# Patient Record
Sex: Female | Born: 1995 | Race: Black or African American | Hispanic: No | Marital: Single | Smoking: Never smoker
Health system: Southern US, Community
[De-identification: ages and names within clinical notes are randomized; demographics above are authoritative.]

## PROBLEM LIST (undated history)

## (undated) DIAGNOSIS — J45909 Unspecified asthma, uncomplicated: Secondary | ICD-10-CM

## (undated) DIAGNOSIS — K219 Gastro-esophageal reflux disease without esophagitis: Secondary | ICD-10-CM

## (undated) DIAGNOSIS — J302 Other seasonal allergic rhinitis: Secondary | ICD-10-CM

## (undated) DIAGNOSIS — H539 Unspecified visual disturbance: Secondary | ICD-10-CM

## (undated) DIAGNOSIS — F410 Panic disorder [episodic paroxysmal anxiety] without agoraphobia: Secondary | ICD-10-CM

## (undated) DIAGNOSIS — E669 Obesity, unspecified: Secondary | ICD-10-CM

## (undated) DIAGNOSIS — F329 Major depressive disorder, single episode, unspecified: Secondary | ICD-10-CM

## (undated) DIAGNOSIS — F32A Depression, unspecified: Secondary | ICD-10-CM

## (undated) HISTORY — PX: TONSILLECTOMY: SUR1361

## (undated) HISTORY — DX: Gastro-esophageal reflux disease without esophagitis: K21.9

## (undated) HISTORY — PX: ADENOIDECTOMY: SUR15

---
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
## 2016-02-03 ENCOUNTER — Other Ambulatory Visit: Payer: Self-pay

## 2016-02-03 ENCOUNTER — Emergency Department (HOSPITAL_BASED_OUTPATIENT_CLINIC_OR_DEPARTMENT_OTHER)
Admission: EM | Admit: 2016-02-03 | Discharge: 2016-02-04 | Disposition: A | Discharge status: Home / Self Care | Payer: Medicaid Other | Attending: Emergency Medicine | Admitting: Emergency Medicine

## 2016-02-03 ENCOUNTER — Emergency Department (HOSPITAL_BASED_OUTPATIENT_CLINIC_OR_DEPARTMENT_OTHER): Payer: Medicaid Other

## 2016-02-03 ENCOUNTER — Encounter (HOSPITAL_BASED_OUTPATIENT_CLINIC_OR_DEPARTMENT_OTHER): Payer: Self-pay | Admitting: *Deleted

## 2016-02-03 DIAGNOSIS — F329 Major depressive disorder, single episode, unspecified: Secondary | ICD-10-CM | POA: Insufficient documentation

## 2016-02-03 DIAGNOSIS — R110 Nausea: Secondary | ICD-10-CM | POA: Diagnosis not present

## 2016-02-03 DIAGNOSIS — Z8669 Personal history of other diseases of the nervous system and sense organs: Secondary | ICD-10-CM | POA: Diagnosis not present

## 2016-02-03 DIAGNOSIS — Z79899 Other long term (current) drug therapy: Secondary | ICD-10-CM | POA: Diagnosis not present

## 2016-02-03 DIAGNOSIS — R079 Chest pain, unspecified: Secondary | ICD-10-CM | POA: Diagnosis present

## 2016-02-03 DIAGNOSIS — Z7951 Long term (current) use of inhaled steroids: Secondary | ICD-10-CM | POA: Diagnosis not present

## 2016-02-03 DIAGNOSIS — Z973 Presence of spectacles and contact lenses: Secondary | ICD-10-CM | POA: Insufficient documentation

## 2016-02-03 DIAGNOSIS — F410 Panic disorder [episodic paroxysmal anxiety] without agoraphobia: Secondary | ICD-10-CM | POA: Diagnosis not present

## 2016-02-03 DIAGNOSIS — J45901 Unspecified asthma with (acute) exacerbation: Secondary | ICD-10-CM | POA: Insufficient documentation

## 2016-02-03 DIAGNOSIS — E669 Obesity, unspecified: Secondary | ICD-10-CM | POA: Insufficient documentation

## 2016-02-03 MED ORDER — IPRATROPIUM-ALBUTEROL 0.5-2.5 (3) MG/3ML IN SOLN
3.0000 mL | RESPIRATORY_TRACT | Status: DC
  Administered 2016-02-04: 3 mL via RESPIRATORY_TRACT
  Filled 2016-02-03: qty 3

## 2016-02-03 NOTE — ED Notes (Signed)
Chest pain in the center of her chest, sob, nausea. [place] she got here. She is ambulatory with no difficulty.

## 2016-02-03 NOTE — ED Provider Notes (Signed)
CSN: [id number]     Arrival date & time 02/03/16  2141 History  By signing my name below, I, Jamie Stevens, attest that this documentation has been prepared under the direction and in the presence of Paula Libra, MD. Electronically Signed: Doreatha Stevens, ED Scribe. 02/03/2016. 11:50 PM.    Chief Complaint  Patient presents with  . Chest Pain   The history is provided by the patient. No language interpreter was used.   HPI Comments: Jamie Stevens is a 20 y.o. female with h/o asthma who presents to the Emergency Department complaining of moderate, intermittent substernal chest pain onset at 10AM. Associated symptoms include nausea, generalized weakness and exertional SOB. Pt describes her pain as dull and 6/10 in severity. Pt states her pain is unaffected by breathing, but is mildly worsened with movement. Pt reports she does not currently feel short of breath or nauseated and is currently pain-free. She reports h/o similar symptoms with asthma, but not as severe. She states that she did not try to use her home inhaler with her current symptoms. She denies chills, vomiting, diarrhea, leg pain or swelling.   Past Medical History  Diagnosis Date  . Asthma   . Seasonal allergies   . Panic anxiety syndrome   . Depression   . Obesity   . Vision abnormalities     Pt wears glasses   Past Surgical History  Procedure Laterality Date  . Tonsillectomy    . Adenoidectomy     No family history on file. Social History  Substance Use Topics  . Smoking status: Passive Smoke Exposure - Never Smoker  . Smokeless tobacco: None  . Alcohol Use: No   OB History    No data available     Review of Systems A complete 10 system review of systems was obtained and all systems are negative except as noted in the HPI and PMH.    Allergies  Review of patient's allergies indicates no known allergies.  Home Medications   Prior to Admission medications   Medication Sig Start Date End Date Taking? Authorizing  Provider  albuterol (PROVENTIL HFA;VENTOLIN HFA) 108 (90 BASE) MCG/ACT inhaler Inhale 1-2 puffs into the lungs every 4 (four) hours as needed for wheezing or shortness of breath. 09/03/14   Gerhard Munch, MD  budesonide-formoterol [facility]) 160-4.5 MCG/ACT inhaler Inhale 2 puffs into the lungs 2 (two) times daily.    Historical Provider, MD  buPROPion (WELLBUTRIN XL) 300 MG 24 hr tablet Take 1 tablet (300 mg total) by mouth daily. 08/27/15   Thedora Hinders, MD  cetirizine (ZYRTEC) 10 MG tablet Take 10 mg by mouth daily.    Historical Provider, MD   BP 139/98 mmHg  Pulse 104  Temp(Src) 99 F (37.2 C) (Oral)  Resp 18  Ht  (1.702 m)  Wt 273 lb 7 oz (124.03 kg)  BMI 42.82 kg/m2  SpO2 100%  LMP 02/02/2016   Physical Exam General: Well-developed, well-nourished female in no acute distress; appearance consistent with age of record; obese  HENT: normocephalic; atraumatic Eyes: pupils equal, round and reactive to light; extraocular muscles intact Neck: supple Heart: regular rate and rhythm; no murmurs, rubs or gallops Lungs: diminished breath sounds bilaterally without wheezes Abdomen: soft; nondistended; nontender; no masses or hepatosplenomegaly; bowel sounds present Extremities: No deformity; full range of motion; pulses normal Neurologic: Awake, alert and oriented; motor function intact in all extremities and symmetric; no facial droop Skin: Warm and dry Psychiatric: Normal mood and affect  ED Course  Procedures (including critical care time) DIAGNOSTIC STUDIES: Oxygen Saturation is 99% on RA, normal by my interpretation.    COORDINATION OF CARE: 11:44 PM Discussed treatment plan with pt at bedside which includes EKG, breathing tx and pt agreed to plan.    EKG Interpretation   Date/Time:  Friday February 03 2016 21:57:42 EDT Ventricular Rate:  108 PR Interval:  142 QRS Duration: 96 QT Interval:  330 QTC Calculation: 442 R Axis:   24 Text Interpretation:   Sinus tachycardia Nonspecific T wave abnormality  Abnormal ECG No previous ECGs available Confirmed by Read DriversMOLPUS  MD, Jonny RuizJOHN  [phone]([id number]) on 02/03/2016 11:41:40 PM      MDM   Nursing notes and vitals signs, including pulse oximetry, reviewed.  Summary of this visit's results, reviewed by myself:  Labs:  Results for orders placed or performed during the hospital encounter of 02/03/16 (from the past 24 hour(s))  CBC with Differential/Platelet     Status: Abnormal   Collection Time: 02/04/16 12:29 AM  Result Value Ref Range   WBC 6.8 4.0 - 10.5 K/uL   RBC 4.02 3.87 - 5.11 MIL/uL   Hemoglobin 11.2 (L) 12.0 - 15.0 g/dL   HCT 60.434.0 (L) 54.036.0 - 98.146.0 %   MCV 84.6 78.0 - 100.0 fL   MCH 27.9 26.0 - 34.0 pg   MCHC 32.9 30.0 - 36.0 g/dL   RDW 19.113.1 47.811.5 - 29.515.5 %   Platelets 209 150 - 400 K/uL   Neutrophils Relative % 42 %   Neutro Abs 2.9 1.7 - 7.7 K/uL   Lymphocytes Relative 47 %   Lymphs Abs 3.2 0.7 - 4.0 K/uL   Monocytes Relative 10 %   Monocytes Absolute 0.7 0.1 - 1.0 K/uL   Eosinophils Relative 1 %   Eosinophils Absolute 0.1 0.0 - 0.7 K/uL   Basophils Relative 0 %   Basophils Absolute 0.0 0.0 - 0.1 K/uL  Basic metabolic panel     Status: Abnormal   Collection Time: 02/04/16 12:29 AM  Result Value Ref Range   Sodium 141 135 - 145 mmol/L   Potassium 3.6 3.5 - 5.1 mmol/L   Chloride 108 101 - 111 mmol/L   CO2 25 22 - 32 mmol/L   Glucose, Bld 102 (H) 65 - 99 mg/dL   BUN 10 6 - 20 mg/dL   Creatinine, Ser 6.210.92 0.44 - 1.00 mg/dL   Calcium 9.3 8.9 - 30.810.3 mg/dL   GFR calc non Af Amer >60 >60 mL/min   GFR calc Af Amer >60 >60 mL/min   Anion gap 8 5 - 15  Troponin I     Status: None   Collection Time: 02/04/16 12:29 AM  Result Value Ref Range   Troponin I <0.03 <0.031 ng/mL    Imaging Studies: Dg Chest 2 View  02/04/2016  CLINICAL DATA:  Substernal chest pain beginning at 10 a.m. EXAM: CHEST  2 VIEW COMPARISON:  Chest radiograph September 03, 2014 FINDINGS: Cardiomediastinal silhouette is  normal. The lungs are clear without pleural effusions or focal consolidations. Trachea projects midline and there is no pneumothorax. Soft tissue planes and included osseous structures are non-suspicious. Large body habitus. IMPRESSION: Normal chest. Electronically Signed   By: Awilda Metroourtnay  Bloomer M.D.   On: 02/04/2016 01:06   1:11 AM Air movement improved after DuoNeb treatment. Patient has remained pain-free in the ED.  I personally performed the services described in this documentation, which was scribed in my presence. The recorded information has been reviewed  and is accurate.    Paula Libra, MD 02/04/16 [phone]

## 2016-02-04 LAB — CBC WITH DIFFERENTIAL/PLATELET
Basophils Absolute: 0 10*3/uL (ref 0.0–0.1)
Basophils Relative: 0 %
Eosinophils Absolute: 0.1 10*3/uL (ref 0.0–0.7)
Eosinophils Relative: 1 %
HCT: 34 % — ABNORMAL LOW (ref 36.0–46.0)
Hemoglobin: 11.2 g/dL — ABNORMAL LOW (ref 12.0–15.0)
Lymphocytes Relative: 47 %
Lymphs Abs: 3.2 10*3/uL (ref 0.7–4.0)
MCH: 27.9 pg (ref 26.0–34.0)
MCHC: 32.9 g/dL (ref 30.0–36.0)
MCV: 84.6 fL (ref 78.0–100.0)
Monocytes Absolute: 0.7 10*3/uL (ref 0.1–1.0)
Monocytes Relative: 10 %
Neutro Abs: 2.9 10*3/uL (ref 1.7–7.7)
Neutrophils Relative %: 42 %
Platelets: 209 10*3/uL (ref 150–400)
RBC: 4.02 MIL/uL (ref 3.87–5.11)
RDW: 13.1 % (ref 11.5–15.5)
WBC: 6.8 10*3/uL (ref 4.0–10.5)

## 2016-02-04 LAB — BASIC METABOLIC PANEL
Anion gap: 8 (ref 5–15)
BUN: 10 mg/dL (ref 6–20)
CO2: 25 mmol/L (ref 22–32)
Calcium: 9.3 mg/dL (ref 8.9–10.3)
Chloride: 108 mmol/L (ref 101–111)
Creatinine, Ser: 0.92 mg/dL (ref 0.44–1.00)
GFR calc Af Amer: >60 mL/min (ref >60)
GFR calc non Af Amer: >60 mL/min (ref >60)
Glucose, Bld: 102 mg/dL — ABNORMAL HIGH (ref 65–99)
Potassium: 3.6 mmol/L (ref 3.5–5.1)
Sodium: 141 mmol/L (ref 135–145)

## 2016-02-04 LAB — TROPONIN I: Troponin I: <0.03 ng/mL (ref <0.031)

## 2016-02-04 MED ORDER — ALBUTEROL SULFATE HFA 108 (90 BASE) MCG/ACT IN AERS
2.0000 | INHALATION_SPRAY | RESPIRATORY_TRACT | Status: DC | PRN
  Administered 2016-02-04: 2 via RESPIRATORY_TRACT
  Filled 2016-02-04: qty 6.7

## 2016-02-04 NOTE — Discharge Instructions (Signed)
Asthma, Adult Asthma is a recurring condition in which the airways tighten and narrow. Asthma can make it difficult to breathe. It can cause coughing, wheezing, and shortness of breath. Asthma episodes, also called asthma attacks, range from minor to life-threatening. Asthma cannot be cured, but medicines and lifestyle changes can help control it. CAUSES Asthma is believed to be caused by inherited (genetic) and environmental factors, but its exact cause is unknown. Asthma may be triggered by allergens, lung infections, or irritants in the air. Asthma triggers are different for each person. Common triggers include:   Animal dander.  Dust mites.  Cockroaches.  Pollen from trees or grass.  Mold.  Smoke.  Air pollutants such as dust, household cleaners, hair sprays, aerosol sprays, paint fumes, strong chemicals, or strong odors.  Cold air, weather changes, and winds (which increase molds and pollens in the air).  Strong emotional expressions such as crying or laughing hard.  Stress.  Certain medicines (such as aspirin) or types of drugs (such as beta-blockers).  Sulfites in foods and drinks. Foods and drinks that may contain sulfites include dried fruit, potato chips, and sparkling grape juice.  Infections or inflammatory conditions such as the flu, a cold, or an inflammation of the nasal membranes (rhinitis).  Gastroesophageal reflux disease (GERD).  Exercise or strenuous activity. SYMPTOMS Symptoms may occur immediately after asthma is triggered or many hours later. Symptoms include:  Wheezing.  Excessive nighttime or early morning coughing.  Frequent or severe coughing with a common cold.  Chest tightness.  Shortness of breath. DIAGNOSIS  The diagnosis of asthma is made by a review of your medical history and a physical exam. Tests may also be performed. These may include:  Lung function studies. These tests show how much air you breathe in and out.  Allergy  tests.  Imaging tests such as X-rays. TREATMENT  Asthma cannot be cured, but it can usually be controlled. Treatment involves identifying and avoiding your asthma triggers. It also involves medicines. There are 2 classes of medicine used for asthma treatment:   Controller medicines. These prevent asthma symptoms from occurring. They are usually taken every day.  Reliever or rescue medicines. These quickly relieve asthma symptoms. They are used as needed and provide short-term relief. Your health care provider will help you create an asthma action plan. An asthma action plan is a written plan for managing and treating your asthma attacks. It includes a list of your asthma triggers and how they may be avoided. It also includes information on when medicines should be taken and when their dosage should be changed. An action plan may also involve the use of a device called a peak flow meter. A peak flow meter measures how well the lungs are working. It helps you monitor your condition. HOME CARE INSTRUCTIONS   Take medicines only as directed by your health care provider. Speak with your health care provider if you have questions about how or when to take the medicines.  Use a peak flow meter as directed by your health care provider. Record and keep track of readings.  Understand and use the action plan to help minimize or stop an asthma attack without needing to seek medical care.  Control your home environment in the following ways to help prevent asthma attacks:  Do not smoke. Avoid being exposed to secondhand smoke.  Change your heating and air conditioning filter regularly.  Limit your use of fireplaces and wood stoves.  Get rid of pests (such as roaches   and mice) and their droppings.  Throw away plants if you see mold on them.  Clean your floors and dust regularly. Use unscented cleaning products.  Try to have someone else vacuum for you regularly. Stay out of rooms while they are  being vacuumed and for a short while afterward. If you vacuum, use a dust mask from a hardware store, a double-layered or microfilter vacuum cleaner bag, or a vacuum cleaner with a HEPA filter.  Replace carpet with wood, tile, or vinyl flooring. Carpet can trap dander and dust.  Use allergy-proof pillows, mattress covers, and box spring covers.  Wash bed sheets and blankets every week in hot water and dry them in a dryer.  Use blankets that are made of polyester or cotton.  Clean bathrooms and kitchens with bleach. If possible, have someone repaint the walls in these rooms with mold-resistant paint. Keep out of the rooms that are being cleaned and painted.  Wash hands frequently. SEEK MEDICAL CARE IF:   You have wheezing, shortness of breath, or a cough even if taking medicine to prevent attacks.  The colored mucus you cough up (sputum) is thicker than usual.  Your sputum changes from clear or white to yellow, green, gray, or bloody.  You have any problems that may be related to the medicines you are taking (such as a rash, itching, swelling, or trouble breathing).  You are using a reliever medicine more than 2-3 times per week.  Your peak flow is still at 50-79% of your personal best after following your action plan for 1 hour.  You have a fever. SEEK IMMEDIATE MEDICAL CARE IF:   You seem to be getting worse and are unresponsive to treatment during an asthma attack.  You are short of breath even at rest.  You get short of breath when doing very little physical activity.  You have difficulty eating, drinking, or talking due to asthma symptoms.  You develop chest pain.  You develop a fast heartbeat.  You have a bluish color to your lips or fingernails.  You are light-headed, dizzy, or faint.  Your peak flow is less than 50% of your personal best.   This information is not intended to replace advice given to you by your health care provider. Make sure you discuss any  questions you have with your health care provider.   Document Released: 10/15/2005 Document Revised: 07/06/2015 Document Reviewed: 05/14/2013 Elsevier Interactive Patient Education 2016 [employer]  

## 2018-01-28 ENCOUNTER — Emergency Department (HOSPITAL_BASED_OUTPATIENT_CLINIC_OR_DEPARTMENT_OTHER)
Admission: EM | Admit: 2018-01-28 | Discharge: 2018-01-28 | Disposition: A | Discharge status: Home / Self Care | Payer: Self-pay | Attending: Emergency Medicine | Admitting: Emergency Medicine

## 2018-01-28 ENCOUNTER — Encounter (HOSPITAL_BASED_OUTPATIENT_CLINIC_OR_DEPARTMENT_OTHER): Payer: Self-pay | Admitting: Emergency Medicine

## 2018-01-28 ENCOUNTER — Other Ambulatory Visit: Payer: Self-pay

## 2018-01-28 ENCOUNTER — Emergency Department (HOSPITAL_BASED_OUTPATIENT_CLINIC_OR_DEPARTMENT_OTHER): Payer: Self-pay

## 2018-01-28 DIAGNOSIS — R059 Cough, unspecified: Secondary | ICD-10-CM

## 2018-01-28 DIAGNOSIS — R05 Cough: Secondary | ICD-10-CM | POA: Insufficient documentation

## 2018-01-28 DIAGNOSIS — Z79899 Other long term (current) drug therapy: Secondary | ICD-10-CM | POA: Insufficient documentation

## 2018-01-28 DIAGNOSIS — J069 Acute upper respiratory infection, unspecified: Secondary | ICD-10-CM

## 2018-01-28 DIAGNOSIS — R0981 Nasal congestion: Secondary | ICD-10-CM | POA: Insufficient documentation

## 2018-01-28 DIAGNOSIS — J45909 Unspecified asthma, uncomplicated: Secondary | ICD-10-CM | POA: Insufficient documentation

## 2018-01-28 DIAGNOSIS — Z7722 Contact with and (suspected) exposure to environmental tobacco smoke (acute) (chronic): Secondary | ICD-10-CM | POA: Insufficient documentation

## 2018-01-28 DIAGNOSIS — J3489 Other specified disorders of nose and nasal sinuses: Secondary | ICD-10-CM | POA: Insufficient documentation

## 2018-01-28 NOTE — ED Triage Notes (Signed)
Pt c/o body aches and facial pressure since yesterday; sts just doesn't feel good.

## 2018-01-28 NOTE — Discharge Instructions (Signed)
Continue to stay well-hydrated. Gargle warm salt water and spit it out and use chloraseptic spray as needed for sore throat. Continue to alternate between Tylenol and Ibuprofen for pain or fever. Use Mucinex for cough suppression/expectoration of mucus. Use netipot and flonase to help with nasal congestion. May consider over-the-counter Benadryl or other antihistamine to decrease secretions and for help with your symptoms. Follow up with your primary care doctor in 5-7 days for recheck of ongoing symptoms. Return to emergency department for emergent changing or worsening of symptoms. °  °

## 2018-01-28 NOTE — ED Provider Notes (Signed)
[facility] EMERGENCY DEPARTMENT Provider Note   CSN: [id number] Arrival date & time: 01/28/18  1102     History   Chief Complaint Chief Complaint  Patient presents with  . Generalized Body Aches    HPI Jamie Stevens is a 22 y.o. female with a PMHx of asthma and seasonal allergies, who presents to the ED with complaints of URI symptoms x 1 day.  Symptoms include body aches, facial pressure/congestion, rhinorrhea, cough with yellow sputum production, and sore throat.  She has tried Benadryl and DayQuil with no relief of her symptoms, and being cold seems to aggravate her symptoms.  No known sick contacts, she does not think that she received the flu shot this year either.  She is a non-smoker.  She denies any drooling, trismus, ear pain or drainage, fevers, chills, CP, SOB, abd pain, N/V/D/C, hematuria, dysuria, arthralgias, numbness, tingling, focal weakness, rashes, or any other complaints at this time.   The history is provided by the patient and medical records. No language interpreter was used.  URI   This is a new problem. The current episode started yesterday. The problem has not changed since onset.There has been no fever. Associated symptoms include congestion, rhinorrhea, sore throat and cough. Pertinent negatives include no chest pain, no abdominal pain, no diarrhea, no nausea, no vomiting, no dysuria, no ear pain and no rash. She has tried other medications for the symptoms. The treatment provided no relief.    Past Medical History:  Diagnosis Date  . Asthma   . Depression   . Obesity   . Panic anxiety syndrome   . Seasonal allergies   . Vision abnormalities    Pt wears glasses    Patient Active Problem List   Diagnosis Date Noted  . 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 MDD (major depressive disorder) 08/22/2015    Past Surgical History:  Procedure Laterality Date  . ADENOIDECTOMY    . TONSILLECTOMY       OB History   None       Home Medications    Prior to Admission medications   Medication Sig Start Date End Date Taking? Authorizing Provider  albuterol (PROVENTIL HFA;VENTOLIN HFA) 108 (90 BASE) MCG/ACT inhaler Inhale 1-2 puffs into the lungs every 4 (four) hours as needed for wheezing or shortness of breath. 09/03/14   Gerhard MunchLockwood, Robert, MD  budesonide-formoterol [facility](SYMBICORT) 160-4.5 MCG/ACT inhaler Inhale 2 puffs into the lungs 2 (two) times daily.    [provider]  buPROPion (WELLBUTRIN XL) 300 MG 24 hr tablet Take 1 tablet (300 mg total) by mouth daily. 08/27/15   Thedora HindersSevilla Saez-Benito, Miriam, MD  cetirizine (ZYRTEC) 10 MG tablet Take 10 mg by mouth daily.    [provider]    Family History No family history on file.  Social History Social History   Tobacco Use  . Smoking status: Passive Smoke Exposure - Never Smoker  . Smokeless tobacco: Never Used  Substance Use Topics  . Alcohol use: No  . Drug use: No     Allergies   Patient has no known allergies.   Review of Systems Review of Systems  Constitutional: Negative for chills and fever.  HENT: Positive for congestion, rhinorrhea, sinus pressure and sore throat. Negative for drooling, ear discharge, ear pain and trouble swallowing.   Respiratory: Positive for cough. Negative for shortness of breath.   Cardiovascular: Negative for chest pain.  Gastrointestinal: Negative for abdominal pain, constipation, diarrhea, nausea and vomiting.  Genitourinary:  Negative for dysuria and hematuria.  Musculoskeletal: Positive for myalgias. Negative for arthralgias.  Skin: Negative for rash.  Allergic/Immunologic: Negative for immunocompromised state.  Neurological: Negative for weakness and numbness.  Psychiatric/Behavioral: Negative for confusion.   All other systems reviewed and are negative for acute change except as noted in the HPI.    Physical Exam Updated Vital Signs BP 132/88 (BP Location: Right Arm)   Pulse 97   Temp  98.9 F (37.2 C) (Oral)   Resp 20   Ht 5\' 6"  (1.676 m)   Wt 122.5 kg (270 lb)   LMP 01/16/2018   SpO2 96%   BMI 43.58 kg/m   Physical Exam  Constitutional: She is oriented to person, place, and time. Vital signs are normal. She appears well-developed and well-nourished.  Non-toxic appearance. No distress.  Afebrile, nontoxic, NAD  HENT:  Head: Normocephalic and atraumatic.  Nose: Mucosal edema and rhinorrhea present.  Mouth/Throat: Uvula is midline, oropharynx is clear and moist and mucous membranes are normal. No trismus in the jaw. No uvula swelling. Tonsils are 0 on the right. Tonsils are 0 on the left. No tonsillar exudate.  Nose congested. Oropharynx clear and moist, without uvular swelling or deviation, no trismus or drooling, tonsils surgically absent, no exudates.    Eyes: Conjunctivae and EOM are normal. Right eye exhibits no discharge. Left eye exhibits no discharge.  Neck: Normal range of motion. Neck supple.  Cardiovascular: Normal rate, regular rhythm, normal heart sounds and intact distal pulses. Exam reveals no gallop and no friction rub.  No murmur heard. Pulmonary/Chest: Effort normal and breath sounds normal. No respiratory distress. She has no decreased breath sounds. She has no wheezes. She has no rhonchi. She has no rales.  CTAB in all lung fields, no w/r/r, no hypoxia or increased WOB, speaking in full sentences, SpO2 96% on RA   Abdominal: Soft. Normal appearance and bowel sounds are normal. She exhibits no distension. There is no tenderness. There is no rigidity, no rebound, no guarding, no CVA tenderness, no tenderness at McBurney's point and negative Murphy's sign.  Musculoskeletal: Normal range of motion.  Neurological: She is alert and oriented to person, place, and time. She has normal strength. No sensory deficit.  Skin: Skin is warm, dry and intact. No rash noted.  Psychiatric: She has a normal mood and affect.  Nursing note and vitals reviewed.    ED  Treatments / Results  Labs (all labs ordered are listed, but only abnormal results are displayed) Labs Reviewed - No data to display  EKG None  Radiology Dg Chest 2 View  Result Date: 01/28/2018 CLINICAL DATA:  Body aches beginning yesterday. EXAM: CHEST - 2 VIEW COMPARISON:  02/04/2016 FINDINGS: Heart size is normal. Mediastinal shadows are normal. The lungs are clear. No bronchial thickening. No infiltrate, mass, effusion or collapse. Pulmonary vascularity is normal. No bony abnormality. IMPRESSION: Normal chest Electronically Signed   By: Paulina Fusi M.D.   On: 01/28/2018 12:52    Procedures Procedures (including critical care time)  Medications Ordered in ED Medications - No data to display   Initial Impression / Assessment and Plan / ED Course  I have reviewed the triage vital signs and the nursing notes.  Pertinent labs & imaging results that were available during my care of the patient were reviewed by me and considered in my medical decision making (see chart for details).     22 y.o. female here with URI symptoms x1 day, on exam pt afebrile  and nontoxic, clear lung exam, mild nasal congestion and rhinorrhea, throat clear. Could be flu, although no fever; will get CXR to eval for PNA/bronchitis, and reassess shortly.   1:26 PM CXR negative for PNA/bronchitis. Likely viral URI, could be flu although no fevers present so less likely, doubt tamiflu would be of benefit in this situation; pt agreeable with this plan. Advised OTC remedies for symptomatic relief, and f/up with PCP in 1wk for recheck. I explained the diagnosis and have given explicit precautions to return to the ER including for any other new or worsening symptoms. The patient understands and accepts the medical plan as it's been dictated and I have answered their questions. Discharge instructions concerning home care and prescriptions have been given. The patient is STABLE and is discharged to home in good condition.      Final Clinical Impressions(s) / ED Diagnoses   Final diagnoses:  Upper respiratory tract infection, unspecified type  Cough    ED Discharge Orders    [address] [place], [place] 01/28/18 1326    Loren Racer, MD 01/29/18 0730

## 2018-06-01 IMAGING — DX DG CHEST 2V
2 series · 2 of 2 positions shown · non-contrast
Comparison: 02/04/2016

CLINICAL DATA: Body aches beginning yesterday.

EXAM:
CHEST - 2 VIEW

[chest pa]
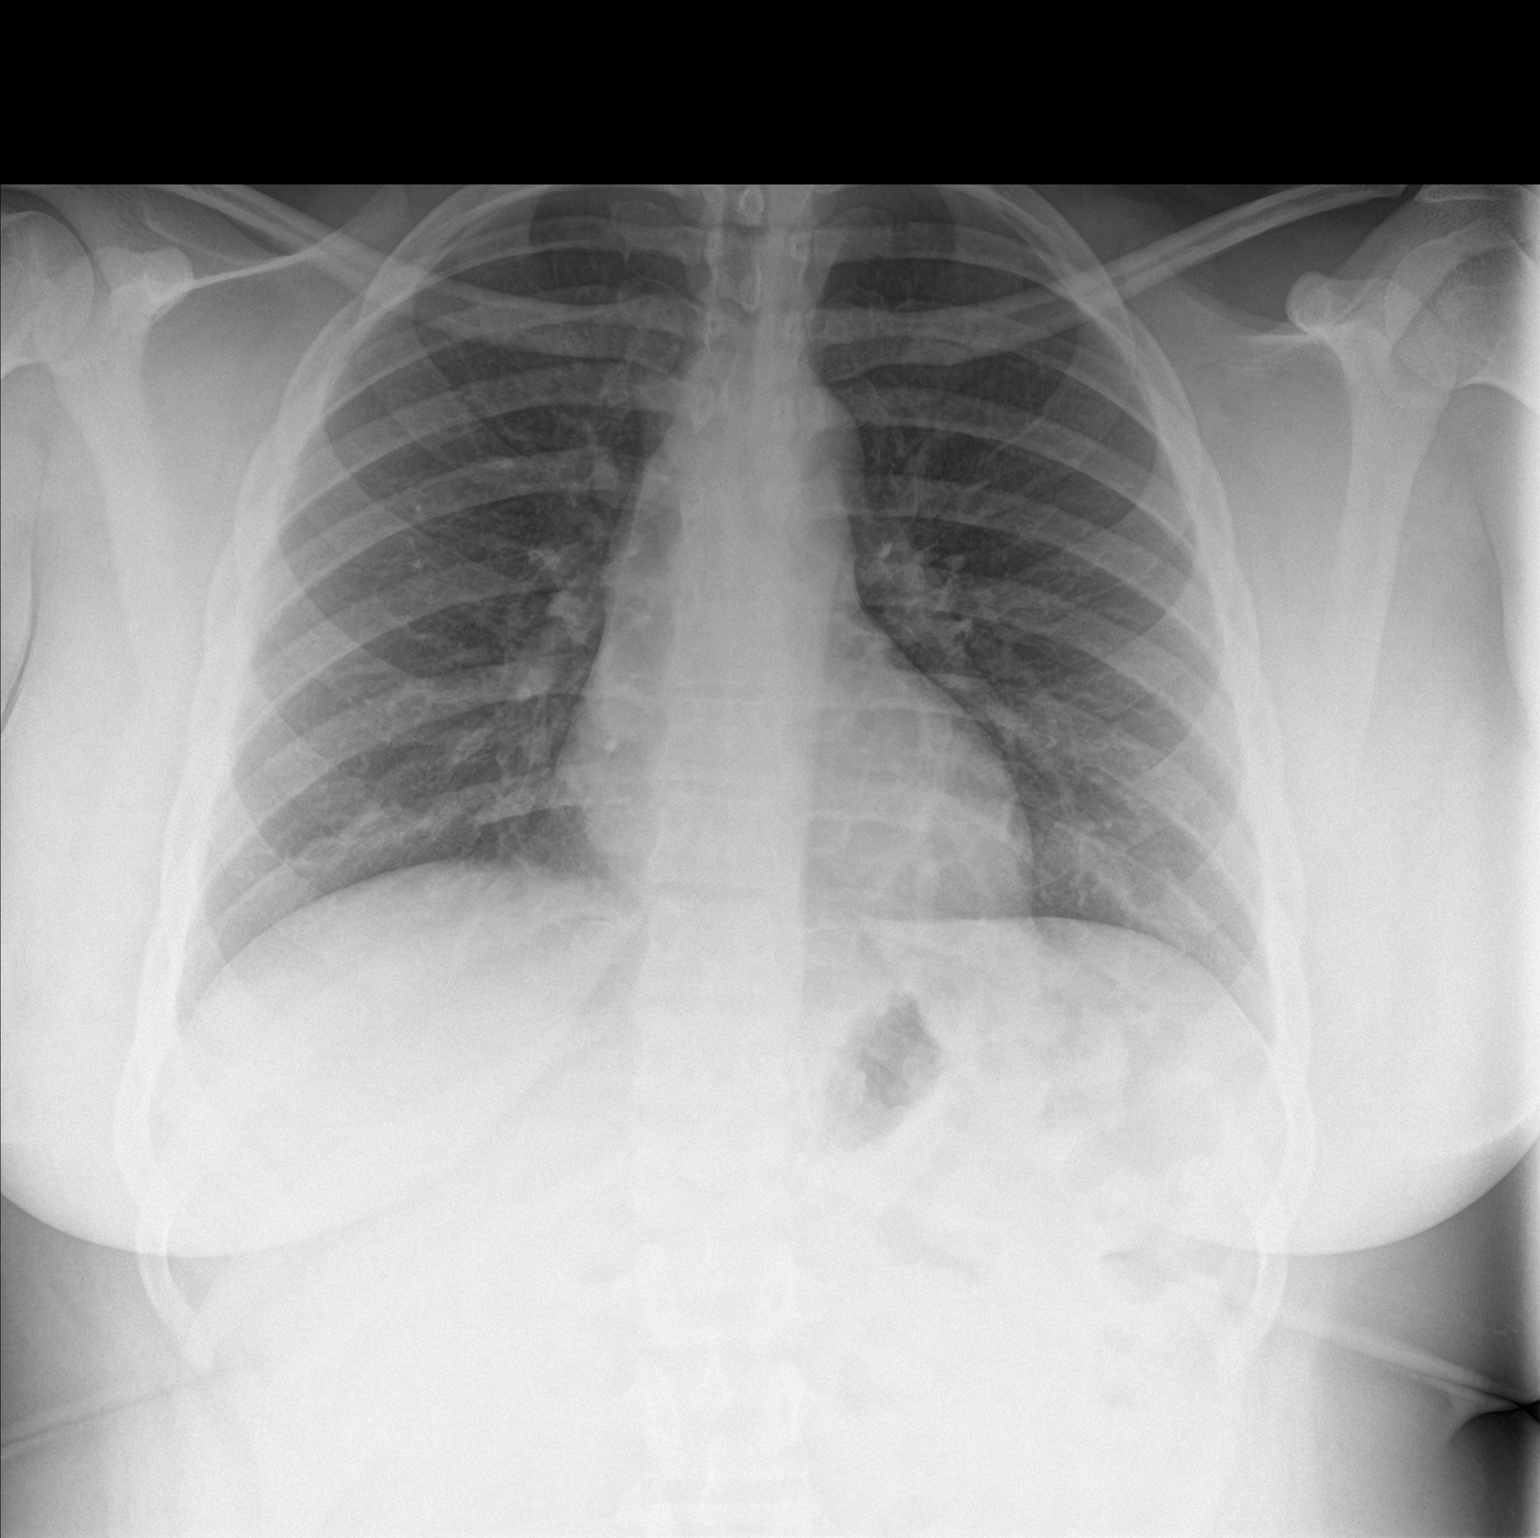

[chest lat]
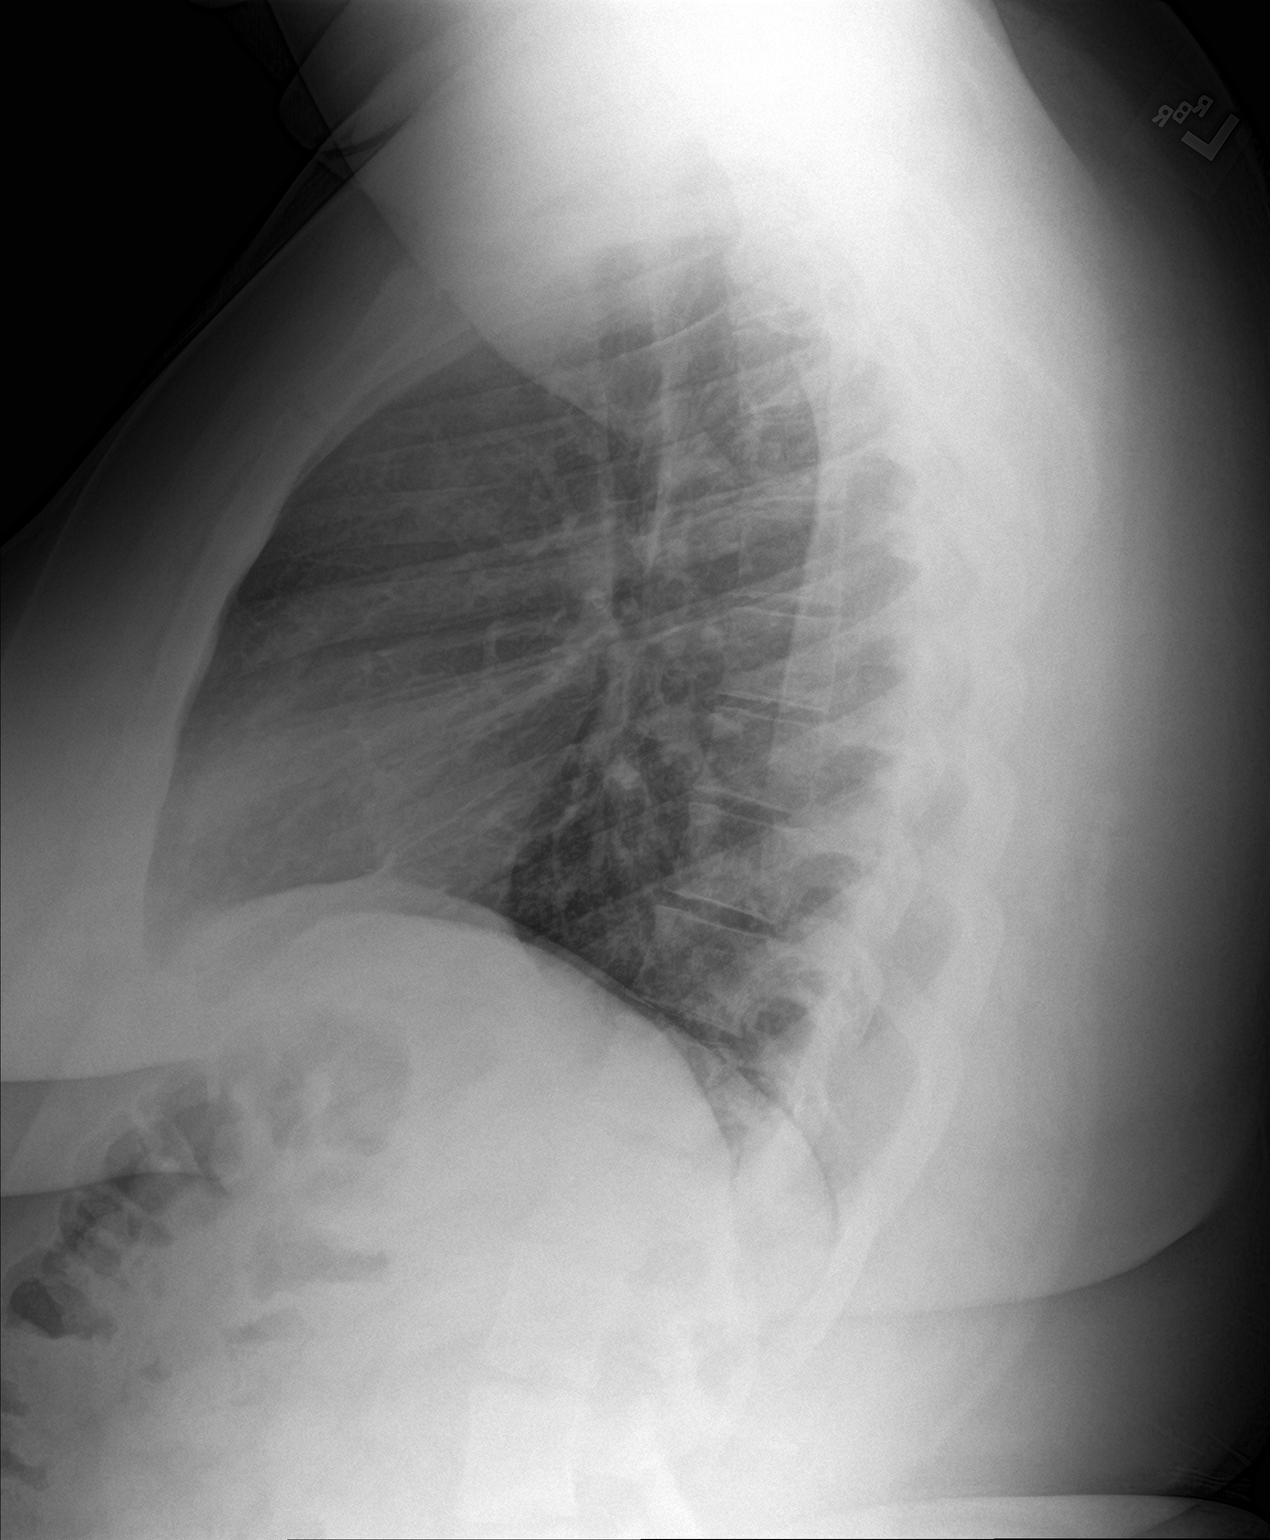

[2 of 2 positions shown; findings below may reference images not displayed]

FINDINGS: Heart size is normal. Mediastinal shadows are normal. The lungs are
clear. No bronchial thickening. No infiltrate, mass, effusion or
collapse. Pulmonary vascularity is normal. No bony abnormality.
IMPRESSION: Normal chest

## 2018-12-31 ENCOUNTER — Ambulatory Visit
Admission: EM | Admit: 2018-12-31 | Discharge: 2018-12-31 | Disposition: A | Discharge status: Home / Self Care | Payer: BLUE CROSS/BLUE SHIELD | Attending: Physician Assistant | Admitting: Physician Assistant

## 2018-12-31 DIAGNOSIS — B349 Viral infection, unspecified: Secondary | ICD-10-CM | POA: Diagnosis not present

## 2018-12-31 MED ORDER — ALBUTEROL SULFATE HFA 108 (90 BASE) MCG/ACT IN AERS: 1.0000 | INHALATION_SPRAY | RESPIRATORY_TRACT | 0 refills | Status: DC | PRN

## 2018-12-31 MED ORDER — FLUTICASONE PROPIONATE 50 MCG/ACT NA SUSP: 2.0000 | Freq: Every day | NASAL | 0 refills | Status: DC

## 2018-12-31 MED ORDER — BENZONATATE 100 MG PO CAPS: 100.0000 mg | ORAL_CAPSULE | Freq: Three times a day (TID) | ORAL | 0 refills | Status: DC

## 2018-12-31 MED ORDER — IPRATROPIUM BROMIDE 0.06 % NA SOLN: 2.0000 | Freq: Four times a day (QID) | NASAL | 12 refills | Status: DC

## 2018-12-31 NOTE — ED Provider Notes (Signed)
[facility]    CSN: [id number] Arrival date & time: 12/31/18  1123     History   Chief Complaint Chief Complaint  Patient presents with  . Nasal Congestion    HPI Jamie Stevens is a 23 y.o. female.   23 year old female comes in for 4 day history of URI symptoms. Sore throat, nasal congestion, sinus pressure. Now with cough, short of breath, chest congestion. States has to take more deep breaths while she is talking.  Denies fever, chills, night sweats.  Has still been eating and drinking without difficulty.  OTC cold medication without relief.  States has history of asthma, does not have any inhalers.  She was once on Symbicort, but self discontinued.     Past Medical History:  Diagnosis Date  . Asthma   . Depression   . Obesity   . Panic anxiety syndrome   . Seasonal allergies   . Vision abnormalities    Pt wears glasses    Patient Active Problem List   Diagnosis Date Noted  . 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 MDD (major depressive disorder) 08/22/2015    Past Surgical History:  Procedure Laterality Date  . ADENOIDECTOMY    . TONSILLECTOMY      OB History   No obstetric history on file.      Home Medications    Prior to Admission medications   Medication Sig Start Date End Date Taking? Authorizing Provider  albuterol (PROVENTIL HFA;VENTOLIN HFA) 108 (90 Base) MCG/ACT inhaler Inhale 1-2 puffs into the lungs every 4 (four) hours as needed for wheezing or shortness of breath. 12/31/18   Cathie Hoops,  V, PA-C  benzonatate (TESSALON) 100 MG capsule Take 1 capsule (100 mg total) by mouth every 8 (eight) hours. 12/31/18   Cathie Hoops,  V, PA-C  budesonide-formoterol (SYMBICORT) 160-4.5 MCG/ACT inhaler Inhale 2 puffs into the lungs 2 (two) times daily.    [provider]  buPROPion (WELLBUTRIN XL) 300 MG 24 hr tablet Take 1 tablet (300 mg total) by mouth daily. 08/27/15   Thedora Hinders, MD  fluticasone (FLONASE) 50 MCG/ACT  nasal spray Place 2 sprays into both nostrils daily. 12/31/18   Cathie Hoops,  V, PA-C  ipratropium (ATROVENT) 0.06 % nasal spray Place 2 sprays into both nostrils 4 (four) times daily. 12/31/18   Belinda Fisher, PA-C    Family History No family history on file.  Social History Social History   Tobacco Use  . Smoking status: Passive Smoke Exposure - Never Smoker  . Smokeless tobacco: Never Used  Substance Use Topics  . Alcohol use: No  . Drug use: No     Allergies   Patient has no known allergies.   Review of Systems Review of Systems  Reason unable to perform ROS: See HPI as above.     Physical Exam Triage Vital Signs ED Triage Vitals [12/31/18 1203]  Enc Vitals Group     BP (!) 143/81     Pulse Rate 73     Resp 18     Temp 98.5 F (36.9 C)     Temp Source Oral     SpO2 98 %     Weight      Height      Head Circumference      Peak Flow      Pain Score 0     Pain Loc      Pain Edu?      Excl. in GC?  No data found.  Updated Vital Signs BP (!) 143/81 (BP Location: Left Arm)   Pulse 73   Temp 98.5 F (36.9 C) (Oral)   Resp 18   LMP 12/31/2018   SpO2 98%   Physical Exam Constitutional:      General: She is not in acute distress.    Appearance: She is well-developed. She is not ill-appearing, toxic-appearing or diaphoretic.  HENT:     Head: Normocephalic and atraumatic.     Right Ear: Tympanic membrane, ear canal and external ear normal. Tympanic membrane is not erythematous or bulging.     Left Ear: Tympanic membrane, ear canal and external ear normal. Tympanic membrane is not erythematous or bulging.     Nose: Rhinorrhea present.     Right Sinus: No maxillary sinus tenderness or frontal sinus tenderness.     Left Sinus: No maxillary sinus tenderness or frontal sinus tenderness.     Mouth/Throat:     Mouth: Mucous membranes are moist.     Pharynx: Oropharynx is clear. Uvula midline.  Eyes:     Conjunctiva/sclera: Conjunctivae normal.     Pupils: Pupils are  equal, round, and reactive to light.  Neck:     Musculoskeletal: Normal range of motion and neck supple.  Cardiovascular:     Rate and Rhythm: Normal rate and regular rhythm.     Heart sounds: Normal heart sounds. No murmur. No friction rub. No gallop.   Pulmonary:     Effort: Pulmonary effort is normal. No accessory muscle usage, prolonged expiration, respiratory distress or retractions.     Breath sounds: Normal breath sounds. No stridor, decreased air movement or transmitted upper airway sounds. No decreased breath sounds, wheezing, rhonchi or rales.  Skin:    General: Skin is warm and dry.  Neurological:     Mental Status: She is alert and oriented to person, place, and time.      UC Treatments / Results  Labs (all labs ordered are listed, but only abnormal results are displayed) Labs Reviewed - No data to display  EKG None  Radiology No results found.  Procedures Procedures (including critical care time)  Medications Ordered in UC Medications - No data to display  Initial Impression / Assessment and Plan / UC Course  I have reviewed the triage vital signs and the nursing notes.  Pertinent labs & imaging results that were available during my care of the patient were reviewed by me and considered in my medical decision making (see chart for details).    Discussed with patient history and exam most consistent with viral URI. Symptomatic treatment as needed. Push fluids. Return precautions given.   Final Clinical Impressions(s) / UC Diagnoses   Final diagnoses:  Viral illness    ED Prescriptions    Medication Sig Dispense Auth. Provider   albuterol (PROVENTIL HFA;VENTOLIN HFA) 108 (90 Base) MCG/ACT inhaler Inhale 1-2 puffs into the lungs every 4 (four) hours as needed for wheezing or shortness of breath. 3.7 g ,  V, PA-C   ipratropium (ATROVENT) 0.06 % nasal spray Place 2 sprays into both nostrils 4 (four) times daily. 15 mL ,  V, PA-C   fluticasone  (FLONASE) 50 MCG/ACT nasal spray Place 2 sprays into both nostrils daily. 1 g ,  V, PA-C   benzonatate (TESSALON) 100 MG capsule Take 1 capsule (100 mg total) by mouth every 8 (eight) hours. 21 capsule Cathie Hoops,  V, PA-C        Cathie Hoops  V, PA-C  12/31/18 1235  

## 2018-12-31 NOTE — ED Triage Notes (Signed)
Pt c/o nasal/head/chest congestion since Monday, worse today, having SOB

## 2018-12-31 NOTE — Discharge Instructions (Signed)
Tessalon for cough. Albuterol as needed for shortness of breath, wheezing. Start flonase, atrovent nasal spray for nasal congestion/drainage. You can use over the counter nasal saline rinse such as neti pot for nasal congestion. Keep hydrated, your urine should be clear to pale yellow in color. Tylenol/motrin for fever and pain. Monitor for any worsening of symptoms, chest pain, shortness of breath, wheezing, swelling of the throat, follow up for reevaluation.

## 2020-12-11 ENCOUNTER — Ambulatory Visit
Admission: EM | Admit: 2020-12-11 | Discharge: 2020-12-11 | Disposition: A | Discharge status: Home / Self Care | Payer: 59 | Attending: Family Medicine | Admitting: Family Medicine

## 2020-12-11 ENCOUNTER — Other Ambulatory Visit: Payer: Self-pay

## 2020-12-11 DIAGNOSIS — M25562 Pain in left knee: Secondary | ICD-10-CM | POA: Diagnosis present

## 2020-12-11 DIAGNOSIS — M25561 Pain in right knee: Secondary | ICD-10-CM | POA: Diagnosis not present

## 2020-12-11 DIAGNOSIS — N760 Acute vaginitis: Secondary | ICD-10-CM

## 2020-12-11 DIAGNOSIS — R5383 Other fatigue: Secondary | ICD-10-CM | POA: Diagnosis present

## 2020-12-11 DIAGNOSIS — L02415 Cutaneous abscess of right lower limb: Secondary | ICD-10-CM

## 2020-12-11 DIAGNOSIS — R630 Anorexia: Secondary | ICD-10-CM

## 2020-12-11 LAB — POCT URINALYSIS DIP (MANUAL ENTRY)
Glucose, UA: NEGATIVE mg/dL
Ketones, POC UA: NEGATIVE mg/dL
Leukocytes, UA: NEGATIVE
Nitrite, UA: NEGATIVE
Protein Ur, POC: 100 mg/dL — AB
Spec Grav, UA: >=1.03 — AB (ref 1.010–1.025)
Urobilinogen, UA: 0.2 E.U./dL
pH, UA: 5.5 (ref 5.0–8.0)

## 2020-12-11 MED ORDER — NAPROXEN 500 MG PO TABS: 500.0000 mg | ORAL_TABLET | Freq: Two times a day (BID) | ORAL | 0 refills | Status: DC | PRN

## 2020-12-11 MED ORDER — CEPHALEXIN 500 MG PO CAPS: 500.0000 mg | ORAL_CAPSULE | Freq: Two times a day (BID) | ORAL | 0 refills | Status: DC

## 2020-12-11 MED ORDER — HIBICLENS 4 % EX LIQD: Freq: Every day | CUTANEOUS | 0 refills | Status: DC | PRN

## 2020-12-11 MED ORDER — FLUCONAZOLE 150 MG PO TABS: 150.0000 mg | ORAL_TABLET | Freq: Once | ORAL | 0 refills | Status: AC

## 2020-12-11 NOTE — ED Provider Notes (Signed)
[facility]    CSN: [id number] Arrival date & time: 12/11/20  1233      History   Chief Complaint Chief Complaint  Patient presents with  . Leg Pain    Bilaterally x 4 days  . Groin Swelling    X 4 days    HPI Jamie Stevens is a 25 y.o. female.   Here today with leg aching diffusely, hip pain, lower back back, fatigue for about 4 days now. Also having a mass in right groin area that is growing and painful. Denies fever, chills, abdominal pain, N/V/D, CP, SOB. Has been having some vaginal discharge as well but does not feel this is related to the other sxs. Not trying anything OTC for any of these sxs. No known hx of similar issues regarding her diffuse body aches.      Past Medical History:  Diagnosis Date  . Asthma   . Depression   . Obesity   . Panic anxiety syndrome   . Seasonal allergies   . Vision abnormalities    Pt wears glasses    Patient Active Problem List   Diagnosis Date Noted  . 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 MDD (major depressive disorder) 08/22/2015    Past Surgical History:  Procedure Laterality Date  . ADENOIDECTOMY    . TONSILLECTOMY      OB History   No obstetric history on file.      Home Medications    Prior to Admission medications   Medication Sig Start Date End Date Taking? Authorizing Provider  albuterol (PROVENTIL HFA;VENTOLIN HFA) 108 (90 Base) MCG/ACT inhaler Inhale 1-2 puffs into the lungs every 4 (four) hours as needed for wheezing or shortness of breath. 12/31/18  Yes Yu, Amy V, PA-C  cephALEXin (KEFLEX) 500 MG capsule Take 1 capsule (500 mg total) by mouth 2 (two) times daily. 12/11/20  Yes Particia Nearing, PA-C  chlorhexidine (HIBICLENS) 4 % external liquid Apply topically daily as needed. 12/11/20  Yes Particia Nearing, PA-C  naproxen (NAPROSYN) 500 MG tablet Take 1 tablet (500 mg total) by mouth 2 (two) times daily as needed. 12/11/20  Yes Particia Nearing, PA-C  benzonatate  (TESSALON) 100 MG capsule Take 1 capsule (100 mg total) by mouth every 8 (eight) hours. 12/31/18   Cathie Hoops, Amy V, PA-C  budesonide-formoterol (SYMBICORT) 160-4.5 MCG/ACT inhaler Inhale 2 puffs into the lungs 2 (two) times daily.    [provider]  buPROPion (WELLBUTRIN XL) 300 MG 24 hr tablet Take 1 tablet (300 mg total) by mouth daily. 08/27/15   Thedora Hinders, MD  fluticasone (FLONASE) 50 MCG/ACT nasal spray Place 2 sprays into both nostrils daily. 12/31/18   Cathie Hoops, Amy V, PA-C  ipratropium (ATROVENT) 0.06 % nasal spray Place 2 sprays into both nostrils 4 (four) times daily. 12/31/18   Cathie Hoops, Amy V, PA-C  metroNIDAZOLE (FLAGYL) 500 MG tablet Take 1 tablet (500 mg total) by mouth 2 (two) times daily. 12/12/20   Lamptey, Britta Mccreedy, MD    Family History History reviewed. No pertinent family history.  Social History Social History   Tobacco Use  . Smoking status: Passive Smoke Exposure - Never Smoker  . Smokeless tobacco: Never Used  Vaping Use  . Vaping Use: Never used  Substance Use Topics  . Alcohol use: No  . Drug use: No     Allergies   Patient has no known allergies.   Review of Systems Review of Systems PER HPI  Physical Exam Triage Vital Signs ED Triage Vitals  Enc Vitals Group     BP 12/11/20 1333 112/72     Pulse Rate 12/11/20 1333 80     Resp 12/11/20 1333 20     Temp 12/11/20 1333 98.2 F (36.8 C)     Temp Source 12/11/20 1333 Oral     SpO2 12/11/20 1333 98 %     Weight --      Height --      Head Circumference --      Peak Flow --      Pain Score 12/11/20 1408 0     Pain Loc --      Pain Edu? --      Excl. in GC? --    No data found.  Updated Vital Signs BP 112/72 (BP Location: Left Arm)   Pulse 80   Temp 98.2 F (36.8 C) (Oral)   Resp 20   LMP 12/06/2020 (Approximate)   SpO2 98%   Visual Acuity Right Eye Distance:   Left Eye Distance:   Bilateral Distance:    Right Eye Near:   Left Eye Near:    Bilateral Near:     Physical  Exam Vitals and nursing note reviewed. Exam conducted with a chaperone present.  Constitutional:      Appearance: Normal appearance. She is not ill-appearing.  HENT:     Head: Atraumatic.     Right Ear: Tympanic membrane normal.     Left Ear: Tympanic membrane normal.     Nose: Nose normal.     Mouth/Throat:     Mouth: Mucous membranes are moist.     Pharynx: Oropharynx is clear. No posterior oropharyngeal erythema.  Eyes:     Extraocular Movements: Extraocular movements intact.     Conjunctiva/sclera: Conjunctivae normal.  Cardiovascular:     Rate and Rhythm: Normal rate and regular rhythm.     Heart sounds: Normal heart sounds.  Pulmonary:     Effort: Pulmonary effort is normal.     Breath sounds: Normal breath sounds.  Abdominal:     General: Bowel sounds are normal. There is no distension.     Palpations: Abdomen is soft.     Tenderness: There is no abdominal tenderness. There is no right CVA tenderness, left CVA tenderness or guarding.  Genitourinary:    Vagina: Vaginal discharge present.  Musculoskeletal:        General: Normal range of motion.     Cervical back: Normal range of motion and neck supple.  Skin:    General: Skin is warm.     Comments: Small pustules forming in a group at right upper medial thigh/groin fold. No active drainage or large area of fluctuance  Neurological:     Mental Status: She is alert and oriented to person, place, and time.  Psychiatric:        Mood and Affect: Mood normal.        Thought Content: Thought content normal.        Judgment: Judgment normal.      UC Treatments / Results  Labs (all labs ordered are listed, but only abnormal results are displayed) Labs Reviewed  COMPREHENSIVE METABOLIC PANEL - Abnormal; Notable for the following components:      Result Value   CO2 19 (*)    All other components within normal limits   Narrative:    Performed at:  [address], [place], [place]  [id number] Lab  Director: Jolene Schimke MD, Phone:  [phone]  POCT URINALYSIS DIP (MANUAL ENTRY) - Abnormal; Notable for the following components:   Color, UA straw (*)    Clarity, UA hazy (*)    Bilirubin, UA small (*)    Spec Grav, UA >=1.030 (*)    Blood, UA trace-intact (*)    Protein Ur, POC =100 (*)    All other components within normal limits  CERVICOVAGINAL ANCILLARY ONLY - Abnormal; Notable for the following components:   Bacterial Vaginitis (gardnerella) Positive (*)    Trichomonas Positive (*)    All other components within normal limits  CBC WITH DIFFERENTIAL/PLATELET   Narrative:    Performed at:  [address] [place], [place]  [id number] Lab Director: Jolene Schimke MD, Phone:  [phone]    EKG   Radiology No results found.  Procedures Procedures (including critical care time)  Medications Ordered in UC Medications - No data to display  Initial Impression / Assessment and Plan / UC Course  I have reviewed the triage vital signs and the nursing notes.  Pertinent labs & imaging results that were available during my care of the patient were reviewed by me and considered in my medical decision making (see chart for details).     Sxs quite generalized and seem to not necessarily be all connected. Will send aptima swab, U/A for eval of vaginal discharge and treat with diflucan while waiting for results given appearance. COVID pcr, basic labs for generalized fatigue and body aches. Naproxen prn for this, fluids. Keflex and hibiclens for groin infection. Return for worsening sxs.   Final Clinical Impressions(s) / UC Diagnoses   Final diagnoses:  Decreased appetite  Arthralgia of both lower legs  Abscess of leg, right  Acute vaginitis   Discharge Instructions   None    ED Prescriptions    Medication Sig Dispense Auth. Provider   naproxen (NAPROSYN) 500 MG tablet Take 1 tablet (500 mg total) by mouth 2 (two) times daily as needed. 30 tablet  Particia Nearing, [place]   chlorhexidine (HIBICLENS) 4 % external liquid Apply topically daily as needed. 120 mL Particia Nearing, PA-C   cephALEXin (KEFLEX) 500 MG capsule Take 1 capsule (500 mg total) by mouth 2 (two) times daily. 14 capsule Particia Nearing, [place]   fluconazole (DIFLUCAN) 150 MG tablet Take 1 tablet (150 mg total) by mouth once for 1 dose. 1 tablet Particia Nearing, [place]     PDMP not reviewed this encounter.   Roosvelt Maser Brand[place], [place] 12/15/20 [phone]

## 2020-12-11 NOTE — ED Triage Notes (Signed)
Patient states she has been having bilateral thigh and hip pain x 4 days. Pt also complains of lower back pain for the same length of time. Pt states she has also felt fatigued. Pt complains of a bump or mass in her vaginal area as well as a rash and vaginal discharge. Pt is aox4 and ambulatory.

## 2020-12-12 ENCOUNTER — Telehealth (HOSPITAL_COMMUNITY): Payer: Self-pay | Admitting: Emergency Medicine

## 2020-12-12 LAB — CERVICOVAGINAL ANCILLARY ONLY
Bacterial Vaginitis (gardnerella): POSITIVE — AB
Candida Glabrata: NEGATIVE
Candida Vaginitis: NEGATIVE
Chlamydia: NEGATIVE
Comment: NEGATIVE
Comment: NEGATIVE
Comment: NEGATIVE
Comment: NEGATIVE
Comment: NEGATIVE
Comment: NORMAL
Neisseria Gonorrhea: NEGATIVE
Trichomonas: POSITIVE — AB

## 2020-12-12 LAB — CBC WITH DIFFERENTIAL/PLATELET
Basophils Absolute: 0 10*3/uL (ref 0.0–0.2)
Basos: 1 %
EOS (ABSOLUTE): 0.2 10*3/uL (ref 0.0–0.4)
Eos: 4 %
Hematocrit: 38.4 % (ref 34.0–46.6)
Hemoglobin: 12.7 g/dL (ref 11.1–15.9)
Immature Grans (Abs): 0 10*3/uL (ref 0.0–0.1)
Immature Granulocytes: 0 %
Lymphocytes Absolute: 2 10*3/uL (ref 0.7–3.1)
Lymphs: 42 %
MCH: 29.2 pg (ref 26.6–33.0)
MCHC: 33.1 g/dL (ref 31.5–35.7)
MCV: 88 fL (ref 79–97)
Monocytes Absolute: 0.5 10*3/uL (ref 0.1–0.9)
Monocytes: 10 %
Neutrophils Absolute: 2 10*3/uL (ref 1.4–7.0)
Neutrophils: 43 %
Platelets: 198 10*3/uL (ref 150–450)
RBC: 4.35 x10E6/uL (ref 3.77–5.28)
RDW: 12.3 % (ref 11.7–15.4)
WBC: 4.6 10*3/uL (ref 3.4–10.8)

## 2020-12-12 LAB — COMPREHENSIVE METABOLIC PANEL
ALT: 16 IU/L (ref 0–32)
AST: 21 IU/L (ref 0–40)
Albumin/Globulin Ratio: 1.7 (ref 1.2–2.2)
Albumin: 4.7 g/dL (ref 3.9–5.0)
Alkaline Phosphatase: 66 IU/L (ref 44–121)
BUN/Creatinine Ratio: 12 (ref 9–23)
BUN: 10 mg/dL (ref 6–20)
Bilirubin Total: 0.2 mg/dL (ref 0.0–1.2)
CO2: 19 mmol/L — ABNORMAL LOW (ref 20–29)
Calcium: 9.3 mg/dL (ref 8.7–10.2)
Chloride: 101 mmol/L (ref 96–106)
Creatinine, Ser: 0.84 mg/dL (ref 0.57–1.00)
GFR calc Af Amer: 113 mL/min/{1.73_m2} (ref >59)
GFR calc non Af Amer: 98 mL/min/{1.73_m2} (ref >59)
Globulin, Total: 2.7 g/dL (ref 1.5–4.5)
Glucose: 92 mg/dL (ref 65–99)
Potassium: 4 mmol/L (ref 3.5–5.2)
Sodium: 138 mmol/L (ref 134–144)
Total Protein: 7.4 g/dL (ref 6.0–8.5)

## 2020-12-12 MED ORDER — METRONIDAZOLE 500 MG PO TABS: 500.0000 mg | ORAL_TABLET | Freq: Two times a day (BID) | ORAL | 0 refills | Status: DC

## 2021-02-11 ENCOUNTER — Ambulatory Visit
Admission: EM | Admit: 2021-02-11 | Discharge: 2021-02-11 | Disposition: A | Discharge status: Home / Self Care | Payer: 59 | Attending: Emergency Medicine | Admitting: Emergency Medicine

## 2021-02-11 ENCOUNTER — Other Ambulatory Visit: Payer: Self-pay

## 2021-02-11 DIAGNOSIS — Z1152 Encounter for screening for COVID-19: Secondary | ICD-10-CM

## 2021-02-11 DIAGNOSIS — J069 Acute upper respiratory infection, unspecified: Secondary | ICD-10-CM | POA: Diagnosis not present

## 2021-02-11 DIAGNOSIS — J4520 Mild intermittent asthma, uncomplicated: Secondary | ICD-10-CM | POA: Diagnosis not present

## 2021-02-11 MED ORDER — FLUTICASONE PROPIONATE 50 MCG/ACT NA SUSP: 1.0000 | Freq: Every day | NASAL | 0 refills | Status: DC

## 2021-02-11 MED ORDER — BENZONATATE 200 MG PO CAPS
200.0000 mg | ORAL_CAPSULE | Freq: Three times a day (TID) | ORAL | 0 refills | Status: AC | PRN
Start: 2021-02-11 — End: 2021-02-18

## 2021-02-11 MED ORDER — PREDNISONE 20 MG PO TABS: 40.0000 mg | ORAL_TABLET | Freq: Every day | ORAL | 0 refills | Status: AC

## 2021-02-11 MED ORDER — CETIRIZINE HCL 10 MG PO CAPS: 10.0000 mg | ORAL_CAPSULE | Freq: Every day | ORAL | 0 refills | Status: DC

## 2021-02-11 NOTE — ED Provider Notes (Signed)
[facility]    CSN: [id number] Arrival date & time: 02/11/21  1201      History   Chief Complaint Chief Complaint  Patient presents with  . Generalized Body Aches  . Nasal Congestion  . Shortness of Breath    HPI Jamie Stevens is a 25 y.o. female history of asthma presenting today for evaluation of URI symptoms.  Reports cough with associated shortness of breath.  Said associated fatigue and body aches.  Symptoms began yesterday.  Has had some discomfort in chest with coughing as well as some shortness of breath, but denies any wheezing.  Reports having albuterol inhaler at home to use as needed.  Declines needing refill.  Denies known fevers or Covid exposures.  HPI  Past Medical History:  Diagnosis Date  . Asthma   . Depression   . Obesity   . Panic anxiety syndrome   . Seasonal allergies   . Vision abnormalities    Pt wears glasses    Patient Active Problem List   Diagnosis Date Noted  . 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 MDD (major depressive disorder) 08/22/2015    Past Surgical History:  Procedure Laterality Date  . ADENOIDECTOMY    . TONSILLECTOMY      OB History   No obstetric history on file.      Home Medications    Prior to Admission medications   Medication Sig Start Date End Date Taking? Authorizing Provider  benzonatate (TESSALON) 200 MG capsule Take 1 capsule (200 mg total) by mouth 3 (three) times daily as needed for up to 7 days for cough. 02/11/21 02/18/21 Yes Anakin Varkey C, PA-C  Cetirizine HCl 10 MG CAPS Take 1 capsule (10 mg total) by mouth daily for 10 days. 02/11/21 02/21/21 Yes Denney Shein C, PA-C  fluticasone (FLONASE) 50 MCG/ACT nasal spray Place 1-2 sprays into both nostrils daily. 02/11/21  Yes Krystalynn Ridgeway C, PA-C  predniSONE (DELTASONE) 20 MG tablet Take 2 tablets (40 mg total) by mouth daily with breakfast for 5 days. 02/11/21 02/16/21 Yes Christos Mixson C, PA-C  albuterol (PROVENTIL  HFA;VENTOLIN HFA) 108 (90 Base) MCG/ACT inhaler Inhale 1-2 puffs into the lungs every 4 (four) hours as needed for wheezing or shortness of breath. 12/31/18   Cathie Hoops, Amy V, PA-C  budesonide-formoterol (SYMBICORT) 160-4.5 MCG/ACT inhaler Inhale 2 puffs into the lungs 2 (two) times daily.    [provider]  buPROPion (WELLBUTRIN XL) 300 MG 24 hr tablet Take 1 tablet (300 mg total) by mouth daily. 08/27/15   Thedora Hinders, MD  cephALEXin (KEFLEX) 500 MG capsule Take 1 capsule (500 mg total) by mouth 2 (two) times daily. 12/11/20   Particia Nearing, PA-C  chlorhexidine (HIBICLENS) 4 % external liquid Apply topically daily as needed. 12/11/20   Particia Nearing, PA-C  ipratropium (ATROVENT) 0.06 % nasal spray Place 2 sprays into both nostrils 4 (four) times daily. 12/31/18   Cathie Hoops, Amy V, PA-C  naproxen (NAPROSYN) 500 MG tablet Take 1 tablet (500 mg total) by mouth 2 (two) times daily as needed. 12/11/20   Particia Nearing, PA-C    Family History No family history on file.  Social History Social History   Tobacco Use  . Smoking status: Passive Smoke Exposure - Never Smoker  . Smokeless tobacco: Never Used  Vaping Use  . Vaping Use: Never used  Substance Use Topics  . Alcohol use: No  . Drug use: No  Allergies   Patient has no known allergies.   Review of Systems Review of Systems  Constitutional: Positive for chills and fatigue. Negative for activity change, appetite change and fever.  HENT: Positive for congestion and rhinorrhea. Negative for ear pain, sinus pressure, sore throat and trouble swallowing.   Eyes: Negative for discharge and redness.  Respiratory: Positive for cough. Negative for chest tightness and shortness of breath.   Cardiovascular: Negative for chest pain.  Gastrointestinal: Negative for abdominal pain, diarrhea, nausea and vomiting.  Musculoskeletal: Negative for myalgias.  Skin: Negative for rash.  Neurological: Positive for  headaches. Negative for dizziness and light-headedness.     Physical Exam Triage Vital Signs ED Triage Vitals [02/11/21 1217]  Enc Vitals Group     BP 136/87     Pulse Rate (!) 106     Resp 18     Temp 99 F (37.2 C)     Temp src      SpO2 95 %     Weight      Height      Head Circumference      Peak Flow      Pain Score 7     Pain Loc      Pain Edu?      Excl. in GC?    No data found.  Updated Vital Signs BP 136/87 (BP Location: Left Arm)   Pulse (!) 106   Temp 99 F (37.2 C)   Resp 18   SpO2 95%   Visual Acuity Right Eye Distance:   Left Eye Distance:   Bilateral Distance:    Right Eye Near:   Left Eye Near:    Bilateral Near:     Physical Exam Vitals and nursing note reviewed.  Constitutional:      Appearance: She is well-developed.     Comments: No acute distress  HENT:     Head: Normocephalic and atraumatic.     Ears:     Comments: Bilateral ears without tenderness to palpation of external auricle, tragus and mastoid, EAC's without erythema or swelling, TM's with good bony landmarks and cone of light. Non erythematous.     Nose: Nose normal.     Mouth/Throat:     Comments: Oral mucosa pink and moist, no tonsillar enlargement or exudate. Posterior pharynx patent and nonerythematous, no uvula deviation or swelling. Normal phonation. Eyes:     Conjunctiva/sclera: Conjunctivae normal.  Cardiovascular:     Rate and Rhythm: Normal rate.  Pulmonary:     Effort: Pulmonary effort is normal. No respiratory distress.     Comments: Breathing comfortably at rest, CTABL, no wheezing, rales or other adventitious sounds auscultated Abdominal:     General: There is no distension.  Musculoskeletal:        General: Normal range of motion.     Cervical back: Neck supple.  Skin:    General: Skin is warm and dry.  Neurological:     Mental Status: She is alert and oriented to person, place, and time.      UC Treatments / Results  Labs (all labs ordered are  listed, but only abnormal results are displayed) Labs Reviewed  NOVEL CORONAVIRUS, NAA    EKG   Radiology No results found.  Procedures Procedures (including critical care time)  Medications Ordered in UC Medications - No data to display  Initial Impression / Assessment and Plan / UC Course  I have reviewed the triage vital signs and the nursing notes.  Pertinent labs & imaging results that were available during my care of the patient were reviewed by me and considered in my medical decision making (see chart for details).    Viral URI with cough-Covid test pending, recommending symptomatic and supportive care of symptoms, suspect viral etiology, providing prednisone course to use if asthma worsening and not far relief with albuterol alone, rest and push fluids.  Symptoms x2 days.  We will continue to monitor over the next week.  Discussed strict return precautions. Patient verbalized understanding and is agreeable with plan.  Final Clinical Impressions(s) / UC Diagnoses   Final diagnoses:  Encounter for screening for COVID-19  Viral URI with cough  Mild intermittent asthma without complication     Discharge Instructions     Covid test pending, monitor MyChart for results Daily cetirizine and Flonase to help with sinus congestion and pressure Tessalon every 8 hours for cough, may use over-the-counter Mucinex DM to further relieve cough/congestion Use albuterol inhaler as needed for shortness of breath, chest tightness and wheezing Prednisone prescription at pharmacy if needed Rest and fluids Follow-up if not improving or worsening    ED Prescriptions    Medication Sig Dispense Auth. Provider   predniSONE (DELTASONE) 20 MG tablet Take 2 tablets (40 mg total) by mouth daily with breakfast for 5 days. 10 tablet Asal Teas C, PA-C   benzonatate (TESSALON) 200 MG capsule Take 1 capsule (200 mg total) by mouth 3 (three) times daily as needed for up to 7 days for  cough. 28 capsule Jesly Hartmann C, PA-C   fluticasone (FLONASE) 50 MCG/ACT nasal spray Place 1-2 sprays into both nostrils daily. 16 g Kelsea Mousel C, PA-C   Cetirizine HCl 10 MG CAPS Take 1 capsule (10 mg total) by mouth daily for 10 days. 10 capsule Tagen Brethauer, [place] C, PA-C     PDMP not reviewed this encounter.   Lew Dawes, PA-C 02/11/21 1248

## 2021-02-11 NOTE — Discharge Instructions (Signed)
Covid test pending, monitor MyChart for results Daily cetirizine and Flonase to help with sinus congestion and pressure Tessalon every 8 hours for cough, may use over-the-counter Mucinex DM to further relieve cough/congestion Use albuterol inhaler as needed for shortness of breath, chest tightness and wheezing Prednisone prescription at pharmacy if needed Rest and fluids Follow-up if not improving or worsening

## 2021-02-11 NOTE — ED Triage Notes (Signed)
Pt present cough with SOB, fatigue and body aches. Symptoms started yesterday. Pt would like to be tested for covid.

## 2021-02-12 LAB — NOVEL CORONAVIRUS, NAA: SARS-CoV-2, NAA: NOT DETECTED

## 2021-02-12 LAB — SARS-COV-2, NAA 2 DAY TAT

## 2021-02-20 DIAGNOSIS — F988 Other specified behavioral and emotional disorders with onset usually occurring in childhood and adolescence: Secondary | ICD-10-CM | POA: Insufficient documentation

## 2021-02-20 DIAGNOSIS — F5081 Binge eating disorder: Secondary | ICD-10-CM | POA: Insufficient documentation

## 2021-04-25 ENCOUNTER — Ambulatory Visit (INDEPENDENT_AMBULATORY_CARE_PROVIDER_SITE_OTHER): Payer: 59 | Admitting: *Deleted

## 2021-04-25 ENCOUNTER — Other Ambulatory Visit: Payer: Self-pay

## 2021-04-25 ENCOUNTER — Encounter (HOSPITAL_COMMUNITY): Payer: Self-pay | Admitting: Obstetrics & Gynecology

## 2021-04-25 ENCOUNTER — Inpatient Hospital Stay (HOSPITAL_COMMUNITY)
Admission: AD | Admit: 2021-04-25 | Discharge: 2021-04-25 | Discharge status: Against Medical Advice | Payer: 59 | Attending: Obstetrics & Gynecology | Admitting: Obstetrics & Gynecology

## 2021-04-25 ENCOUNTER — Inpatient Hospital Stay (HOSPITAL_COMMUNITY): Payer: 59

## 2021-04-25 VITALS — BP 131/79 | HR 81 | Temp 99.0°F | Wt 252.8 lb

## 2021-04-25 DIAGNOSIS — Z3400 Encounter for supervision of normal first pregnancy, unspecified trimester: Secondary | ICD-10-CM | POA: Insufficient documentation

## 2021-04-25 DIAGNOSIS — Z9151 Personal history of suicidal behavior: Secondary | ICD-10-CM

## 2021-04-25 DIAGNOSIS — O26891 Other specified pregnancy related conditions, first trimester: Secondary | ICD-10-CM | POA: Insufficient documentation

## 2021-04-25 DIAGNOSIS — R109 Unspecified abdominal pain: Secondary | ICD-10-CM | POA: Diagnosis not present

## 2021-04-25 DIAGNOSIS — O99341 Other mental disorders complicating pregnancy, first trimester: Secondary | ICD-10-CM | POA: Diagnosis not present

## 2021-04-25 DIAGNOSIS — R45851 Suicidal ideations: Secondary | ICD-10-CM | POA: Diagnosis not present

## 2021-04-25 DIAGNOSIS — Z79899 Other long term (current) drug therapy: Secondary | ICD-10-CM | POA: Diagnosis not present

## 2021-04-25 DIAGNOSIS — O021 Missed abortion: Secondary | ICD-10-CM | POA: Diagnosis not present

## 2021-04-25 DIAGNOSIS — F4321 Adjustment disorder with depressed mood: Secondary | ICD-10-CM

## 2021-04-25 DIAGNOSIS — F332 Major depressive disorder, recurrent severe without psychotic features: Secondary | ICD-10-CM | POA: Insufficient documentation

## 2021-04-25 DIAGNOSIS — Z3A10 10 weeks gestation of pregnancy: Secondary | ICD-10-CM | POA: Diagnosis not present

## 2021-04-25 LAB — URINALYSIS, ROUTINE W REFLEX MICROSCOPIC
Bilirubin Urine: NEGATIVE
Glucose, UA: NEGATIVE mg/dL
Hgb urine dipstick: NEGATIVE
Ketones, ur: NEGATIVE mg/dL
Nitrite: NEGATIVE
Protein, ur: NEGATIVE mg/dL
Specific Gravity, Urine: 1.029 (ref 1.005–1.030)
pH: 6 (ref 5.0–8.0)

## 2021-04-25 LAB — CBC
HCT: 31.8 % — ABNORMAL LOW (ref 36.0–46.0)
Hemoglobin: 10.3 g/dL — ABNORMAL LOW (ref 12.0–15.0)
MCH: 28.8 pg (ref 26.0–34.0)
MCHC: 32.4 g/dL (ref 30.0–36.0)
MCV: 88.8 fL (ref 80.0–100.0)
Platelets: 170 10*3/uL (ref 150–400)
RBC: 3.58 MIL/uL — ABNORMAL LOW (ref 3.87–5.11)
RDW: 14.1 % (ref 11.5–15.5)
WBC: 6.3 10*3/uL (ref 4.0–10.5)
nRBC: 0 % (ref 0.0–0.2)

## 2021-04-25 LAB — HCG, QUANTITATIVE, PREGNANCY: hCG, Beta Chain, Quant, S: 59112 m[IU]/mL — ABNORMAL HIGH (ref <5)

## 2021-04-25 MED ORDER — LACTATED RINGERS IV SOLN: INTRAVENOUS | Status: DC

## 2021-04-25 MED ORDER — FENTANYL CITRATE (PF) 100 MCG/2ML IJ SOLN
25.0000 ug | Freq: Once | INTRAMUSCULAR | Status: AC
  Administered 2021-04-25: 25 ug via INTRAVENOUS
  Filled 2021-04-25: qty 2

## 2021-04-25 MED ORDER — ACETAMINOPHEN 500 MG PO TABS
1000.0000 mg | ORAL_TABLET | Freq: Four times a day (QID) | ORAL | Status: DC | PRN
  Administered 2021-04-25: 1000 mg via ORAL
  Filled 2021-04-25: qty 2

## 2021-04-25 MED ORDER — METOCLOPRAMIDE HCL 10 MG PO TABS
10.0000 mg | ORAL_TABLET | Freq: Once | ORAL | Status: AC
  Administered 2021-04-25: 10 mg via ORAL
  Filled 2021-04-25: qty 1
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
## 2021-04-25 NOTE — Patient Instructions (Signed)
   Genetic Screening Results Information: You are having genetic testing called [facility] today.  It will take approximately 2 weeks before the results are available.  To get your results, you need Internet access to a web browser to search [place]/MyChart (the direct app on your phone will not give you these results).  Then select Lab Scanned and click on the blue hyper link that says View Image to see your [facility] results.  You can also use the directions on the purple card given to look up your results directly on the [facility] website.  

## 2021-04-25 NOTE — MAU Note (Signed)
Provider in to speak with pt after TCC consult. Pt tells provider that she does not want to stay here and she has a friend to stay with. Per provider pt will leave AMA

## 2021-04-25 NOTE — MAU Note (Signed)
Jamie Stevens is a 25 y.o. at [redacted]w[redacted]d here in MAU reporting: was in the office today and they were unable to doppler FHT. Pt reports she got scared and came over. Has been cramping for the past 1.5-2 weeks. No bleeding.  Onset of complaint: ongoing  Pain score: 5/10  Vitals:   04/25/21 1705  BP: 129/72  Pulse: 89  Resp: 16  Temp: 99.4 F (37.4 C)  SpO2: 98%     HUT:[id number]  Lab orders placed from triage: none

## 2021-04-25 NOTE — Discharge Instructions (Addendum)
FACTS YOU SHOULD KNOW  WHAT IS AN EARLY PREGNANCY FAILURE? Once the egg is fertilized with the sperm and begins to develop, it attaches to the lining of the uterus. This early pregnancy tissue may not develop into an embryo (the beginning stage of a baby). Sometimes an embryo does develop but does not continue to grow. These problems can be seen on ultrasound.   MANAGEMENT OF EARLY PREGNANCY FAILURE:  One in five pregnancies is found to be an early pregnancy failure.  There are 3 ways to care for an early pregnancy failure:   Surgery, (2) Medicine, (3) Waiting for you to pass the pregnancy on your own. The decision as to how to proceed after being diagnosed with and early pregnancy failure is an individual one.  The decision can be made only after appropriate counseling.  You need to weigh the pros and cons of the 3 choices. Then you can make the choice that works for you. SURGERY (D&E) Procedure over in 1 day Requires being put to sleep Bleeding may be light Possible problems during surgery, including injury to womb(uterus) Care provider has more control Medicine (CYTOTEC) The complete procedure may take days to weeks No Surgery Bleeding may be heavy at times There may be drug side effects Patient has more control Waiting You may choose to wait, in which case your own body may complete the passing of the abnormal early pregnancy on its own in about 2-4 weeks Your bleeding may be heavy at times There is a small possibility that you may need surgery if the bleeding is too much or not all of the pregnancy has passed. CYTOTEC MANAGEMENT Prostaglandins (cytotec) are the most widely used drug for this purpose. They cause the uterus to cramp and contract. You will place the medicine yourself inside your vagina in the privacy of your home. Empting of the uterus should occur within 3 days but the process may continue for several weeks. The bleeding may seem heavy at times. POSSIBLE SIDE EFFECTS  FROM CYTOTEC Nausea   Vomiting Diarrhea Fever Chills  Hot Flashes Side effects  from the process of the early pregnancy failure include: Cramping  Bleeding Headaches  Dizziness RISKS: This is a low risk procedure. Less than 1 in 100 women has a complication. An incomplete passage of the early pregnancy may occur. Also, Hemorrhage (heavy bleeding) could happen.  Rarely the pregnancy will not be passed completely. Excessively heavy bleeding may occur.  Your doctor may need to perform surgery to empty the uterus (D&E). Afterwards: Everybody will feel differently after the early pregnancy completion. You may have soreness or cramps for a day or two. You may have soreness or cramps for day or two.  You may have light bleeding for up to 2 weeks. You may be as active as you feel like being. If you have any of the following problems you may call Maternity Admissions Unit at [phone]. If you have pain that does not get better  with pain medication Bleeding that soaks through 2 thick full-sized sanitary pads in an hour Cramps that last longer than 2 days Foul smelling discharge Fever above 100.4 degrees F Even if you do not have any of these symptoms, you should have a follow-up exam to make sure you are healing properly. This appointment will be made for you before you leave the hospital. Your next normal period will start again in 4-6 week after the loss. You can get pregnant soon after the loss, so use birth  control right away. Finally: Make sure all your questions are answered before during and after any procedure. Follow up with medical care and family planning methods.

## 2021-04-25 NOTE — MAU Note (Signed)
[facility] notified that a patient is in need of a tele-psych visit at 1835. They informed us that there is at least 5 patients ahead of her and it would be awhile before she could be seen. They will notify the MAU nurse via secure chat when they are ready.

## 2021-04-25 NOTE — Progress Notes (Signed)
   Location: [facility] Patient: clinic Provider: clinic  PRENATAL INTAKE SUMMARY  Jamie Stevens presents today New OB Nurse Interview.  OB History     Gravida  1   Para      Term      Preterm      AB      Living         SAB      IAB      Ectopic      Multiple      Live Births             I have reviewed the patient's medical, obstetrical, social, and family histories, medications, and available lab results.  SUBJECTIVE She complains of white vaginal discharge. Denies odor, itching or burning.  OBJECTIVE Initial Nurse interview for history/labs (New OB)  EDD: 11/16/2021 by LMP  GA: [redacted]w[redacted]d G1P0 FHT: unable to auscultate fetal heart  GENERAL APPEARANCE: alert, well appearing, in no apparent distress, oriented to person, place and time   ASSESSMENT Normal pregnancy  PLAN Prenatal care:  will decide at next visit OB Pnl/HIV/Hep C OB Urine Culture GC/CT/PAP at next visit with Jamie Stevens, CNM 05/03/21 HgbEval/SMA/CF ([facility]) Panorama A1C Ultrasound <14 weeks to confirm viability/dating due to unable to auscultate fetal heart BP monitor given Has weight scale at home Follow Up Instructions:   I discussed the assessment and treatment plan with the patient. The patient was provided an opportunity to ask questions and all were answered. The patient agreed with the plan and demonstrated an understanding of the instructions.   The patient was advised to call back or seek an in-person evaluation if the symptoms worsen or if the condition fails to improve as anticipated.  I provided 40 minutes of  face-to-face time during this encounter.  Clovis Pu, RN

## 2021-04-25 NOTE — MAU Provider Note (Addendum)
History     CSN: [id number]  Arrival date and time: 04/25/21 1647   Event Date/Time   First Provider Initiated Contact with Patient 04/25/21 1740      Chief Complaint  Patient presents with   Abdominal Pain   HPI  Patient presents for evaluation of positive pregnancy test and abdominal cramping. Reports finding out she was pregannt but has not yet had an ultrasound. Reports ongoing cramping and she went to an office today and they were unable to doppler baby. Reports cramping, headaches, fatigue.  Additionally, patient reports death of both parents this past month, as well as that she is getting evicted from her apartment. She endorses thoughts of self harm and admit that she would probably hurt herself if the baby has any issues. She reports thoughts of suicide but no active plan. She reports her dog and her friend as good supports. Endorses a history of a suicide attempt.      OB History     Gravida  1   Para      Term      Preterm      AB      Living         SAB      IAB      Ectopic      Multiple      Live Births              Past Medical History:  Diagnosis Date   Asthma    Depression    GERD (gastroesophageal reflux disease)    Obesity    Panic anxiety syndrome    Seasonal allergies    Vision abnormalities    Pt wears glasses    Past Surgical History:  Procedure Laterality Date   ADENOIDECTOMY     TONSILLECTOMY      Family History  Problem Relation Age of Onset   Vision loss Mother    Hyperlipidemia Mother    Diabetes Mother    Cancer Mother    Heart disease Father    Cancer Father     Social History   Tobacco Use   Smoking status: Never    Passive exposure: Yes   Smokeless tobacco: Never  Vaping Use   Vaping Use: Never used  Substance Use Topics   Alcohol use: No   Drug use: No    Allergies: No Known Allergies  Medications Prior to Admission  Medication Sig Dispense Refill Last Dose   Prenatal MV & Min w/FA-DHA  (PRENATAL GUMMIES) 0.18-25 MG CHEW Chew 2 Doses by mouth daily.   04/25/2021   sertraline (ZOLOFT) 100 MG tablet Take 100 mg by mouth daily.   04/24/2021   albuterol (PROVENTIL HFA;VENTOLIN HFA) 108 (90 Base) MCG/ACT inhaler Inhale 1-2 puffs into the lungs every 4 (four) hours as needed for wheezing or shortness of breath. 3.7 g 0 More than a month   budesonide-formoterol (SYMBICORT) 160-4.5 MCG/ACT inhaler Inhale 2 puffs into the lungs 2 (two) times daily.   More than a month   buPROPion (WELLBUTRIN XL) 300 MG 24 hr tablet Take 1 tablet (300 mg total) by mouth daily. (Patient not taking: Reported on 04/25/2021) 30 tablet 0    cephALEXin (KEFLEX) 500 MG capsule Take 1 capsule (500 mg total) by mouth 2 (two) times daily. (Patient not taking: Reported on 04/25/2021) 14 capsule 0    Cetirizine HCl 10 MG CAPS Take 1 capsule (10 mg total) by mouth daily for 10 days. 10 capsule  0    chlorhexidine (HIBICLENS) 4 % external liquid Apply topically daily as needed. (Patient not taking: Reported on 04/25/2021) 120 mL 0    fluticasone (FLONASE) 50 MCG/ACT nasal spray Place 1-2 sprays into both nostrils daily. (Patient not taking: Reported on 04/25/2021) 16 g 0    ipratropium (ATROVENT) 0.06 % nasal spray Place 2 sprays into both nostrils 4 (four) times daily. (Patient not taking: Reported on 04/25/2021) 15 mL 12    naproxen (NAPROSYN) 500 MG tablet Take 1 tablet (500 mg total) by mouth 2 (two) times daily as needed. (Patient not taking: Reported on 04/25/2021) 30 tablet 0     Review of Systems  Constitutional:  Negative for activity change, appetite change, chills, fatigue and fever.  Gastrointestinal:  Positive for abdominal pain.  Genitourinary:  Negative for dysuria.  Physical Exam   Blood pressure (!) 112/59, pulse 83, temperature 99.4 F (37.4 C), temperature source Oral, resp. rate 16, last menstrual period 02/09/2021, SpO2 100 %.  Physical Exam Vitals and nursing note reviewed.  Constitutional:       Appearance: She is well-developed.  HENT:     Head: Normocephalic and atraumatic.  Pulmonary:     Effort: Pulmonary effort is normal.  Abdominal:     General: Bowel sounds are normal.     Comments: Mild suprapubic tenderness, no rebound or guarding  Skin:    General: Skin is warm and dry.  Neurological:     Mental Status: She is alert.  Psychiatric:        Mood and Affect: Mood is anxious and depressed.     Comments: Not responding to internal stimuli, endorses thoughts of self harm, denies act suicide plan but endorses suicidal ideation     MAU Course  Procedures  MDM Pt evaluated at bedside TTS ordered, Psych consult called  TVUS, CBC, B HCG   HCG 59,112, Hgb 10.3  CLINICAL DATA:  Abdominal pain. EXAM: OBSTETRIC <14 WK [place] AND TRANSVAGINAL OB US TECHNIQUE: Both transabdominal and transvaginal ultrasound examinations were performed for complete evaluation of the gestation as well as the maternal uterus, adnexal regions, and pelvic cul-de-sac. Transvaginal technique was performed to assess early pregnancy.   COMPARISON:  None.   FINDINGS: Intrauterine gestational sac: Single   Yolk sac:  Visualized.   Embryo:  Visualized.   Cardiac Activity: Not Visualized.   Heart Rate: N/A  bpm   CRL:  13.8 mm   7 w   4 d                  [place] EDC: December 08, 2021   Subchorionic hemorrhage:  None visualized.   Maternal uterus/adnexae: A 4.0 cm x 4.0 cm x 3.6 cm heterogeneous uterine fibroid is noted.   The right ovary is visualized and is normal in appearance.   A small corpus luteum cyst is seen within the left ovary.   No pelvic free fluid is seen.   IMPRESSION: Findings meet definitive criteria for failed pregnancy. This follows SRU consensus guidelines: Diagnostic Criteria for Nonviable Pregnancy Early in the First Trimester. Macy Mis J Med [id number].   Pt notified of results. Provided grief support, discussed options.   Patient awaiting psych consult.  Message sent to Nira Conn NP, psychiatric APP.   Care signed out to Wynelle Bourgeois at 2100.   Casper Harrison, MD [facility] Fellow, Faculty Practice     Assessment and Plan   Failed IUP  -options counseling performed. Patient undecided. Message  sent to [facility] for outpatient follow up in 1 week.    Suicidal Ideations -TTS consult pending  Gita Kudo 04/25/2021, 6:17 PM   TTS states patient is not a candidate for Involuntary Commitment   Per Nira Conn NP: Patient is SI without a plan. Although she is high risk based on reported losses (both parents passed away the past month) and pregnancy. However, she does not meet criteria for IVC based on her reports of SI without a plan. If patient is unwilling to stay, she can sign out AMA.   Patient requested IV fluids to help her headache.  We gave her a liter and she reported resolution of her headache.   Discussed recommendation of the [facility] team to stay and be evaluated in am She states she does not want to stay.  She wants to leave and sleep in her own bed States she will call her therapist in the morning Contracts for safety .  Agrees to return if her feelings worsen Wants to sign out AMA.   I messaged office to either get her in late this week or call her to check on SAB status and MH status.   Aviva Signs, CNM

## 2021-04-26 LAB — OBSTETRIC PANEL, INCLUDING HIV
Antibody Screen: NEGATIVE
Basophils Absolute: 0 10*3/uL (ref 0.0–0.2)
Basos: 0 %
EOS (ABSOLUTE): 0 10*3/uL (ref 0.0–0.4)
Eos: 1 %
HIV Screen 4th Generation wRfx: NONREACTIVE
Hematocrit: 32 % — ABNORMAL LOW (ref 34.0–46.6)
Hemoglobin: 10.6 g/dL — ABNORMAL LOW (ref 11.1–15.9)
Hepatitis B Surface Ag: NEGATIVE
Immature Grans (Abs): 0 10*3/uL (ref 0.0–0.1)
Immature Granulocytes: 0 %
Lymphocytes Absolute: 2.1 10*3/uL (ref 0.7–3.1)
Lymphs: 37 %
MCH: 29.4 pg (ref 26.6–33.0)
MCHC: 33.1 g/dL (ref 31.5–35.7)
MCV: 89 fL (ref 79–97)
Monocytes Absolute: 0.5 10*3/uL (ref 0.1–0.9)
Monocytes: 9 %
Neutrophils Absolute: 3 10*3/uL (ref 1.4–7.0)
Neutrophils: 53 %
Platelets: 178 10*3/uL (ref 150–450)
RBC: 3.6 x10E6/uL — ABNORMAL LOW (ref 3.77–5.28)
RDW: 13.6 % (ref 11.7–15.4)
RPR Ser Ql: NONREACTIVE
Rh Factor: POSITIVE
Rubella Antibodies, IGG: 3.33 index (ref >0.99)
WBC: 5.7 10*3/uL (ref 3.4–10.8)

## 2021-04-26 LAB — HEMOGLOBIN A1C
Est. average glucose Bld gHb Est-mCnc: 94 mg/dL
Hgb A1c MFr Bld: 4.9 % (ref 4.8–5.6)

## 2021-04-26 LAB — ABO/RH: ABO/RH(D): O POS

## 2021-04-26 LAB — HEPATITIS C ANTIBODY: Hep C Virus Ab: <0.1 s/co ratio (ref 0.0–0.9)

## 2021-04-27 LAB — URINE CULTURE, OB REFLEX

## 2021-04-27 LAB — CULTURE, OB URINE

## 2021-05-03 ENCOUNTER — Encounter: Payer: 59 | Admitting: Obstetrics and Gynecology

## 2021-05-03 ENCOUNTER — Ambulatory Visit: Payer: 59

## 2021-05-05 ENCOUNTER — Encounter: Payer: Self-pay | Admitting: Family

## 2021-05-05 ENCOUNTER — Ambulatory Visit (INDEPENDENT_AMBULATORY_CARE_PROVIDER_SITE_OTHER): Payer: 59 | Admitting: Family

## 2021-05-05 ENCOUNTER — Other Ambulatory Visit: Payer: Self-pay

## 2021-05-05 VITALS — BP 124/81 | HR 80 | Wt 252.7 lb

## 2021-05-05 DIAGNOSIS — R102 Pelvic and perineal pain: Secondary | ICD-10-CM

## 2021-05-05 DIAGNOSIS — O26891 Other specified pregnancy related conditions, first trimester: Secondary | ICD-10-CM | POA: Diagnosis not present

## 2021-05-05 NOTE — Progress Notes (Signed)
History:  Ms. Jamie Stevens is a 25 y.o. G1P0 who presents to clinic today for follow-up on diagnosed missed AB.  Past last seen  04/25/21 in the MAU for abdominal cramping, no bleeding and ultrasound showed a failed pregnancy at 7 weeks.  No report of bleeding since MAU visit.  Reports some sadness and pt denies suicidal ideation.  Here with friend for support.  Permission obtained to continue appointment with friend present.  Pt requests 2nd ultrasound to confirm findings.  The following portions of the patient's history were reviewed and updated as appropriate: allergies, current medications, family history, past medical history, social history, past surgical history and problem list.  Review of Systems:  Review of Systems  Constitutional: Negative.   HENT: Negative.    Eyes: Negative.   Respiratory: Negative.    Cardiovascular: Negative.   Genitourinary:  Positive for pelvic pain. Negative for difficulty urinating, flank pain, hematuria, vaginal bleeding and vaginal discharge.  Musculoskeletal: Negative.   Neurological: Negative.       Objective:  Physical Exam BP 124/81   Pulse 80   Wt 252 lb 11.2 oz (114.6 kg)   LMP 02/09/2021   Breastfeeding Unknown   BMI 40.79 kg/m  Physical Exam Constitutional:      General: She is not in acute distress.    Appearance: She is well-developed.  HENT:     Head: Normocephalic and atraumatic.  Neck:     Thyroid: No thyromegaly.  Musculoskeletal:     Cervical back: Normal range of motion and neck supple.  Skin:    General: Skin is warm and dry.  Neurological:     Mental Status: She is alert and oriented to person, place, and time.     Labs and Imaging No results found for this or any previous visit (from the past 24 hour(s)).  No results found.   Assessment & Plan:  Missed AB   Plan: - Follow-up ultrasound per patient request; explained may be responsible for cost due to confirmation of no FHR on initial ultrasound -Continue  counseling - Reviewed process for medication induced AB vs DC; pt verbalized desire for DC after [place] confirmation   Amedeo Gory, CNM 05/05/2021 12:09 PM

## 2021-05-05 NOTE — Progress Notes (Signed)
Patient is here due to "missed abortion". She stated that she wants to make sure that there is "no sign of life" and requested an ultrasound. Behavior health therapist has been offered and patient declined stating that "for now I want to stick to who I'm talking to now"   Aldin Drees, CMA

## 2021-05-10 ENCOUNTER — Other Ambulatory Visit: Payer: 59

## 2021-05-11 ENCOUNTER — Telehealth: Payer: Self-pay | Admitting: Family Medicine

## 2021-05-11 NOTE — Telephone Encounter (Signed)
Patient had a SAB and she is now bleeding, she also has a question about a ultra sound

## 2021-05-11 NOTE — Telephone Encounter (Signed)
Patient calling said she had a SAB and she is now bleeding and she's not sure what she need to do.

## 2021-05-11 NOTE — Telephone Encounter (Signed)
Called pt and pt informed me that she just started having some vaginal spotting that was light pink yesterday.  Pt also reports that she is having some cramping as well.  I asked pt if she has bleeding in the past or is the start of it since her miscarriage.  Pt reports that this is the start.  I explained to the patient that it is normal to have vaginal bleeding and cramping the the body is trying to rid the products of conception on its own.  I asked pt if she went to her U/S appt as noted in providers note on 05/05/21.  Pt reports that she cancelled the appt because she decided that because of the cost she did not need it.  Pt states that she is still in a blur about the miscarriage.  I encouraged pt to reschedule her U/S as she is not having much bleeding and to make sure that her body does what it needs to do.  Pt verbalized understanding.  Front office notified to contact pt to schedule another appt so that pt can get questions answered about her SAB.    Leonette Nutting  05/11/21

## 2021-05-12 ENCOUNTER — Other Ambulatory Visit: Payer: Self-pay

## 2021-05-12 ENCOUNTER — Inpatient Hospital Stay (HOSPITAL_COMMUNITY)
Admission: AD | Admit: 2021-05-12 | Discharge: 2021-05-12 | Disposition: A | Discharge status: Home / Self Care | Payer: 59 | Attending: Obstetrics and Gynecology | Admitting: Obstetrics and Gynecology

## 2021-05-12 ENCOUNTER — Inpatient Hospital Stay (HOSPITAL_COMMUNITY): Payer: 59

## 2021-05-12 ENCOUNTER — Encounter (HOSPITAL_COMMUNITY): Payer: Self-pay | Admitting: Obstetrics & Gynecology

## 2021-05-12 DIAGNOSIS — R102 Pelvic and perineal pain: Secondary | ICD-10-CM | POA: Diagnosis not present

## 2021-05-12 DIAGNOSIS — O26891 Other specified pregnancy related conditions, first trimester: Secondary | ICD-10-CM | POA: Diagnosis present

## 2021-05-12 DIAGNOSIS — O036 Delayed or excessive hemorrhage following complete or unspecified spontaneous abortion: Secondary | ICD-10-CM | POA: Diagnosis not present

## 2021-05-12 DIAGNOSIS — Z3A01 Less than 8 weeks gestation of pregnancy: Secondary | ICD-10-CM | POA: Diagnosis not present

## 2021-05-12 DIAGNOSIS — M545 Low back pain, unspecified: Secondary | ICD-10-CM | POA: Diagnosis not present

## 2021-05-12 DIAGNOSIS — O039 Complete or unspecified spontaneous abortion without complication: Secondary | ICD-10-CM | POA: Diagnosis not present

## 2021-05-12 DIAGNOSIS — O4690 Antepartum hemorrhage, unspecified, unspecified trimester: Secondary | ICD-10-CM

## 2021-05-12 LAB — CBC WITH DIFFERENTIAL/PLATELET
Abs Immature Granulocytes: 0.01 10*3/uL (ref 0.00–0.07)
Basophils Absolute: 0.1 10*3/uL (ref 0.0–0.1)
Basophils Relative: 1 %
Eosinophils Absolute: 0.1 10*3/uL (ref 0.0–0.5)
Eosinophils Relative: 2 %
HCT: 30.2 % — ABNORMAL LOW (ref 36.0–46.0)
Hemoglobin: 9.9 g/dL — ABNORMAL LOW (ref 12.0–15.0)
Immature Granulocytes: 0 %
Lymphocytes Relative: 42 %
Lymphs Abs: 2.5 10*3/uL (ref 0.7–4.0)
MCH: 29.5 pg (ref 26.0–34.0)
MCHC: 32.8 g/dL (ref 30.0–36.0)
MCV: 89.9 fL (ref 80.0–100.0)
Monocytes Absolute: 0.7 10*3/uL (ref 0.1–1.0)
Monocytes Relative: 12 %
Neutro Abs: 2.6 10*3/uL (ref 1.7–7.7)
Neutrophils Relative %: 43 %
Platelets: 164 10*3/uL (ref 150–400)
RBC: 3.36 MIL/uL — ABNORMAL LOW (ref 3.87–5.11)
RDW: 13.2 % (ref 11.5–15.5)
WBC: 6 10*3/uL (ref 4.0–10.5)
nRBC: 0 % (ref 0.0–0.2)

## 2021-05-12 LAB — HCG, QUANTITATIVE, PREGNANCY: hCG, Beta Chain, Quant, S: 5139 m[IU]/mL — ABNORMAL HIGH (ref <5)

## 2021-05-12 MED ORDER — MISOPROSTOL 200 MCG PO TABS
800.0000 ug | ORAL_TABLET | Freq: Once | ORAL | Status: AC
  Administered 2021-05-12: 800 ug via BUCCAL
  Filled 2021-05-12: qty 4

## 2021-05-12 MED ORDER — IBUPROFEN 600 MG PO TABS: 600.0000 mg | ORAL_TABLET | Freq: Four times a day (QID) | ORAL | 3 refills | Status: DC | PRN

## 2021-05-12 MED ORDER — KETOROLAC TROMETHAMINE 60 MG/2ML IM SOLN
60.0000 mg | Freq: Once | INTRAMUSCULAR | Status: AC
  Administered 2021-05-12: 60 mg via INTRAMUSCULAR
  Filled 2021-05-12: qty 2

## 2021-05-12 MED ORDER — HYDROCODONE-ACETAMINOPHEN 5-325 MG PO TABS
2.0000 | ORAL_TABLET | Freq: Once | ORAL | Status: AC
Start: 2021-05-12 — End: 2021-05-12
  Administered 2021-05-12: 2 via ORAL
  Filled 2021-05-12: qty 2

## 2021-05-12 NOTE — MAU Note (Addendum)
A couple wk ago was here, was dx with a failed pregnancy,  initially thought she was scheduled for a  D&C last wk, but it was just a f/u visit.  Now was between taking the pills and a D&C? Started bleeding Wed night, light and kind of watery.  This was first episode of bleeding since dx. Today became darker in color.  Has been cramping since Wed.  After lunch went to the bathroom, to change her pad; gushed out and passed a large clot.continued to bleed.  Bled through her clothes.

## 2021-05-12 NOTE — MAU Provider Note (Addendum)
History     CSN: [id number]  Arrival date and time: 05/12/21 1634   Event Date/Time   First Provider Initiated Contact with Patient 05/12/21 1715      Chief Complaint  Patient presents with   Vaginal Bleeding   Abdominal Pain   HPI  Ms.Jamie Stevens is a 25 y.o. female G2P0 @ 7 weeks with a known failed pregnancy found on [place] last week, here with bleeding and abdominal pain.  She opted to have a D &C and was waiting to have one more [place] to confirm demise prior to scheduling surgery.  She took midol at 1030 this morning, this only helped some. She currently rates her pain 8/10; the pain is located in her pelvis and radiates and shoots to her lower back.  The bleeding is similar to a period.    OB History     Gravida  2   Para      Term      Preterm      AB      Living         SAB      IAB      Ectopic      Multiple      Live Births              Past Medical History:  Diagnosis Date   Asthma    Depression    GERD (gastroesophageal reflux disease)    Obesity    Panic anxiety syndrome    Seasonal allergies    Vision abnormalities    Pt wears glasses    Past Surgical History:  Procedure Laterality Date   ADENOIDECTOMY     TONSILLECTOMY      Family History  Problem Relation Age of Onset   Vision loss Mother    Hyperlipidemia Mother    Diabetes Mother    Cancer Mother    Heart disease Father    Cancer Father     Social History   Tobacco Use   Smoking status: Never    Passive exposure: Yes   Smokeless tobacco: Never  Vaping Use   Vaping Use: Never used  Substance Use Topics   Alcohol use: No   Drug use: No    Allergies: No Known Allergies  Medications Prior to Admission  Medication Sig Dispense Refill Last Dose   albuterol (PROVENTIL HFA;VENTOLIN HFA) 108 (90 Base) MCG/ACT inhaler Inhale 1-2 puffs into the lungs every 4 (four) hours as needed for wheezing or shortness of breath. 3.7 g 0 05/11/2021   sertraline (ZOLOFT) 100 MG  tablet Take 100 mg by mouth daily.   05/11/2021   budesonide-formoterol (SYMBICORT) 160-4.5 MCG/ACT inhaler Inhale 2 puffs into the lungs 2 (two) times daily. (Patient not taking: Reported on 05/05/2021)      buPROPion (WELLBUTRIN XL) 300 MG 24 hr tablet Take 1 tablet (300 mg total) by mouth daily. (Patient not taking: No sig reported) 30 tablet 0    cephALEXin (KEFLEX) 500 MG capsule Take 1 capsule (500 mg total) by mouth 2 (two) times daily. (Patient not taking: No sig reported) 14 capsule 0    Cetirizine HCl 10 MG CAPS Take 1 capsule (10 mg total) by mouth daily for 10 days. 10 capsule 0    chlorhexidine (HIBICLENS) 4 % external liquid Apply topically daily as needed. (Patient not taking: No sig reported) 120 mL 0    fluticasone (FLONASE) 50 MCG/ACT nasal spray Place 1-2 sprays into both nostrils daily. (  Patient not taking: No sig reported) 16 g 0    ipratropium (ATROVENT) 0.06 % nasal spray Place 2 sprays into both nostrils 4 (four) times daily. (Patient not taking: No sig reported) 15 mL 12    naproxen (NAPROSYN) 500 MG tablet Take 1 tablet (500 mg total) by mouth 2 (two) times daily as needed. (Patient not taking: No sig reported) 30 tablet 0    Prenatal MV & Min w/FA-DHA (PRENATAL GUMMIES) 0.18-25 MG CHEW Chew 2 Doses by mouth daily. (Patient not taking: Reported on 05/05/2021)      Results for orders placed or performed during the hospital encounter of 05/12/21 (from the past 72 hour(s))  hCG, quantitative, pregnancy     Status: Abnormal   Collection Time: 05/12/21  5:11 PM  Result Value Ref Range   hCG, Beta Chain, Quant, S 5,139 (H) <5 mIU/mL    Comment:          GEST. AGE      CONC.  (mIU/mL)   <=1 WEEK        5 - 50     2 WEEKS       50 - 500     3 WEEKS       100 - 10,000     4 WEEKS     1,000 - 30,000     5 WEEKS     3,500 - 115,000   6-8 WEEKS     12,000 - 270,000    12 WEEKS     15,000 - 220,000        FEMALE AND NON-PREGNANT FEMALE:     LESS THAN 5 mIU/mL Performed at [facility], [address] [place], [place] [address]   CBC with Differential/Platelet     Status: Abnormal   Collection Time: 05/12/21  5:11 PM  Result Value Ref Range   WBC 6.0 4.0 - 10.5 K/uL   RBC 3.36 (L) 3.87 - 5.11 MIL/uL   Hemoglobin 9.9 (L) 12.0 - 15.0 g/dL   HCT 29.5 (L) 18.8 - 41.6 %   MCV 89.9 80.0 - 100.0 fL   MCH 29.5 26.0 - 34.0 pg   MCHC 32.8 30.0 - 36.0 g/dL   RDW 60.6 30.1 - 60.1 %   Platelets 164 150 - 400 K/uL   nRBC 0.0 0.0 - 0.2 %   Neutrophils Relative % 43 %   Neutro Abs 2.6 1.7 - 7.7 K/uL   Lymphocytes Relative 42 %   Lymphs Abs 2.5 0.7 - 4.0 K/uL   Monocytes Relative 12 %   Monocytes Absolute 0.7 0.1 - 1.0 K/uL   Eosinophils Relative 2 %   Eosinophils Absolute 0.1 0.0 - 0.5 K/uL   Basophils Relative 1 %   Basophils Absolute 0.1 0.0 - 0.1 K/uL   Immature Granulocytes 0 %   Abs Immature Granulocytes 0.01 0.00 - 0.07 K/uL    Comment: Performed at [facility], [address], [place], [place] [address]    US OB LESS THAN 14 WEEKS WITH [place] TRANSVAGINAL  Result Date: 05/12/2021 CLINICAL DATA:  Pregnant, vaginal bleeding.  LMP 02/11/2021 EXAM: OBSTETRIC <14 WK [place] AND TRANSVAGINAL OB US TECHNIQUE: Both transabdominal and transvaginal ultrasound examinations were performed for complete evaluation of the gestation as well as the maternal uterus, adnexal regions, and pelvic cul-de-sac. Transvaginal technique was performed to assess early pregnancy. COMPARISON:  None. FINDINGS: Intrauterine gestational sac: Not visualized Yolk sac:  Not visualized Embryo:  Not visualized  Cardiac Activity: Not applicable Subchorionic hemorrhage:  None visualized. Maternal uterus/adnexae: The uterus is anteverted. There is a notable area of increased myometrial vascularity within the anterior fundus. There is thickening of the endometrium which appears heterogeneous in nature and also demonstrates markedly increased vascularity within the fundus. Distally, the thickened endometrium  appears relatively hypovascular. As noted above, no discrete intrauterine gestational sac is identified. The thickened tissue within the endometrium extends into the cervix and the endocervical canal appears expanded. The cervix is not fully evaluated on this examination. A a 4 cm subserosal fibroid is incidentally noted within the right uterine fundus. The maternal ovaries are normal in size and echogenicity. No adnexal masses are seen. There is no free fluid within the pelvis. IMPRESSION: Thickening of the endometrium demonstrating markedly increased vascularity and adjacent increased myometrial vascularity. Thickened endometrial tissue extends into the endocervical canal which appears expanded. No intrauterine gestational sac is identified. Together, the findings may reflect changes of an abortion in progress. Follow-up sonography is recommended in addition to serial examination of the serum beta HCG levels to document completion. 4 cm subserosal uterine fibroid. Electronically Signed   By: Helyn Numbers MD   On: 05/12/2021 18:40     Review of Systems  Constitutional:  Negative for fever.  Gastrointestinal:  Positive for abdominal pain. Negative for nausea and vomiting.  Genitourinary:  Positive for vaginal bleeding.  Physical Exam   Blood pressure (!) 141/81, pulse (!) 104, temperature 98.7 F (37.1 C), temperature source Oral, resp. rate 20, height 5\' 7"  (1.702 m), weight 114 kg, SpO2 99 %, unknown if currently breastfeeding.  Physical Exam Constitutional:      General: She is not in acute distress.    Appearance: She is well-developed. She is not ill-appearing or toxic-appearing.  HENT:     Head: Normocephalic.  Eyes:     Pupils: Pupils are equal, round, and reactive to light.  Abdominal:     Tenderness: There is generalized abdominal tenderness.  Genitourinary:    Comments: Vagina - Small amount of dark red blood pooling in the vagina.  Cervix - Small dime size clot noted at os, scant  active bleeding, cervix appears open.  Bimanual exam: deferred  Exam by , NP  Chaperone present for exam.   Neurological:     Mental Status: She is alert and oriented to person, place, and time.    MAU Course  Procedures None  MDM  O positive blood type.  Endometrial thickness 2.48 cm on Venia Carbon  Reviewed [place] results in detail with the patient, offered Cyototec for possible retained POC, patient is agreeable. Toradol 60 mg given IM, pain unchanged Vicodin 2 tablets given PO in MAU.   Report given to [place] CNM who resumes care of the patient.    Assessment and Plan   Reassessment (9:53 PM)  -Patient reports bleeding has decreased significantly. -She reports pain is manageable. -Educated on what to expect once discharged including bleeding and passing of remaining POC.  -Bleeding Precautions Given. -Rx for Ibuprofen 600mg  sent to pharmacy on file.  -Informed that she will follow up in 2 weeks at [facility].  Message sent for scheduling. -Encouraged to call or return to MAU if symptoms worsen or with the onset of new symptoms. -Discharged to home in stable condition.  MSN, CNM Advanced Practice Provider, Center for [facility]

## 2021-05-15 ENCOUNTER — Telehealth: Payer: Self-pay

## 2021-05-15 NOTE — BH Specialist Note (Addendum)
Integrated Behavioral Health via Telemedicine Visit  05/15/2021 [name] [id number]  Number of Integrated Behavioral Health visits: 1 Session Start time: 8:19  Session End time: 8:50 Total time:  31  Referring Provider: Wynelle Bourgeois, CNM Patient/Family location: Home [facility] Provider location: [facility]  All persons participating in visit: Patient Jamie Stevens and [facility] Phinneas Shakoor   Types of Service: Telephone visit  I connected with Jamie Stevens and/or Janey Genta Guiney's  n/a  via  Telephone or [employer]  (Video is Surveyor, mining) and verified that I am speaking with the correct person using two identifiers. Discussed confidentiality: Yes   I discussed the limitations of telemedicine and the availability of in person appointments.  Discussed there is a possibility of technology failure and discussed alternative modes of communication if that failure occurs.  I discussed that engaging in this telemedicine visit, they consent to the provision of behavioral healthcare and the services will be billed under their insurance.  Patient and/or legal guardian expressed understanding and consented to Telemedicine visit: Yes   Presenting Concerns: Patient and/or family reports the following symptoms/concerns: Pt states her primary concern today is feeling she is coping with her pregnancy loss, in the midst of navigating life challenges by herself (loss of parents; mother in 2019); negatively affecting school, mood, and sleep. Pt is coping with counselor; work is "somewhat therapeutic" working at a daycare.  Duration of problem: Less than three weeks; Severity of problem:  moderately severe  Patient and/or Family's Strengths/Protective Factors: Sense of purpose  Goals Addressed: Patient will:  Reduce symptoms of: anxiety, depression, and stress   Increase knowledge and/or ability of: healthy  habits and stress reduction   Demonstrate ability to: Increase healthy adjustment to current life circumstances and Increase adequate support systems for patient/family  Progress towards Goals: Ongoing  Interventions: Interventions utilized:  Sleep Hygiene, Psychoeducation and/or Health Education, Link to [employer], and Supportive Reflection Standardized Assessments completed:  PHQ9/GAD7 given in past two weeks  Patient and/or Family Response: Pt agrees with treatment plan  Assessment: Patient currently experiencing Grief and Psychosocial stress.   Patient may benefit from psychoeducation and brief therapeutic interventions regarding coping with symptoms of depression and anxiety related to loss, grief, life stress .  Plan: Follow up with behavioral health clinician on : Call Saquan Furtick at [phone] as needed Behavioral recommendations:  -Continue allowing time and space to grieve loss; prioritize healthy sleeping and eating for self-care at this time, as discussed -Consider registering for and attending pregnancy loss support group of choice at www.postpartum.net  -Continue seeing counselor  -Continue taking Zoloft as prescribed -Consider additional resources (on After Visit Summary) -Accept referral to [facility] -Accept referral to [facility] Referral(s): Integrated Art gallery manager (In Clinic) and [employer] Resources:  Food and grief support  I discussed the assessment and treatment plan with the patient and/or parent/guardian. They were provided an opportunity to ask questions and all were answered. They agreed with the plan and demonstrated an understanding of the instructions.   They were advised to call back or seek an in-person evaluation if the symptoms worsen or if the condition fails to improve as anticipated.  Valetta Close Yumna Ebers, LCSW  Depression screen [facility] 2/9 05/05/2021  Decreased Interest 1  Down, Depressed, Hopeless 1  PHQ - 2 Score 2  Altered  sleeping 3  Tired, decreased energy 2  Change in appetite 2  Feeling bad or failure  about yourself  1  Trouble concentrating 1  Moving slowly or fidgety/restless 2  Suicidal thoughts 0  PHQ-9 Score 13   GAD 7 : Generalized Anxiety Score 05/05/2021  Nervous, Anxious, on Edge 2  Control/stop worrying 2  Worry too much - different things 2  Trouble relaxing 3  Restless 2  Easily annoyed or irritable 3  Afraid - awful might happen 2  Total GAD 7 Score 16

## 2021-05-15 NOTE — Telephone Encounter (Signed)
Attempted to reach patient about her appointment. Sent a letter, and a MyChart message as well as a message on her phone.

## 2021-05-16 ENCOUNTER — Ambulatory Visit (INDEPENDENT_AMBULATORY_CARE_PROVIDER_SITE_OTHER): Payer: 59 | Admitting: Clinical

## 2021-05-16 DIAGNOSIS — Z7189 Other specified counseling: Secondary | ICD-10-CM

## 2021-05-16 DIAGNOSIS — F4321 Adjustment disorder with depressed mood: Secondary | ICD-10-CM

## 2021-05-16 DIAGNOSIS — Z658 Other specified problems related to psychosocial circumstances: Secondary | ICD-10-CM | POA: Diagnosis not present

## 2021-05-16 NOTE — Patient Instructions (Signed)
Center for [facility] at [facility] [address] [place], [place] [address] [phone] (main office) [phone] (Kelie Gainey's office)   Postpartum [employer] International (scroll down for online pregnancy loss support groups) MuscleTreatments.it  Stages of Grief  Denial (including shock and numbness) Anger (including emotional outbursts and fear) Bargaining (including searching for answers, pleading for a different outcome, disorganization, panic) Depression (including guilt, loneliness, isolating, difficulty re-entering "normal" life) Acceptance (including hope, affirmations, finding new strength, new patterns, building new relationships, and reaching out to help others)  These stages do not necessarily come in this order, and there is no time frame for grief. It takes as long as it takes. For some, it may seem easier to avoid feeling emotional pain, but in order to heal, it helps to allow time and space to feel the emotions, as they are, and to allow the grieving process to take place. The first year after a significant loss may be the most difficult. Reach out to friends, family, and/or join a support group of others who are also grieving. Find ways to maintain connection to lost loved ones.   /Emotional Wellbeing Apps and Websites Here are a few free apps meant to help you to help yourself.  To find, try searching on the internet to see if the app is offered on Apple/Android devices. If your first choice doesn't come up on your device, the good news is that there are many choices! Play around with different apps to see which ones are helpful to you.    Calm This is an app meant to help increase calm feelings. Includes info, strategies, and tools for tracking your feelings.      Calm Harm  This app is meant to help with self-harm. Provides many 5-minute or 15-min coping strategies for doing instead of hurting  yourself.       Healthy Minds Health Minds is a problem-solving tool to help deal with emotions and cope with stress you encounter wherever you are.      MindShift This app can help people cope with anxiety. Rather than trying to avoid anxiety, you can make an important shift and face it.      MY3  MY3 features a support system, safety plan and resources with the goal of offering a tool to use in a time of need.       My Life My Voice  This mood journal offers a simple solution for tracking your thoughts, feelings and moods. Animated emoticons can help identify your mood.       Relax Melodies Designed to help with sleep, on this app you can mix sounds and meditations for relaxation.      Smiling Mind Smiling Mind is meditation made easy: it's a simple tool that helps put a smile on your mind.        Stop, Breathe & Think  A friendly, simple guide for people through meditations for mindfulness and compassion.  Stop, Breathe and Think Kids Enter your current feelings and choose a "mission" to help you cope. Offers videos for certain moods instead of just sound recordings.       Team Orange The goal of this tool is to help teens change how they think, act, and react. This app helps you focus on your own good feelings and experiences.      The [employer] Box The [employer] Station[employer] Box (VHB) contains simple tools to help patients with coping, relaxation, distraction, and positive thinking.

## 2021-05-26 ENCOUNTER — Other Ambulatory Visit: Payer: Self-pay

## 2021-05-26 ENCOUNTER — Ambulatory Visit (INDEPENDENT_AMBULATORY_CARE_PROVIDER_SITE_OTHER): Payer: 59

## 2021-05-26 VITALS — BP 116/78 | HR 66 | Temp 98.3°F | Ht 67.0 in | Wt 259.8 lb

## 2021-05-26 DIAGNOSIS — O039 Complete or unspecified spontaneous abortion without complication: Secondary | ICD-10-CM

## 2021-05-26 DIAGNOSIS — Z09 Encounter for follow-up examination after completed treatment for conditions other than malignant neoplasm: Secondary | ICD-10-CM

## 2021-05-26 DIAGNOSIS — Z3009 Encounter for other general counseling and advice on contraception: Secondary | ICD-10-CM

## 2021-05-26 NOTE — Progress Notes (Signed)
GYNECOLOGY PROBLEM OFFICE VISIT NOTE  History:  Jamie Stevens is a 25 y.o. G2P0 here today for follow up appt s/t SAB. She reports she is having some difficulty coping with her loss.  She has spoken with LCSW and is aware of that resource.  She endorses feelings of depression, but denies SI/HI behaviors.    Patient reports intermittent "light cramping that is off and on and sporadic."  She states she does not utilize any medications for her cramping.  She reports her bleeding has "pretty much stopped, but is still fairly light and is pink in color." She denies abnormal odor or irritation.   She denies sexual activity.  No pain or difficulty with urination and denies constipation or diarrhea.   Patient expresses desire for pregnancy "later." And clarifies that she would prefer it when she is done with school.  She is currently in school for sociology and hopes to be an AnR agent for a record company or work in Software engineer.   She expresses some interest in birth control, but is unsure of which type.   Past Medical History:  Diagnosis Date   Asthma    Depression    GERD (gastroesophageal reflux disease)    Obesity    Panic anxiety syndrome    Seasonal allergies    Vision abnormalities    Pt wears glasses    Past Surgical History:  Procedure Laterality Date   ADENOIDECTOMY     TONSILLECTOMY      The following portions of the patient's history were reviewed and updated as appropriate: allergies, current medications, past family history, past medical history, past social history, past surgical history and problem list.   Health Maintenance:  No pap on file.  No mammogram on d/t age.   Review of Systems:  Genito-Urinary ROS: no dysuria, trouble voiding, or hematuria Gastrointestinal ROS: no abdominal pain, change in bowel habits, or black or bloody stools Objective:  Vitals:  Vitals:   05/26/21 1125  BP: 116/78  Pulse: 66  Temp: 98.3 F (36.8 C)  TempSrc: Oral   Weight: 259 lb 12.8 oz (117.8 kg)  Height: 5\' 7"  (1.702 m)    Physical Exam: Physical Exam Constitutional:      General: She is not in acute distress.    Appearance: Normal appearance.  HENT:     Head: Normocephalic and atraumatic.  Eyes:     Conjunctiva/sclera: Conjunctivae normal.  Cardiovascular:     Rate and Rhythm: Normal rate.  Pulmonary:     Effort: Pulmonary effort is normal.  Musculoskeletal:        General: Normal range of motion.     Cervical back: Normal range of motion.  Neurological:     Mental Status: She is alert and oriented to person, place, and time.  Skin:    General: Skin is warm and dry.  Psychiatric:        Mood and Affect: Mood normal.        Behavior: Behavior normal.        Thought Content: Thought content normal.  Vitals reviewed.     Labs and Imaging: OB LESS THAN 14 WEEKS WITH OB TRANSVAGINAL  Result Date: 05/12/2021 CLINICAL DATA:  Pregnant, vaginal bleeding.  LMP 02/11/2021 EXAM: OBSTETRIC <14 WK 02/13/2021 AND TRANSVAGINAL OB US TECHNIQUE: Both transabdominal and transvaginal ultrasound examinations were performed for complete evaluation of the gestation as well as the maternal uterus, adnexal regions, and pelvic cul-de-sac. Transvaginal technique was performed to  assess early pregnancy. COMPARISON:  None. FINDINGS: Intrauterine gestational sac: Not visualized Yolk sac:  Not visualized Embryo:  Not visualized Cardiac Activity: Not applicable Subchorionic hemorrhage:  None visualized. Maternal uterus/adnexae: The uterus is anteverted. There is a notable area of increased myometrial vascularity within the anterior fundus. There is thickening of the endometrium which appears heterogeneous in nature and also demonstrates markedly increased vascularity within the fundus. Distally, the thickened endometrium appears relatively hypovascular. As noted above, no discrete intrauterine gestational sac is identified. The thickened tissue within the endometrium  extends into the cervix and the endocervical canal appears expanded. The cervix is not fully evaluated on this examination. A a 4 cm subserosal fibroid is incidentally noted within the right uterine fundus. The maternal ovaries are normal in size and echogenicity. No adnexal masses are seen. There is no free fluid within the pelvis. IMPRESSION: Thickening of the endometrium demonstrating markedly increased vascularity and adjacent increased myometrial vascularity. Thickened endometrial tissue extends into the endocervical canal which appears expanded. No intrauterine gestational sac is identified. Together, the findings may reflect changes of an abortion in progress. Follow-up sonography is recommended in addition to serial examination of the serum beta HCG levels to document completion. 4 cm subserosal uterine fibroid. Electronically Signed   By: Helyn Numbers MD   On: 05/12/2021 18:40    Assessment & Plan:  25 year old S/P SAB  Follow Up Care Contraception Counseling   -hCG ordered and collected. -Discussed need for level to return to non-pregnant state.  Reviewed plan to follow until that time with weekly lab visits as necessary. -Extensive discussion regarding birth control methods.  Reviewed by tierred approach including risks and potential side effects. -Patient expresses some interest in ring, IUD, pills, and Depo. -Given pamphlets for [employer]. -Also given website, bedsider.org, and encouraged to research different birth control methods. -Will await hCG and plan for follow up as appropriate.    Total face-to-face time with patient: 15 minutes   Gerrit Heck, [place] 05/26/2021 11:37 AM

## 2021-05-27 LAB — BETA HCG QUANT (REF LAB): hCG Quant: 57 m[IU]/mL

## 2021-06-05 ENCOUNTER — Ambulatory Visit: Payer: 59 | Admitting: Obstetrics and Gynecology

## 2021-06-05 ENCOUNTER — Other Ambulatory Visit: Payer: 59

## 2021-06-15 DIAGNOSIS — F4001 Agoraphobia with panic disorder: Secondary | ICD-10-CM | POA: Insufficient documentation

## 2021-07-27 ENCOUNTER — Emergency Department (HOSPITAL_BASED_OUTPATIENT_CLINIC_OR_DEPARTMENT_OTHER)
Admission: EM | Admit: 2021-07-27 | Discharge: 2021-07-27 | Disposition: A | Discharge status: Home / Self Care | Payer: 59 | Attending: Emergency Medicine | Admitting: Emergency Medicine

## 2021-07-27 ENCOUNTER — Other Ambulatory Visit: Payer: Self-pay

## 2021-07-27 ENCOUNTER — Encounter (HOSPITAL_BASED_OUTPATIENT_CLINIC_OR_DEPARTMENT_OTHER): Payer: Self-pay | Admitting: Emergency Medicine

## 2021-07-27 DIAGNOSIS — J45909 Unspecified asthma, uncomplicated: Secondary | ICD-10-CM | POA: Diagnosis not present

## 2021-07-27 DIAGNOSIS — R509 Fever, unspecified: Secondary | ICD-10-CM | POA: Diagnosis not present

## 2021-07-27 DIAGNOSIS — F17200 Nicotine dependence, unspecified, uncomplicated: Secondary | ICD-10-CM | POA: Insufficient documentation

## 2021-07-27 DIAGNOSIS — R059 Cough, unspecified: Secondary | ICD-10-CM | POA: Insufficient documentation

## 2021-07-27 DIAGNOSIS — R519 Headache, unspecified: Secondary | ICD-10-CM | POA: Insufficient documentation

## 2021-07-27 DIAGNOSIS — Z20822 Contact with and (suspected) exposure to covid-19: Secondary | ICD-10-CM | POA: Insufficient documentation

## 2021-07-27 DIAGNOSIS — M791 Myalgia, unspecified site: Secondary | ICD-10-CM | POA: Diagnosis not present

## 2021-07-27 DIAGNOSIS — R0981 Nasal congestion: Secondary | ICD-10-CM | POA: Diagnosis not present

## 2021-07-27 DIAGNOSIS — J069 Acute upper respiratory infection, unspecified: Secondary | ICD-10-CM

## 2021-07-27 MED ORDER — AEROCHAMBER PLUS FLO-VU LARGE MISC
1.0000 | Freq: Once | Status: DC
  Filled 2021-07-27: qty 1

## 2021-07-27 MED ORDER — ALBUTEROL SULFATE HFA 108 (90 BASE) MCG/ACT IN AERS
2.0000 | INHALATION_SPRAY | Freq: Once | RESPIRATORY_TRACT | Status: AC
  Administered 2021-07-27: 2 via RESPIRATORY_TRACT
  Filled 2021-07-27: qty 6.7

## 2021-07-27 NOTE — Discharge Instructions (Addendum)
Please take Ibuprofen (Advil, motrin) and Tylenol (acetaminophen) to relieve your pain.    You may take up to 600 MG (3 pills) of normal strength ibuprofen every 8 hours as needed.   You make take tylenol, up to 1,000 mg (two extra strength pills) every 8 hours as needed.   It is safe to take ibuprofen and tylenol at the same time as they work differently.   Do not take more than 3,000 mg tylenol in a 24 hour period (not more than one dose every 8 hours.  Please check all medication labels as many medications such as pain and cold medications may contain tylenol.  Do not drink alcohol while taking these medications.  Do not take other NSAID'S while taking ibuprofen (such as aleve or naproxen).  Please take ibuprofen with food to decrease stomach upset.  You can use your inhaler as needed.  You can do up to 4 puffs every 4 hours, but start with 1-2 puffs.  If you are needing to use 4 puffs or more for more than 8 hours or have severe shortness of breath, or your symptoms are not being improved then please seek additional medical care and evaluation.   If you develop shortness of breath that is not improved with the inhaler, swelling in 1 or both legs, chest pain, or have any new or concerning symptoms please seek additional medical care and evaluation.  Please follow your covid results in my chart.

## 2021-07-27 NOTE — ED Provider Notes (Signed)
[facility] EMERGENCY DEPARTMENT Provider Note   CSN: [id number] Arrival date & time: 07/27/21  1937     History Chief Complaint  Patient presents with   Cough   Nasal Congestion    Jamie Stevens is a 25 y.o. female with past medical history of asthma, GERD, seasonal allergies, who presents today for evaluation of about 1 week of congestion, productive cough, head pressure, body aches, and subjective fevers.  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  She states that she is not had any COVID testing yet.  No significant aggravating or alleviating factors noted.  She does state that when she gets up and moves around a lot she feels slightly short of breath.  She does not have a albuterol inhaler at home currently.  She denies constant shortness of breath.  No chest pain or leg swelling.  No rashes.  No hemoptysis.  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   MDD (major depressive disorder) 08/22/2015    Past Surgical History:  Procedure Laterality Date   ADENOIDECTOMY     TONSILLECTOMY       OB History     Gravida  1   Para      Term      Preterm      AB  1   Living         SAB  1   IAB      Ectopic      Multiple      Live Births              Family History  Problem Relation Age of Onset   Vision loss Mother    Hyperlipidemia Mother    Diabetes Mother    Cancer Mother    Heart disease Father    Cancer Father     Social History   Tobacco Use   Smoking status: Some Days    Passive exposure:  Yes   Smokeless tobacco: Never  Vaping Use   Vaping Use: Never used  Substance Use Topics   Alcohol use: No   Drug use: Yes    Types: Marijuana    Home Medications Prior to Admission medications   Medication Sig Start Date End Date Taking? Authorizing Provider  albuterol (PROVENTIL HFA;VENTOLIN HFA) 108 (90 Base) MCG/ACT inhaler Inhale 1-2 puffs into the lungs every 4 (four) hours as needed for wheezing or shortness of breath. 12/31/18   Cathie Hoops, Amy V, PA-C  Cetirizine HCl 10 MG CAPS Take 1 capsule (10 mg total) by mouth daily for 10 days. 02/11/21 05/12/21  Wieters, Hallie C, PA-C  ibuprofen (ADVIL) 600 MG tablet Take 1 tablet (600 mg total) by mouth every 6 (six) hours as needed. Patient not taking: Reported on 05/26/2021 05/12/21   Gerrit Heck, CNM  sertraline (ZOLOFT) 100 MG tablet Take 100 mg by mouth daily.    [provider]    Allergies    Patient  has no known allergies.  Review of Systems   Review of Systems  Constitutional:  Positive for fatigue and fever (Subjective). Negative for chills.  HENT:  Positive for congestion, postnasal drip, sinus pressure, sinus pain and sore throat. Negative for ear discharge and ear pain.   Eyes:  Negative for visual disturbance.  Respiratory:  Positive for cough and wheezing.   Cardiovascular:  Negative for chest pain, palpitations and leg swelling.  Gastrointestinal:  Negative for abdominal pain, constipation, diarrhea, nausea and vomiting.  Genitourinary:  Negative for dyspareunia.  Musculoskeletal:  Positive for arthralgias and myalgias.  Skin:  Negative for color change and rash.  Neurological:  Positive for headaches. Negative for seizures and speech difficulty.  Psychiatric/Behavioral:  Negative for confusion.   All other systems reviewed and are negative.  Physical Exam Updated Vital Signs BP (!) 144/106 (BP Location: Left Arm)   Pulse 90   Temp 99.2 F (37.3 C) (Oral)   Resp 16   Ht 5\' 7"  (1.702 m)   Wt 115.2 kg   LMP  07/20/2021 Comment: dx with failed pregnancy  SpO2 98%   BMI 39.78 kg/m   Physical Exam Vitals and nursing note reviewed.  Constitutional:      General: She is not in acute distress.    Appearance: She is well-developed. She is not diaphoretic.  HENT:     Head: Normocephalic and atraumatic.     Right Ear: Tympanic membrane, ear canal and external ear normal.     Left Ear: Tympanic membrane, ear canal and external ear normal.     Nose: Mucosal edema, congestion and rhinorrhea present.     Mouth/Throat:     Mouth: Mucous membranes are moist.     Pharynx: Oropharynx is clear. Uvula midline. No oropharyngeal exudate or posterior oropharyngeal erythema.     Tonsils: No tonsillar exudate.  Eyes:     General: No scleral icterus.    Conjunctiva/sclera: Conjunctivae normal.  Cardiovascular:     Rate and Rhythm: Normal rate and regular rhythm.  Pulmonary:     Effort: Pulmonary effort is normal. No respiratory distress.     Breath sounds: Normal breath sounds. No wheezing.     Comments: Occasional cough Musculoskeletal:     Cervical back: Normal range of motion and neck supple.  Lymphadenopathy:     Cervical: No cervical adenopathy.  Skin:    General: Skin is warm and dry.  Neurological:     Mental Status: She is alert.     Comments: Patient is awake and alert.  Answers questions appropriately without difficulty.  Psychiatric:        Mood and Affect: Mood normal.        Behavior: Behavior normal.    ED Results / Procedures / Treatments   Labs (all labs ordered are listed, but only abnormal results are displayed) Labs Reviewed - No data to display  EKG None  Radiology No results found.  Procedures Procedures   Medications Ordered in ED Medications - No data to display  ED Course  I have reviewed the triage vital signs and the nursing notes.  Pertinent labs & imaging results that were available during my care of the patient were reviewed by me and considered in my  medical decision making (see chart for details).    MDM Rules/Calculators/A&P                          07/22/2021 was evaluated  in Emergency Department on 07/28/2021 for the symptoms described in the history of present illness. She was evaluated in the context of the global COVID-19 pandemic, which necessitated consideration that the patient might be at risk for infection with the SARS-CoV-2 virus that causes COVID-19. Institutional protocols and algorithms that pertain to the evaluation of patients at risk for COVID-19 are in a state of rapid change based on information released by regulatory bodies including the CDC and federal and state organizations. These policies and algorithms were followed during the patient's care in the ED.  Patient is a healthy 25 year old who presents today for evaluation of 4 to 5 days of viral symptoms including body aches, subjective fevers, productive cough, headache, myalgias/arthralgias and fatigue.  She has not yet taken a COVID test.  COVID persistent.  She has had multiple RSV exposures due to her work at a daycare.  We discussed options for testing for this and patient declined testing. She has her lung sounds to auscultation bilaterally.  Is not hypoxic.  She has a reassuring physical exam.  Given that she has a albuterol inhaler in the department to help with her feelings of shortness of breath that she occasionally gets.  She is instructed on proper use. Given that she is not hypoxic wheezing she does not appear to require steroid.    Work note is given.  Return precautions were discussed with patient who states their understanding.  At the time of discharge patient denied any unaddressed complaints or concerns.  Patient is agreeable for discharge home.  Note: Portions of this report may have been transcribed using voice recognition software. Every effort was made to ensure accuracy; however, inadvertent computerized transcription errors may be  present   Final Clinical Impression(s) / ED Diagnoses Final diagnoses:  None    Rx / DC Orders ED Discharge Orders     None        Cristina Gong, PA-C 07/28/21 0045    Rolan Bucco, MD 07/28/21 [phone]

## 2021-07-27 NOTE — ED Triage Notes (Signed)
Patient arrived via POV c/o head and chest congestion x 1 week. Patient states cough with green/yellow phlegm, head pressure w/ hx of asthma. 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 Patient is AO x 4, VS WDL, normal gait.

## 2021-07-28 LAB — SARS CORONAVIRUS 2 (TAT 6-24 HRS): SARS Coronavirus 2: NEGATIVE

## 2021-08-27 IMAGING — US US OB < 14 WEEKS - US OB TV
1 series · 15 of 28 positions shown · non-contrast
Comparison: None.

CLINICAL DATA: Abdominal pain.

EXAM:
OBSTETRIC <14 WK US AND TRANSVAGINAL OB US
TECHNIQUE: Both transabdominal and transvaginal ultrasound examinations were
performed for complete evaluation of the gestation as well as the
maternal uterus, adnexal regions, and pelvic cul-de-sac.
Transvaginal technique was performed to assess early pregnancy.

[Series 1: us ob < 14 weeks - us ob tv · 63 acquisitions, 15 frames shown]
[im 1/63]
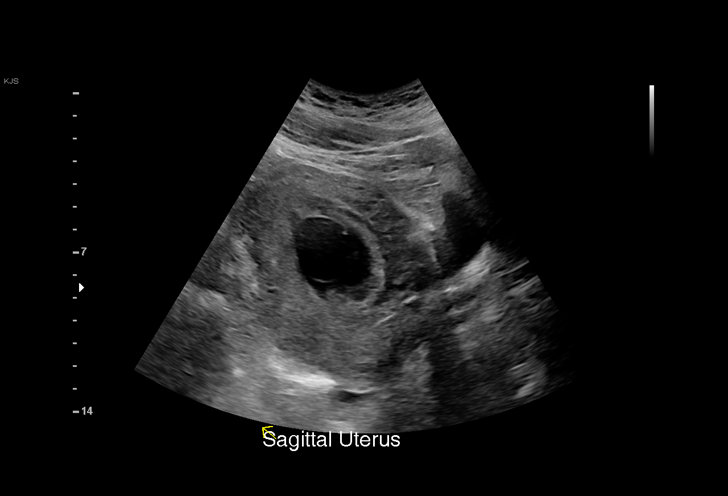
[im 5/63]
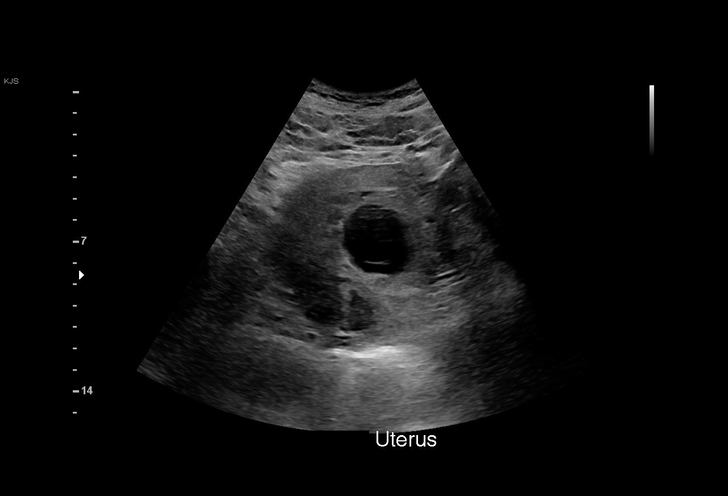
[im 10/63]
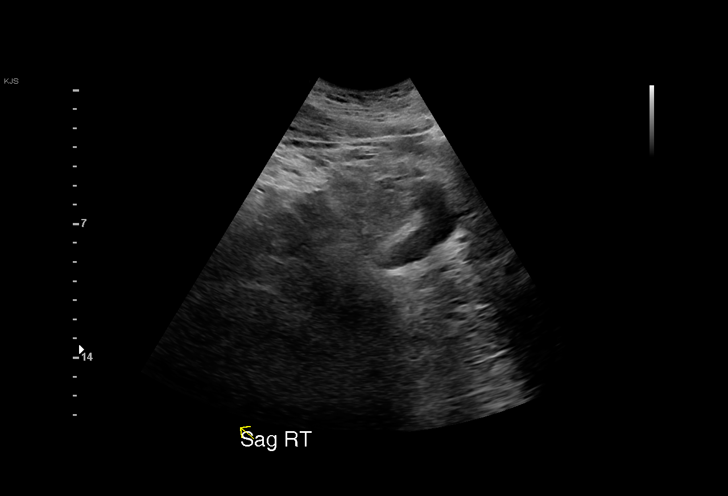
[im 14/63]
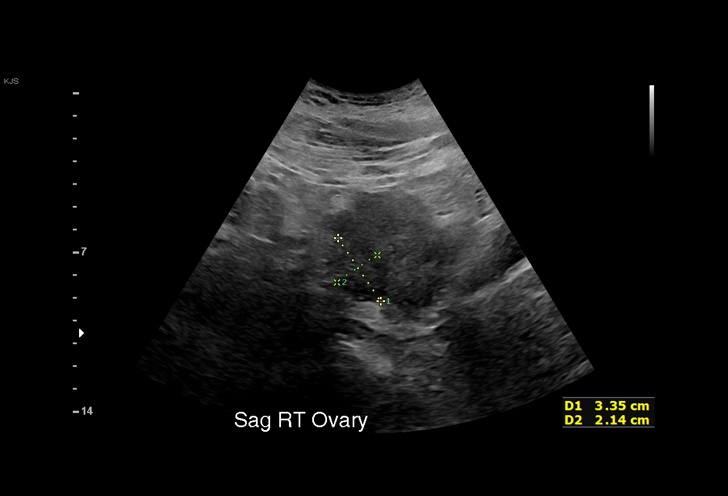
[im 19/63]
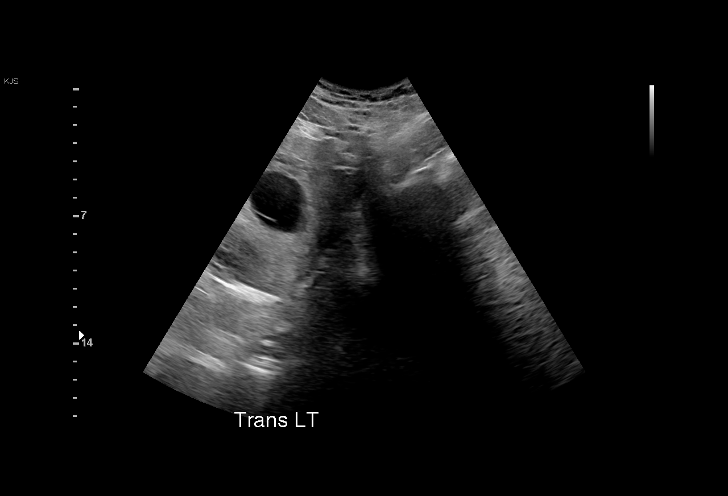
[im 23/63]
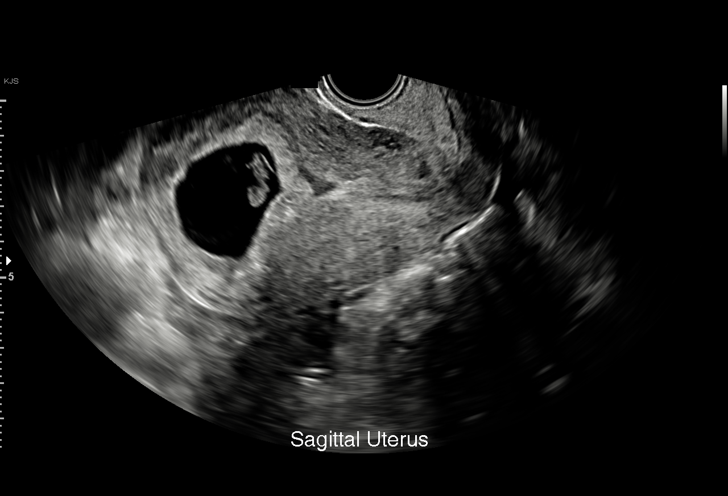
[im 28/63]
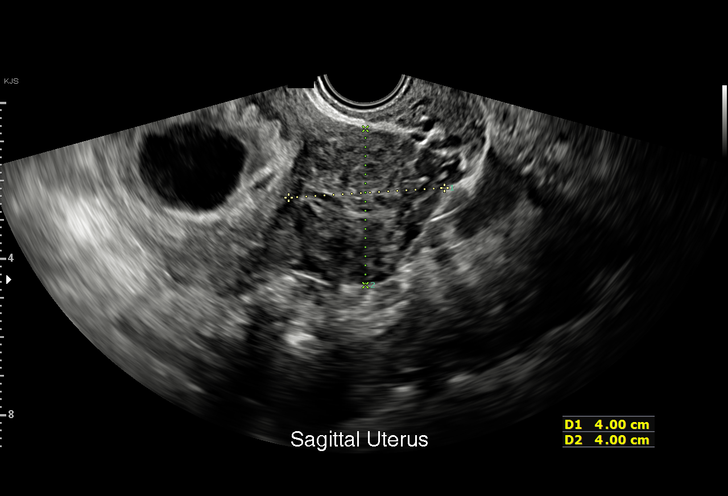
[im 33/63]
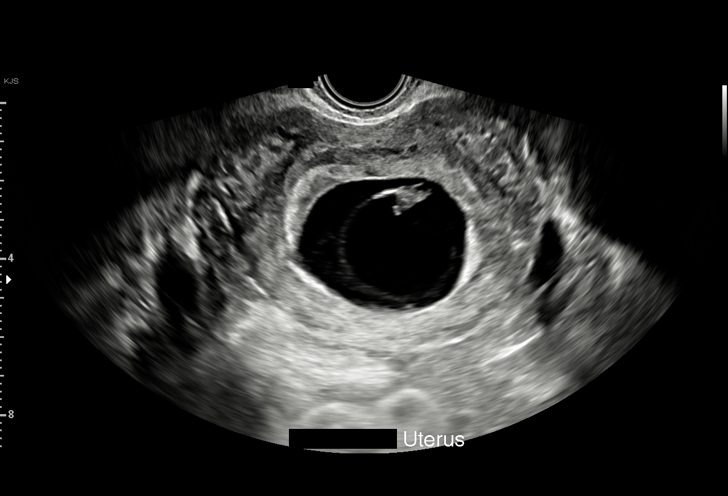
[im 35/63]
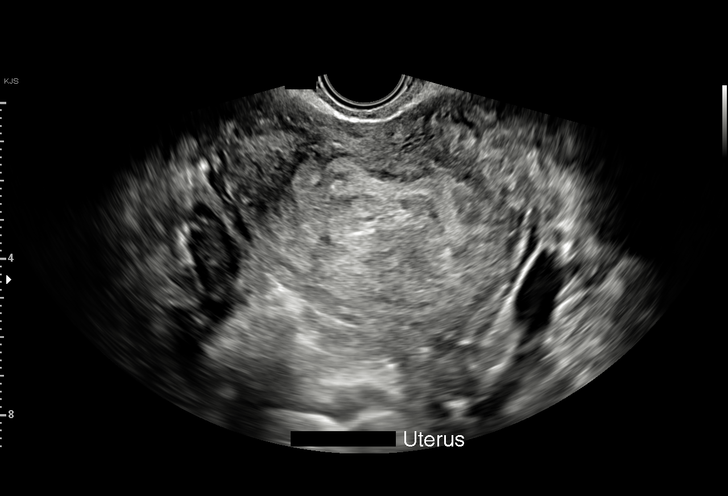
[im 40/63]
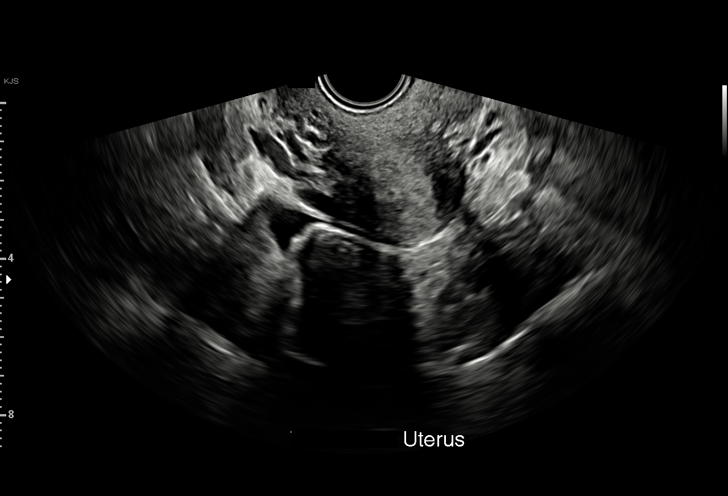
[im 44/63]
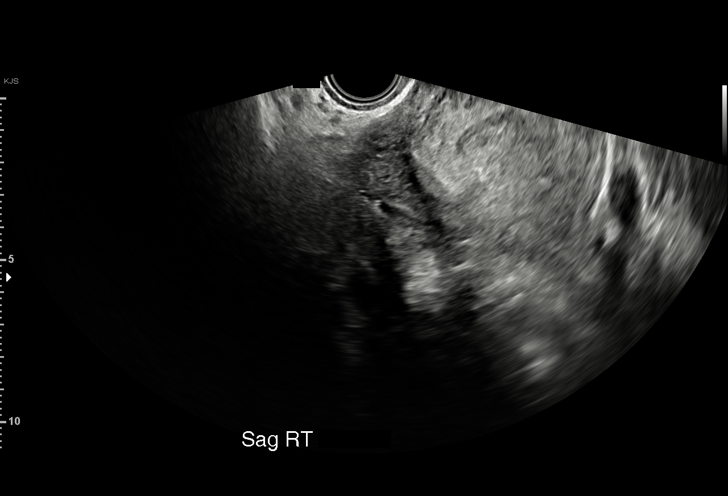
[im 49/63]
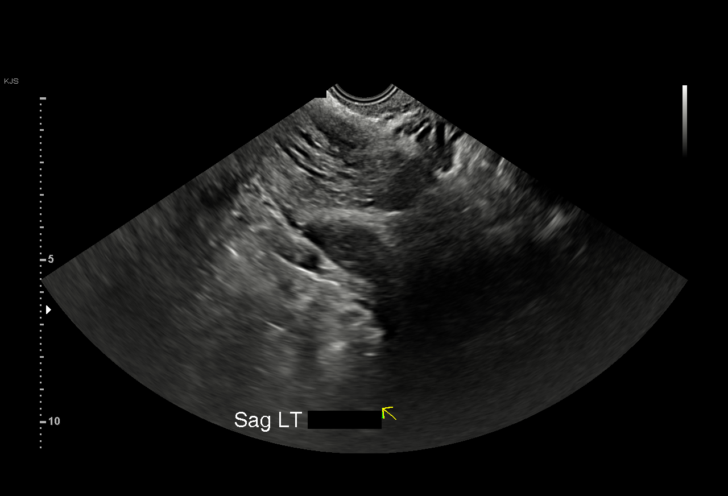
[im 53/63]
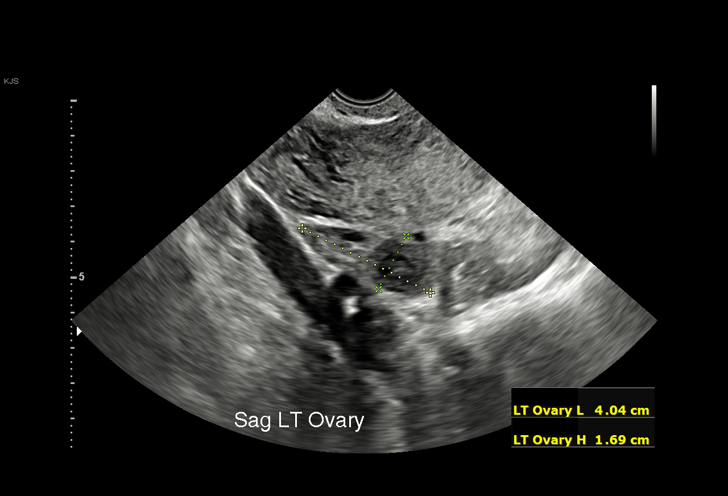
[im 58/63]
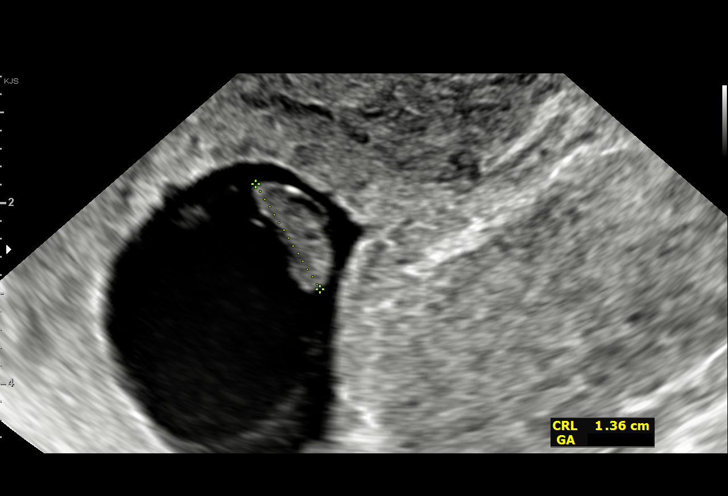
[im 63/63]
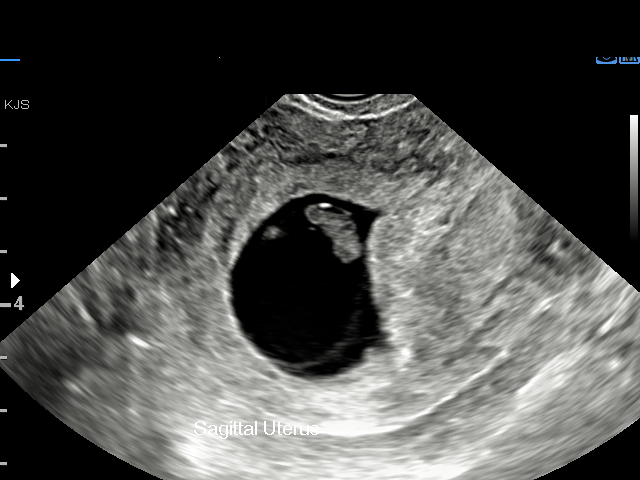

[15 of 28 positions shown; findings below may reference images not displayed]

FINDINGS: Intrauterine gestational sac: Single

Yolk sac:  Visualized.

Embryo:  Visualized.

Cardiac Activity: Not Visualized.

Heart Rate: N/A  bpm

CRL:  13.8 mm   7 w   4 d                  US EDC: December 08, 2021

Subchorionic hemorrhage:  None visualized.

Maternal uterus/adnexae: A 4.0 cm x 4.0 cm x 3.6 cm heterogeneous
uterine fibroid is noted.

The right ovary is visualized and is normal in appearance.

A small corpus luteum cyst is seen within the left ovary.

No pelvic free fluid is seen.
IMPRESSION: Findings meet definitive criteria for failed pregnancy. This follows
SRU consensus guidelines: Diagnostic Criteria for Nonviable
Pregnancy Early in the First Trimester. N Engl J Med

## 2021-09-13 IMAGING — US US OB < 14 WEEKS - US OB TV
1 series · 15 of 28 positions shown · non-contrast
Comparison: None.

CLINICAL DATA: Pregnant, vaginal bleeding.  LMP 02/11/2021

EXAM:
OBSTETRIC <14 WK US AND TRANSVAGINAL OB US
TECHNIQUE: Both transabdominal and transvaginal ultrasound examinations were
performed for complete evaluation of the gestation as well as the
maternal uterus, adnexal regions, and pelvic cul-de-sac.
Transvaginal technique was performed to assess early pregnancy.

[Series 1: us ob < 14 weeks - us ob tv · 58 acquisitions, 15 frames shown]
[im 1/58]
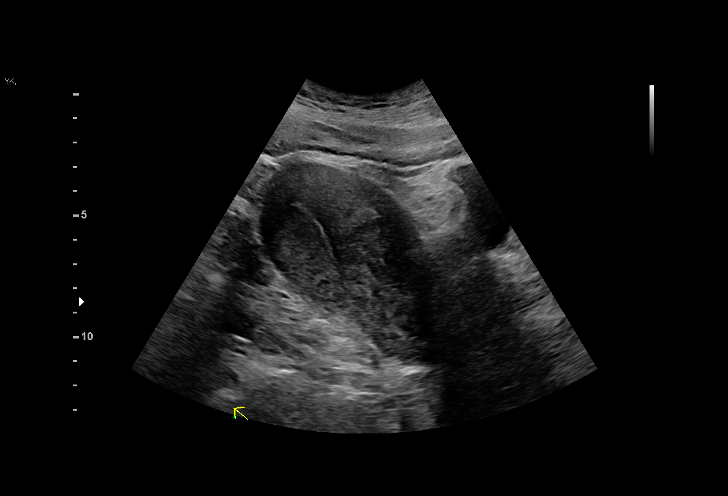
[im 5/58]
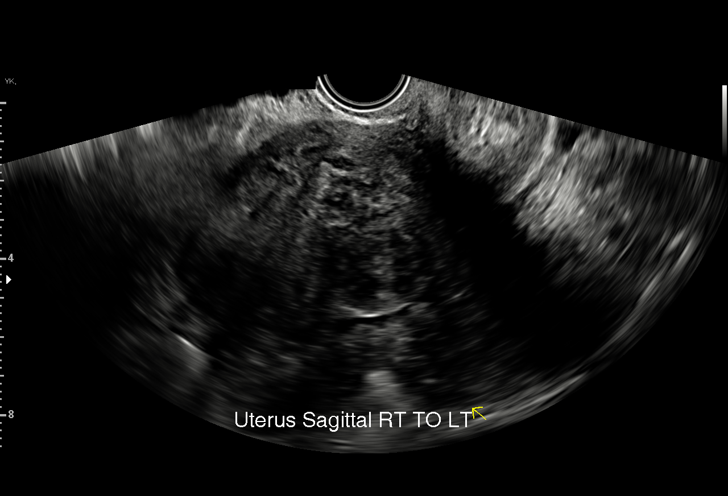
[im 9/58]
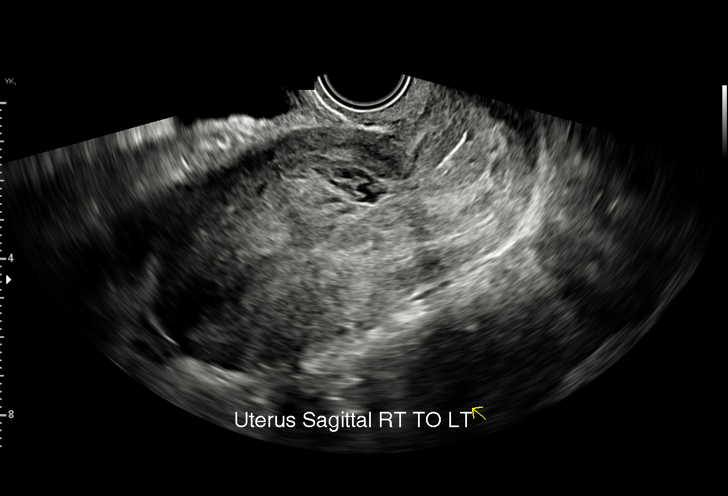
[im 13/58]
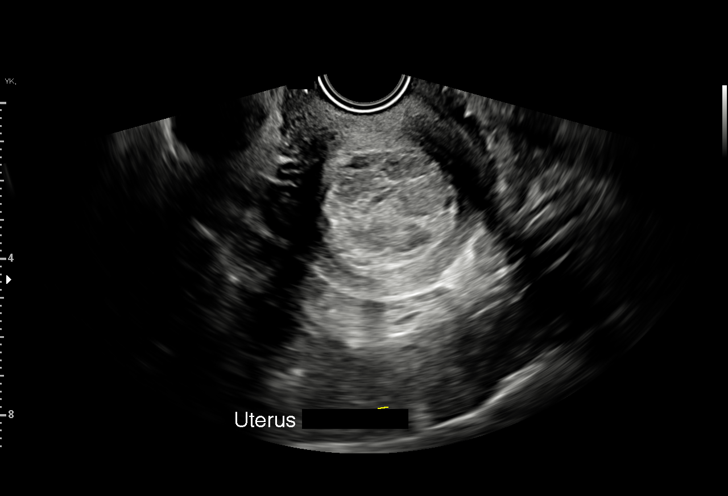
[im 17/58]
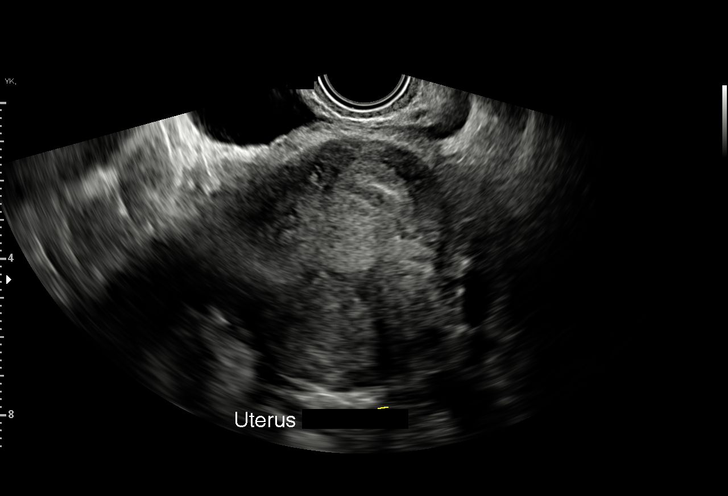
[im 22/58]
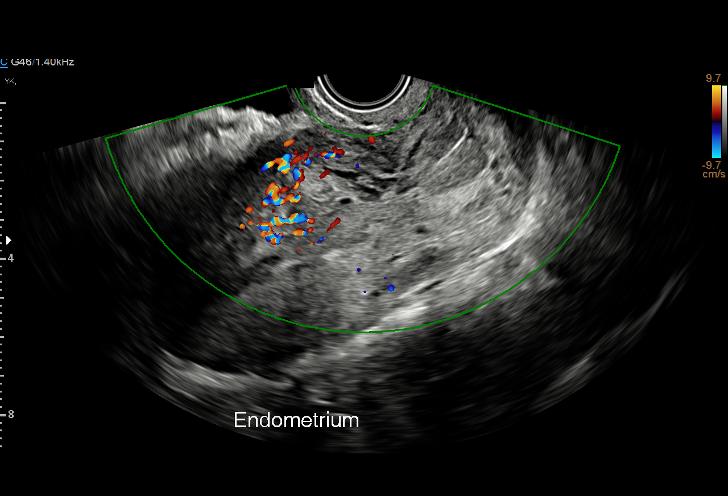
[im 26/58]
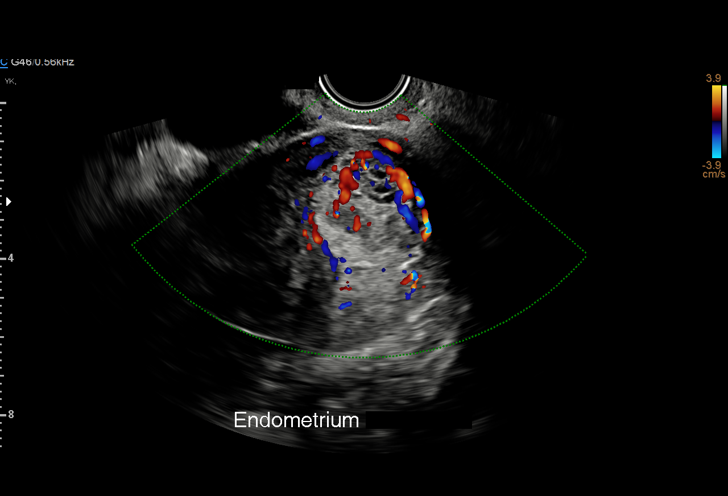
[im 30/58]
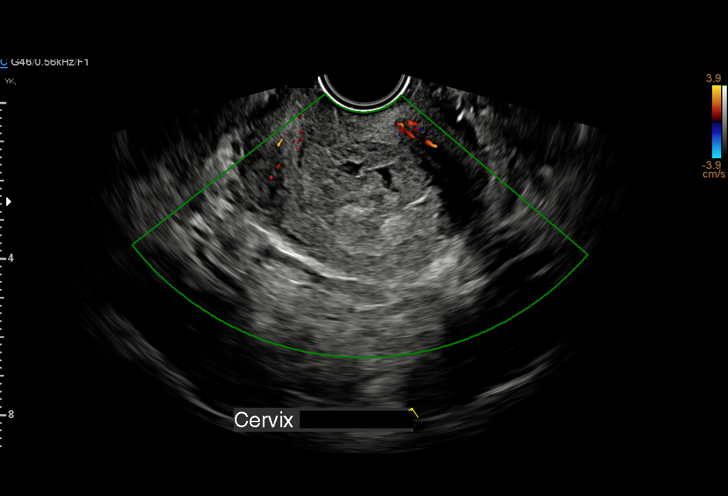
[im 32/58]
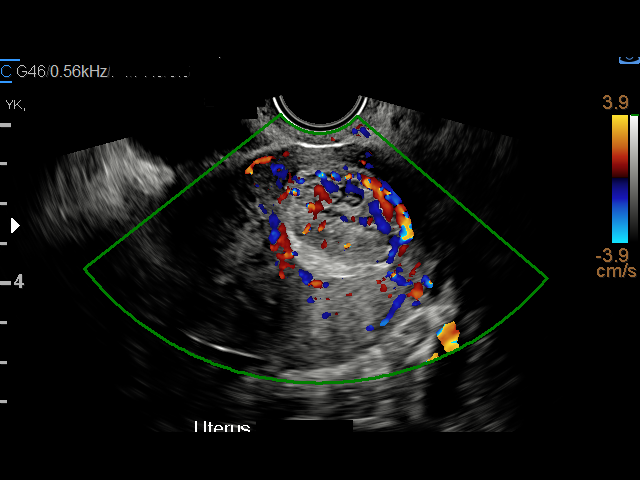
[im 36/58]
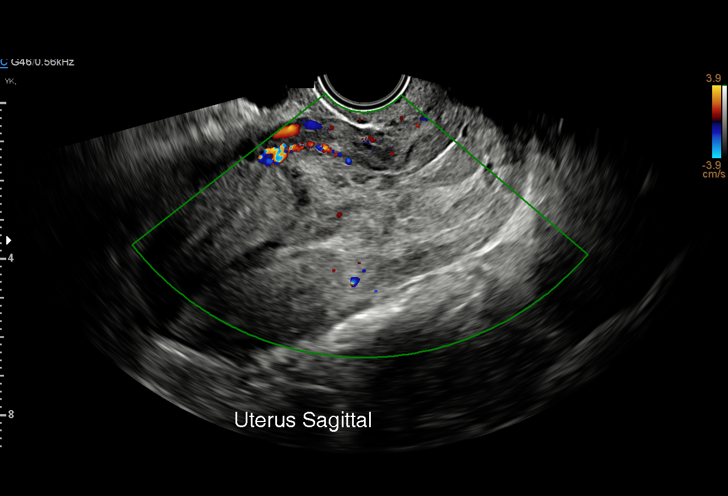
[im 41/58]
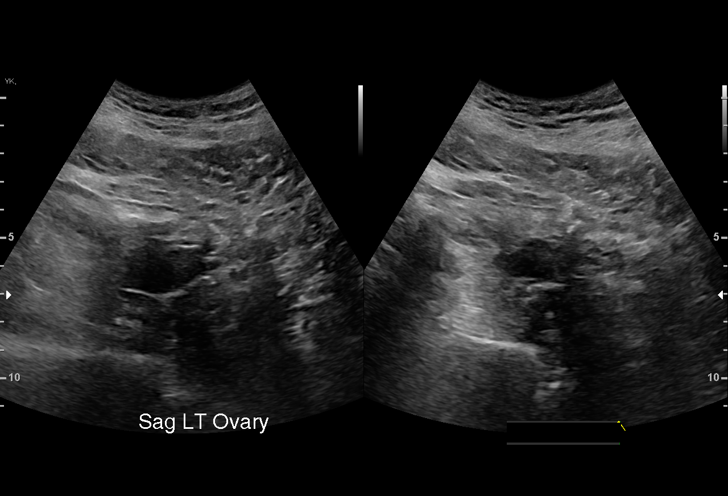
[im 45/58]
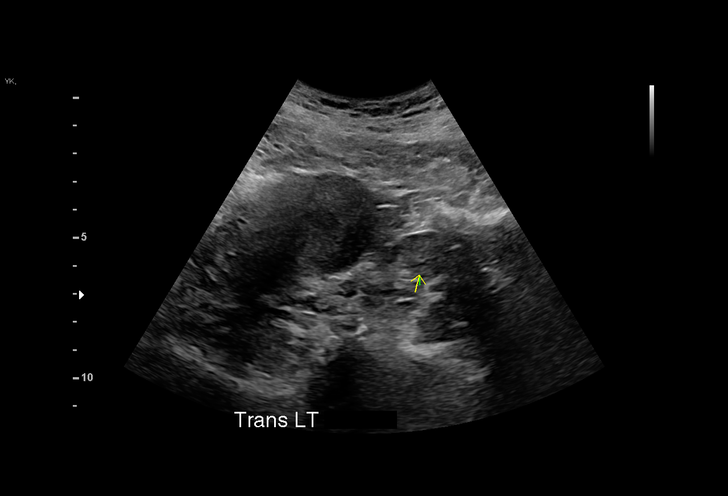
[im 49/58]
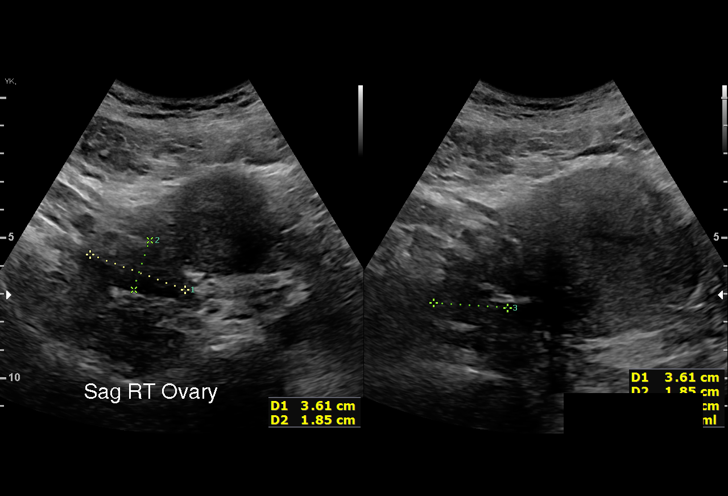
[im 53/58]
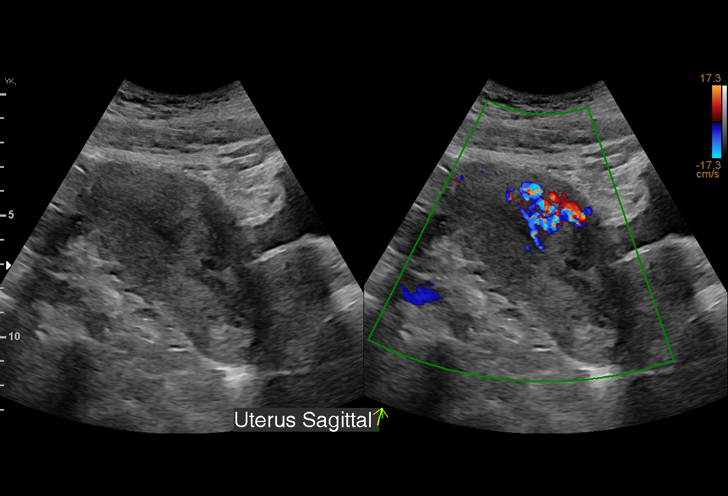
[im 58/58]
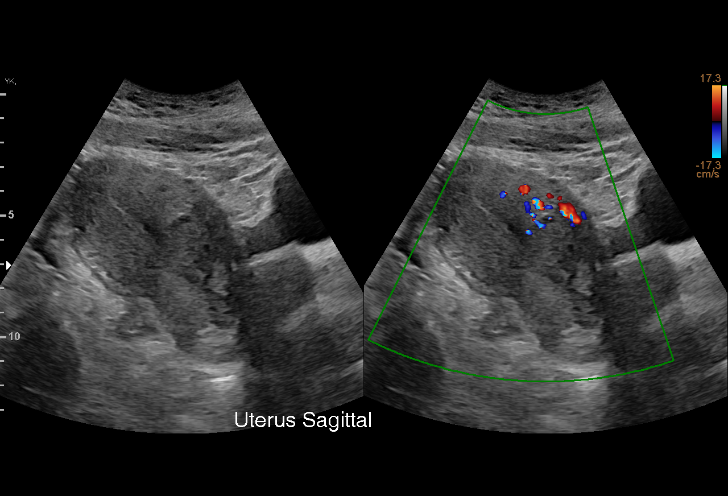

[15 of 28 positions shown; findings below may reference images not displayed]

FINDINGS: Intrauterine gestational sac: Not visualized

Yolk sac:  Not visualized

Embryo:  Not visualized

Cardiac Activity: Not applicable

Subchorionic hemorrhage:  None visualized.

Maternal uterus/adnexae: The uterus is anteverted. There is a
notable area of increased myometrial vascularity within the anterior
fundus. There is thickening of the endometrium which appears
heterogeneous in nature and also demonstrates markedly increased
vascularity within the fundus. Distally, the thickened endometrium
appears relatively hypovascular. As noted above, no discrete
intrauterine gestational sac is identified. The thickened tissue
within the endometrium extends into the cervix and the endocervical
canal appears expanded. The cervix is not fully evaluated on this
examination. A a 4 cm subserosal fibroid is incidentally noted
within the right uterine fundus.

The maternal ovaries are normal in size and echogenicity. No adnexal
masses are seen. There is no free fluid within the pelvis.
IMPRESSION: Thickening of the endometrium demonstrating markedly increased
vascularity and adjacent increased myometrial vascularity. Thickened
endometrial tissue extends into the endocervical canal which appears
expanded. No intrauterine gestational sac is identified. Together,
the findings may reflect changes of an abortion in progress.
Follow-up sonography is recommended in addition to serial
examination of the serum beta HCG levels to document completion.

4 cm subserosal uterine fibroid.

## 2021-10-24 DIAGNOSIS — J309 Allergic rhinitis, unspecified: Secondary | ICD-10-CM | POA: Insufficient documentation

## 2022-03-06 ENCOUNTER — Ambulatory Visit (HOSPITAL_COMMUNITY)
Admission: EM | Admit: 2022-03-06 | Discharge: 2022-03-07 | Disposition: A | Discharge status: Other Acute Inpatient Hospital | Payer: No Payment, Other | Attending: Psychiatry | Admitting: Psychiatry

## 2022-03-06 DIAGNOSIS — R45851 Suicidal ideations: Secondary | ICD-10-CM | POA: Diagnosis not present

## 2022-03-06 DIAGNOSIS — F339 Major depressive disorder, recurrent, unspecified: Secondary | ICD-10-CM | POA: Insufficient documentation

## 2022-03-06 DIAGNOSIS — F1210 Cannabis abuse, uncomplicated: Secondary | ICD-10-CM | POA: Diagnosis not present

## 2022-03-06 DIAGNOSIS — Z20822 Contact with and (suspected) exposure to covid-19: Secondary | ICD-10-CM | POA: Insufficient documentation

## 2022-03-06 LAB — CBC WITH DIFFERENTIAL/PLATELET
Abs Immature Granulocytes: 0.01 K/uL (ref 0.00–0.07)
Basophils Absolute: 0 K/uL (ref 0.0–0.1)
Basophils Relative: 0 %
Eosinophils Absolute: 0.1 K/uL (ref 0.0–0.5)
Eosinophils Relative: 1 %
HCT: 31.9 % — ABNORMAL LOW (ref 36.0–46.0)
Hemoglobin: 10.2 g/dL — ABNORMAL LOW (ref 12.0–15.0)
Immature Granulocytes: 0 %
Lymphocytes Relative: 42 %
Lymphs Abs: 3.1 K/uL (ref 0.7–4.0)
MCH: 27 pg (ref 26.0–34.0)
MCHC: 32 g/dL (ref 30.0–36.0)
MCV: 84.4 fL (ref 80.0–100.0)
Monocytes Absolute: 0.7 K/uL (ref 0.1–1.0)
Monocytes Relative: 10 %
Neutro Abs: 3.6 K/uL (ref 1.7–7.7)
Neutrophils Relative %: 47 %
Platelets: 213 K/uL (ref 150–400)
RBC: 3.78 MIL/uL — ABNORMAL LOW (ref 3.87–5.11)
RDW: 14.5 % (ref 11.5–15.5)
WBC: 7.5 K/uL (ref 4.0–10.5)
nRBC: 0 % (ref 0.0–0.2)

## 2022-03-06 LAB — POCT URINE DRUG SCREEN - MANUAL ENTRY (I-SCREEN)
POC Amphetamine UR: NOT DETECTED
POC Buprenorphine (BUP): NOT DETECTED
POC Cocaine UR: NOT DETECTED
POC Marijuana UR: POSITIVE — AB
POC Methadone UR: NOT DETECTED
POC Methamphetamine UR: NOT DETECTED
POC Morphine: NOT DETECTED
POC Oxazepam (BZO): NOT DETECTED
POC Oxycodone UR: NOT DETECTED
POC Secobarbital (BAR): NOT DETECTED

## 2022-03-06 LAB — HEMOGLOBIN A1C
Hgb A1c MFr Bld: 5.1 % (ref 4.8–5.6)
Mean Plasma Glucose: 99.67 mg/dL

## 2022-03-06 LAB — POCT PREGNANCY, URINE: Preg Test, Ur: NEGATIVE

## 2022-03-06 LAB — ETHANOL: Alcohol, Ethyl (B): <10 mg/dL (ref <10)

## 2022-03-06 LAB — POC SARS CORONAVIRUS 2 AG
SARSCOV2ONAVIRUS 2 AG: NEGATIVE
SARSCOV2ONAVIRUS 2 AG: NEGATIVE

## 2022-03-06 MED ORDER — ALUM & MAG HYDROXIDE-SIMETH 200-200-20 MG/5ML PO SUSP: 30.0000 mL | ORAL | Status: DC | PRN

## 2022-03-06 MED ORDER — HYDROXYZINE HCL 25 MG PO TABS: 25.0000 mg | ORAL_TABLET | Freq: Three times a day (TID) | ORAL | Status: DC | PRN

## 2022-03-06 MED ORDER — MAGNESIUM HYDROXIDE 400 MG/5ML PO SUSP: 30.0000 mL | Freq: Every day | ORAL | Status: DC | PRN

## 2022-03-06 MED ORDER — ACETAMINOPHEN 325 MG PO TABS: 650.0000 mg | ORAL_TABLET | Freq: Four times a day (QID) | ORAL | Status: DC | PRN

## 2022-03-06 NOTE — BH Assessment (Signed)
Comprehensive Clinical Assessment (CCA) Note ? ?03/06/2022 ?CANDEE HOON ?[id number] ? ?Discharge Disposition: ?Evette Georges, NP, reviewed pt's chart and information and met with pt face-to-face and determined pt should receive continuous assessment and be re-assessed by psychiatry in the morning. Pt has been accepted at the [facility]. ? ?The patient demonstrates the following risk factors for suicide: Chronic risk factors for suicide include: psychiatric disorder of MDD, Recurrent, Severe and previous suicide attempts , most recently last week . Acute risk factors for suicide include: social withdrawal/isolation and loss (financial, interpersonal, professional). Protective factors for this patient include: positive social support and hope for the future. Considering these factors, the overall suicide risk at this point appears to be high. Patient is not appropriate for outpatient follow up. ? ?Therefore, a 1:1 sitter is recommended for suicide precautions. ?[facility] ED from 03/06/2022 in [facility] ED from 07/27/2021 in [place] Admission (Discharged) from 05/12/2021 in [place] Assessment Unit  ?C-SSRS RISK CATEGORY High Risk No Risk Moderate Risk  ? ?  ?Chief Complaint:  ?Chief Complaint  ?Patient presents with  ? Suicidal  ? Depression  ? ?Visit Diagnosis: MDD, Recurrent, Severe ? ?CCA Screening, Triage and Referral (STR) ?Faustina Gebert is a 26 year old patient who came to the [facility] due to ongoing SI. Pt states, "I've been having some suicidal ideation. I've had mental health crises for the past 10 years; the suicidal ideation has been really bad the last 2 weeks."  ? ?Pt states last week she took 2 different bottles of anti-depressants and one bottle of muscle relaxers in an attempt to kill herself. She shares a hx of hospitalization for mental health concerns, most recently in 2016. Pt shares she currently has a plan to kill herself by drinking  a bottle of bleach.  ? ?Pt denies HI, AVH, NSSIB, access to guns/weapons, and engagement with the legal system. Pt shares she engages in the use of 2 grams - 1/8 ounce of marijuana daily and that she last used yesterday. She states she engages in the use of EtOH 1x/month and that she typically drinks "a lot" - she estimates approximately 1 bottle of wine. Pt states she last drank last week when she drank the EtOH in addition to taking the medication in an attempt to kill herself. Pt shares she is "scared to go home," sharing that she cannot contract for safety to keep herself safe. ? ?Pt is oriented x5. Her recent/remote memory is intact. Pt was cooperative throughout the assessment process. Pt's insight, judgement, and impulse control is poor at this time. ? ?Patient Reported Information ?How did you hear about [place]? Other (Comment) [facility] recommendation) ? ?What Is the Reason for Your Visit/Call Today? Pt states, "I've been having some suicidal ideation. I've had mental health crises for the past 10 years; the suicidal ideation has been really bad the last 2 weeks." Pt states last week she took 2 different bottles of anti-depressants and one bottle of muscle relaxers in an attempt to kill herself. She shares a hx of hospitalization for mental health concerns, most recently in 2016. Pt shares she currently has a plan to kill herself by drinking a bottle of bleach. Pt denies HI, AVH, NSSIB, access to guns/weapons, and engagement with the legal system. Pt shares she engages in the use of 2 grams - 1/8 ounce of marijuana daily and that she last used yesterday. She states she engages in the use of EtOH 1x/month and that  she typically drinks "a lot" - she estimates approximately 1 bottle of wine. Pt states she last drank last week when she drank the EtOH in addition to taking the medication in an attempt to kill herself. Pt shares she is "scared to go home," sharing that she cannot contract for safety to keep herself  safe. ? ?How Long Has This Been Causing You Problems? 1 wk - 1 month ? ?What Do You Feel Would Help You the Most Today? Medication(s); Treatment for Depression or other mood problem ? ? ?Have You Recently Had Any Thoughts About Hurting Yourself? Yes ? ?Are You Planning to Commit Suicide/Harm Yourself At This time? Yes ? ? ?Have you Recently Had Thoughts About [place]? No ? ?Are You Planning to Harm Someone at This Time? No ? ?Explanation: No data recorded ? ?Have You Used Any Alcohol or Drugs in the Past 24 Hours? Yes ? ?How Long Ago Did You Use Drugs or Alcohol? No data recorded ?What Did You Use and How Much? Pt shares she smoked marijuana yesterday ? ? ?Do You Currently Have a Therapist/Psychiatrist? No ? ?Name of Therapist/Psychiatrist: N/A - pt's medicaitons are filled through her PCP and she stopped seeing her therapist approx 1 month ago due to loss of insurance. ? ? ?Have You Been Recently Discharged From Any Office Practice or Programs? No ? ?Explanation of Discharge From Practice/Program: No data recorded ? ?  ?CCA Screening Triage Referral Assessment ?Type of Contact: Face-to-Face ? ?Telemedicine Service Delivery:   ?Is this Initial or Reassessment? Initial Assessment ? ?Date Telepsych consult ordered in [facility]:  04/25/21 ? ?Time Telepsych consult ordered in [facility]:  No data recorded ?Location of Assessment: [facility] ? ?Provider Location: [facility] ? ? ?Collateral Involvement: None currently ? ? ?Does Patient Have a Stage manager Guardian? No data recorded ?Name and Contact of Legal Guardian: No data recorded ?If Minor and Not Living with Parent(s), Who has Custody? N/A ? ?Is CPS involved or ever been involved? Never ? ?Is APS involved or ever been involved? Never ? ? ?Patient Determined To Be At Risk for Harm To Self or Others Based on Review of Patient Reported Information or Presenting Complaint? Yes, for Self-Harm ? ?Method: No data  recorded ?Availability of Means: No data recorded ?Intent: No data recorded ?Notification Required: No data recorded ?Additional Information for Danger to Others Potential: No data recorded ?Additional Comments for Danger to Others Potential: No data recorded ?Are There Guns or Other Weapons in [place]? No data recorded ?Types of Guns/Weapons: No data recorded ?Are These Weapons Safely Secured?                            No data recorded ?Who Could Verify You Are Able To Have These Secured: No data recorded ?Do You Have any Outstanding Charges, Pending Court Dates, Parole/Probation? No data recorded ?Contacted To Inform of Risk of Harm To Self or Others: Family/Significant Other: (Pt's friends are aware) ? ? ? ?Does Patient Present under Involuntary Commitment? No ? ?IVC Papers Initial File Date: No data recorded ? ?[place] of Residence: [place] ? ? ?Patient Currently Receiving the Following Services: Medication Management ? ? ?Determination of Need: Emergent (2 hours) ? ? ?Options For Referral: Medication Management; Outpatient Therapy; Inpatient Hospitalization ? ? ? ? ?CCA Biopsychosocial ?Patient Reported Schizophrenia/Schizoaffective Diagnosis in Past: No ? ? ?Strengths: Pt has 5 classes to take to graduate with  her bachelor's in sociology. She is able to identify her concerns. Pt is receptive to mental health services. ? ? ?Mental Health Symptoms ?Depression:   ?Change in energy/activity; Difficulty Concentrating; Fatigue; Hopelessness; Irritability; Increase/decrease in appetite; Sleep (too much or little); Tearfulness ?  ?Duration of Depressive symptoms:  ?Duration of Depressive Symptoms: Greater than two weeks ?  ?Mania:   ?None ?  ?Anxiety:    ?Worrying; Tension; Restlessness; Irritability; Fatigue; Difficulty concentrating; Sleep ?  ?Psychosis:   ?None ?  ?Duration of Psychotic symptoms:    ?Trauma:   ?Avoids reminders of event; Difficulty staying/falling asleep; Guilt/shame ?  ?Obsessions:   ?None ?   ?Compulsions:   ?None ?  ?Inattention:   ?None ?  ?Hyperactivity/Impulsivity:   ?None ?  ?Oppositional/Defiant Behaviors:   ?None ?  ?Emotional Irregularity:   ?Mood lability; Potentially harmful impulsivity; Recurrent s

## 2022-03-06 NOTE — ED Provider Notes (Signed)
[facility] Continuous Assessment Admission H&P ? ?Date: 03/07/22 ?Patient Name: Jamie Stevens ?MRN: [id number] ?Chief Complaint:  ?Chief Complaint  ?Patient presents with  ? Suicidal  ? Depression  ?   ? ?Diagnoses:  ?Final diagnoses:  ?Suicidal ideation  ?Marijuana abuse  ?Recurrent major depressive disorder, remission status unspecified (HCC)  ? ? ?HPI: Jamie Stevens, 26 y.o female,  present to [facility] with suicidal Ideation,  per the patient she is still grieving the lost of her pregnancy last year and the lost of both her parents.  Per the patient she is seeing a psychiatry at [facility] and she was seeing a therapist up the 3 weeks go but she lost her insurance.  According to the patient she was diagnose with Depression and ADHD,  she currently take Zoloft and Vyvance .  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  Pt has past SI with OD ? ?Observation of patient,  she is alert and oriented x 4,  speech is clear,  maintain minimal eye contact.  Mood is depressed,  affect flat congruent with mood.  Pt report current suicidal ideation with plan to drink house whole bleach and cut herself.  Pt denies HI, AVH or paranoia,  she report smoking marijuana daily (2 gm or more),  she drink alcohol 1-2 time monthly.  According to pt if she goes home tonight she will definitely act upon her SI.   ? ?Recommend inpatient observation  ? ?PHQ 2-9:  ?Flowsheet Row ED from 03/06/2022 in [facility] Visit [facility]  ?Thoughts that you would be better off dead, or of hurting yourself in some way More than half the days Not at all  ?PHQ-9 Total Score 17 13  ? ?  ?  ?Flowsheet Row ED from 03/06/2022 in [facility] ED from 07/27/2021 in [facility] EMERGENCY DEPARTMENT Admission (Discharged) from 05/12/2021 in [facility] 1S Maternity Assessment Unit   ?C-SSRS RISK CATEGORY High Risk No Risk Moderate Risk  ? ?  ?  ? ?Total Time spent with patient: 20 minutes ? ?Musculoskeletal  ?Strength & Muscle Tone: within normal limits ?Gait & Station: normal ?Patient leans: N/A ? ?Psychiatric Specialty Exam  ?Presentation ?General Appearance: Casual ? ?Eye Contact:Fair ? ?Speech:Clear and Coherent ? ?Speech Volume:Normal ? ?Handedness:Ambidextrous ? ? ?Mood and Affect  ?Mood:Depressed; Hopeless ? ?Affect:Appropriate ? ? ?Thought Process  ?Thought Processes:Coherent ? ?Descriptions of Associations:Circumstantial ? ?Orientation:Full (Time, Place and Person) ? ?Thought Content:Abstract Reasoning ? Diagnosis of Schizophrenia or Schizoaffective disorder in past: No ?  ?Hallucinations:Hallucinations: None ? ?Ideas of Reference:None ? ?Suicidal Thoughts:Suicidal Thoughts: Yes, Active ?SI Active Intent and/or Plan: With Plan ? ?Homicidal Thoughts:Homicidal Thoughts: No ? ? ?Sensorium  ?Memory:Immediate Fair ? ?Judgment:Fair ? ?Insight:Fair ? ? ?Executive Functions  ?Concentration:Fair ? ?Attention Span:Fair ? ?Recall:Fair ? ?Fund of Knowledge:Fair ? ?Language:Fair ? ? ?Psychomotor Activity  ?Psychomotor Activity:Psychomotor Activity: Normal ? ? ?Assets  ?Assets:No data recorded ? ?Sleep  ?Sleep:Sleep: Fair ? ? ?Nutritional Assessment (For OBS and FBC admissions only) ?Has the patient had a weight loss or gain of 10 pounds or more in the last 3 months?: No ?Has the patient had a decrease in food intake/or appetite?: No ?Does the patient have dental problems?: No ?Does the patient have eating habits or behaviors that may be indicators of an eating disorder including  binging or inducing vomiting?: No ?Has the patient recently lost weight without trying?: 0 ?Has the patient been eating poorly because of a decreased appetite?: 0 ?Malnutrition Screening Tool Score: 0 ? ? ? ?Physical Exam ?HENT:  ?   Head: Normocephalic.  ?   Nose: Nose normal.  ?Cardiovascular:  ?   Rate and Rhythm:  Normal rate.  ?Pulmonary:  ?   Effort: Pulmonary effort is normal.  ?Musculoskeletal:     ?   General: Normal range of motion.  ?   Cervical back: Normal range of motion.  ?Skin: ?   General: Skin is warm.  ?Neurological:  ?   General: No focal deficit present.  ?   Mental Status: She is alert.  ?Psychiatric:     ?   Mood and Affect: Mood normal.     ?   Behavior: Behavior normal.     ?   Thought Content: Thought content normal.     ?   Judgment: Judgment normal.  ? ?Review of Systems  ?Constitutional: Negative.   ?HENT: Negative.    ?Eyes: Negative.   ?Respiratory: Negative.    ?Cardiovascular: Negative.   ?Gastrointestinal: Negative.   ?Genitourinary: Negative.   ?Musculoskeletal: Negative.   ?Skin: Negative.   ?Neurological: Negative.   ?Endo/Heme/Allergies: Negative.   ?Psychiatric/Behavioral:  Positive for depression, substance abuse and suicidal ideas. The patient is nervous/anxious.   ? ?Blood pressure (!) 141/83, pulse 96, temperature 98.3 ?F (36.8 ?C), temperature source Oral, resp. rate 18, SpO2 99 %, unknown if currently breastfeeding. There is no height or weight on file to calculate BMI. ? ?Past Psychiatric History: Depression, ADHD  ? ?Is the patient at risk to self? Yes  ?Has the patient been a risk to self in the past 6 months? Yes .    ?Has the patient been a risk to self within the distant past? Yes   ?Is the patient a risk to others? No   ?Has the patient been a risk to others in the past 6 months? No   ?Has the patient been a risk to others within the distant past? No  ? ?Past Medical History:  ?Past Medical History:  ?Diagnosis Date  ? Asthma   ? Depression   ? GERD (gastroesophageal reflux disease)   ? Obesity   ? Panic anxiety syndrome   ? Seasonal allergies   ? Vision abnormalities   ? Pt wears glasses  ?  ?Past Surgical History:  ?Procedure Laterality Date  ? ADENOIDECTOMY    ? TONSILLECTOMY    ? ? ?Family History:  ?Family History  ?Problem Relation Age of Onset  ? Vision loss Mother   ?  Hyperlipidemia Mother   ? Diabetes Mother   ? Cancer Mother   ? Heart disease Father   ? Cancer Father   ? ? ?Social History:  ?Social History  ? ?Socioeconomic History  ? Marital status: Single  ?  Spouse name: Not on file  ? Number of children: Not on file  ? Years of education: Not on file  ? Highest education level: Some college, no degree  ?Occupational History  ? Not on file  ?Tobacco Use  ? Smoking status: Some Days  ?  Passive exposure: Yes  ? Smokeless tobacco: Never  ?Vaping Use  ? Vaping Use: Never used  ?Substance and Sexual Activity  ? Alcohol use: No  ? Drug use: Yes  ?  Types: Marijuana  ? Sexual activity: Not Currently  ?  Birth control/protection: None  ?Other Topics Concern  ? Not on file  ?Social History Narrative  ? Not on file  ? ?Social Determinants of Health  ? ?Financial Resource Strain: Not on file  ?Food Insecurity: Food Insecurity Present  ? Worried About Programme researcher, broadcasting/film/video in the Last Year: Sometimes true  ? Ran Out of Food in the Last Year: Never true  ?Transportation Needs: Unmet Transportation Needs  ? Lack of Transportation (Medical): No  ? Lack of Transportation (Non-Medical): Yes  ?Physical Activity: Not on file  ?Stress: Not on file  ?Social Connections: Not on file  ?Intimate Partner Violence: Not on file  ? ? ?SDOH:  ?SDOH Screenings  ? ?Alcohol Screen: Not on file  ?Depression (PHQ2-9): Medium Risk  ? PHQ-2 Score: 17  ?Financial Resource Strain: Not on file  ?Food Insecurity: Food Insecurity Present  ? Worried About Programme researcher, broadcasting/film/video in the Last Year: Sometimes true  ? Ran Out of Food in the Last Year: Never true  ?Housing: Not on file  ?Physical Activity: Not on file  ?Social Connections: Not on file  ?Stress: Not on file  ?Tobacco Use: High Risk  ? Smoking Tobacco Use: Some Days  ? Smokeless Tobacco Use: Never  ? Passive Exposure: Yes  ?Transportation Needs: Unmet Transportation Needs  ? Lack of Transportation (Medical): No  ? Lack of Transportation (Non-Medical): Yes   ? ? ?Last Labs:  ?Admission on 03/06/2022  ?Component Date Value Ref Range Status  ? SARS Coronavirus 2 by RT PCR 03/06/2022 NEGATIVE  NEGATIVE Final  ? Comment: (NOTE) ?SARS-CoV-2 target nucleic acids are NOT D

## 2022-03-07 ENCOUNTER — Other Ambulatory Visit: Payer: Self-pay

## 2022-03-07 ENCOUNTER — Encounter (HOSPITAL_COMMUNITY): Payer: Self-pay | Admitting: Psychiatry

## 2022-03-07 ENCOUNTER — Inpatient Hospital Stay (HOSPITAL_COMMUNITY)
Admission: AD | Admit: 2022-03-07 | Discharge: 2022-03-13 | DRG: 885 | Disposition: A | Discharge status: Home / Self Care | Payer: Federal, State, Local not specified - Other | Source: Intra-hospital | Attending: Emergency Medicine | Admitting: Emergency Medicine

## 2022-03-07 DIAGNOSIS — G4700 Insomnia, unspecified: Secondary | ICD-10-CM | POA: Diagnosis present

## 2022-03-07 DIAGNOSIS — R4584 Anhedonia: Secondary | ICD-10-CM | POA: Diagnosis present

## 2022-03-07 DIAGNOSIS — Z833 Family history of diabetes mellitus: Secondary | ICD-10-CM

## 2022-03-07 DIAGNOSIS — F1210 Cannabis abuse, uncomplicated: Secondary | ICD-10-CM | POA: Diagnosis present

## 2022-03-07 DIAGNOSIS — Z806 Family history of leukemia: Secondary | ICD-10-CM | POA: Diagnosis not present

## 2022-03-07 DIAGNOSIS — K219 Gastro-esophageal reflux disease without esophagitis: Secondary | ICD-10-CM | POA: Diagnosis present

## 2022-03-07 DIAGNOSIS — Z20822 Contact with and (suspected) exposure to covid-19: Secondary | ICD-10-CM | POA: Diagnosis present

## 2022-03-07 DIAGNOSIS — E669 Obesity, unspecified: Secondary | ICD-10-CM | POA: Diagnosis present

## 2022-03-07 DIAGNOSIS — Z634 Disappearance and death of family member: Secondary | ICD-10-CM | POA: Diagnosis not present

## 2022-03-07 DIAGNOSIS — Z8249 Family history of ischemic heart disease and other diseases of the circulatory system: Secondary | ICD-10-CM

## 2022-03-07 DIAGNOSIS — F410 Panic disorder [episodic paroxysmal anxiety] without agoraphobia: Secondary | ICD-10-CM | POA: Diagnosis present

## 2022-03-07 DIAGNOSIS — Z83438 Family history of other disorder of lipoprotein metabolism and other lipidemia: Secondary | ICD-10-CM

## 2022-03-07 DIAGNOSIS — Z818 Family history of other mental and behavioral disorders: Secondary | ICD-10-CM

## 2022-03-07 DIAGNOSIS — D649 Anemia, unspecified: Secondary | ICD-10-CM | POA: Diagnosis present

## 2022-03-07 DIAGNOSIS — Z821 Family history of blindness and visual loss: Secondary | ICD-10-CM | POA: Diagnosis not present

## 2022-03-07 DIAGNOSIS — F1010 Alcohol abuse, uncomplicated: Secondary | ICD-10-CM | POA: Diagnosis present

## 2022-03-07 DIAGNOSIS — F332 Major depressive disorder, recurrent severe without psychotic features: Principal | ICD-10-CM | POA: Diagnosis present

## 2022-03-07 DIAGNOSIS — R45851 Suicidal ideations: Secondary | ICD-10-CM | POA: Diagnosis present

## 2022-03-07 DIAGNOSIS — Z801 Family history of malignant neoplasm of trachea, bronchus and lung: Secondary | ICD-10-CM | POA: Diagnosis not present

## 2022-03-07 DIAGNOSIS — J45909 Unspecified asthma, uncomplicated: Secondary | ICD-10-CM | POA: Diagnosis present

## 2022-03-07 DIAGNOSIS — E559 Vitamin D deficiency, unspecified: Secondary | ICD-10-CM | POA: Diagnosis present

## 2022-03-07 DIAGNOSIS — Z9151 Personal history of suicidal behavior: Secondary | ICD-10-CM | POA: Diagnosis not present

## 2022-03-07 DIAGNOSIS — F909 Attention-deficit hyperactivity disorder, unspecified type: Secondary | ICD-10-CM | POA: Diagnosis present

## 2022-03-07 DIAGNOSIS — F419 Anxiety disorder, unspecified: Secondary | ICD-10-CM | POA: Diagnosis present

## 2022-03-07 DIAGNOSIS — Z597 Insufficient social insurance and welfare support: Secondary | ICD-10-CM | POA: Diagnosis not present

## 2022-03-07 DIAGNOSIS — N920 Excessive and frequent menstruation with regular cycle: Secondary | ICD-10-CM | POA: Diagnosis present

## 2022-03-07 DIAGNOSIS — K30 Functional dyspepsia: Secondary | ICD-10-CM | POA: Diagnosis present

## 2022-03-07 DIAGNOSIS — F1990 Other psychoactive substance use, unspecified, uncomplicated: Secondary | ICD-10-CM | POA: Diagnosis present

## 2022-03-07 LAB — COMPREHENSIVE METABOLIC PANEL
ALT: 16 U/L (ref 0–44)
AST: 17 U/L (ref 15–41)
Albumin: 3.7 g/dL (ref 3.5–5.0)
Alkaline Phosphatase: 57 U/L (ref 38–126)
Anion gap: 5 (ref 5–15)
BUN: 8 mg/dL (ref 6–20)
CO2: 28 mmol/L (ref 22–32)
Calcium: 9.3 mg/dL (ref 8.9–10.3)
Chloride: 107 mmol/L (ref 98–111)
Creatinine, Ser: 1.12 mg/dL — ABNORMAL HIGH (ref 0.44–1.00)
GFR, Estimated: >60 mL/min (ref >60)
Glucose, Bld: 93 mg/dL (ref 70–99)
Potassium: 3.9 mmol/L (ref 3.5–5.1)
Sodium: 140 mmol/L (ref 135–145)
Total Bilirubin: 1 mg/dL (ref 0.3–1.2)
Total Protein: 7 g/dL (ref 6.5–8.1)

## 2022-03-07 LAB — RESP PANEL BY RT-PCR (FLU A&B, COVID) ARPGX2
Influenza A by PCR: NEGATIVE
Influenza B by PCR: NEGATIVE
SARS Coronavirus 2 by RT PCR: NEGATIVE

## 2022-03-07 LAB — TSH: TSH: 2.586 u[IU]/mL (ref 0.350–4.500)

## 2022-03-07 MED ORDER — ALUM & MAG HYDROXIDE-SIMETH 200-200-20 MG/5ML PO SUSP: 30.0000 mL | ORAL | Status: DC | PRN

## 2022-03-07 MED ORDER — TRAZODONE HCL 50 MG PO TABS
50.0000 mg | ORAL_TABLET | Freq: Every evening | ORAL | Status: DC | PRN
Start: 2022-03-07 — End: 2022-03-10
  Administered 2022-03-07 – 2022-03-09 (×2): 50 mg via ORAL
  Filled 2022-03-07 (×3): qty 1

## 2022-03-07 MED ORDER — HYDROXYZINE HCL 25 MG PO TABS
25.0000 mg | ORAL_TABLET | Freq: Three times a day (TID) | ORAL | Status: DC | PRN
  Administered 2022-03-07 – 2022-03-11 (×2): 25 mg via ORAL
  Filled 2022-03-07 (×3): qty 1

## 2022-03-07 MED ORDER — MAGNESIUM HYDROXIDE 400 MG/5ML PO SUSP: 30.0000 mL | Freq: Every day | ORAL | Status: DC | PRN

## 2022-03-07 MED ORDER — ACETAMINOPHEN 325 MG PO TABS: 650.0000 mg | ORAL_TABLET | Freq: Four times a day (QID) | ORAL | Status: DC | PRN

## 2022-03-07 NOTE — ED Notes (Signed)
Pt transported to [facility] via safe transport due to requiring higher level of care. Pt verbally ad written consented to transfer to [facility]. Continue to endorse passive SI upon discharge. Pt belongings given to safe transport driver from locker #82. Safety maintained. ?

## 2022-03-07 NOTE — ED Provider Notes (Signed)
FBC/OBS ASAP Discharge Summary ? ?Date and Time: 03/07/2022 11:25 AM  ?Name: Jamie Stevens  ?MRN:  [id number]  ? ?Discharge Diagnoses:  ?Final diagnoses:  ?Suicidal ideation  ?Marijuana abuse  ?Recurrent major depressive disorder, remission status unspecified (HCC)  ? ?Subjective:  ? ?Pt assessed face to face by NP today.  ?  ?Pt w/ reported hx of ADHD, depression. ?  ?Pt reports she has been "struggling for a while". Reports depressed mood. Reports mother passed away in Mar 03, 2018 from CHF and CA; father passed away in 2016-03-03 from [place].  ?  ?States she has been experiencing passive and active SI. Has plan to "drink bleach or whatever until I pass". When asked about intent, reports "today, yes". States "see myself hurting myself". ?  ?Denies hx of NSSI.  ?  ?Reports hx of SA, estimates 3 or 4 during lifetime, last occurring 2-3 weeks ago, took 3 bottles of medications (2 bottles of antidepressants; 1 bottle of muscle relaxants). Reports 1 inpatient hospitalization in Mar 04, 2015 after SA (OD). Denies access to a firearm. ?  ?Pt was connected w/ [facility] for counseling, although stopped attending after losing insurance, last attended last month. Pt was receiving medication management at [facility], last saw provider in September or October 2022.  ?  ?Denies VI/HI.  ?  ?Denies AVH, paranoia, delusions.  ?  ?Reports father has had 2 SAs, although does not know of a dx. Reports her aunt carries dx of bipolar disorder and depression.  ?  ?Reports use of MJA, almost daily, estimates 2 g/occasion, last use was on Monday. Reports use of alcohol, last use 2-3 weeks ago, with SA.  ?  ?Pt is recommended for inpatient admission. Discussed recommendation w/ pt. Pt agrees to inpatient admission. ? ?Pt admitted to [facility] for inpatient admission. ? ?Pt is a&ox3. Appears casually dressed, appropriate for environment. Eye contact is minimal. Speech is slow, clear and coherent, w/ decreased volume. Reported mood is depressed. Affect is  congruent, flat, and tearful. TP is coherent, goal directed, linear. Description of associations is intact. TC is logical. There is no evidence of internal preoccupation, distractibility, agitation or aggression. No delusions or paranoia elicited.  ? ?Stay Summary:  ? ?Pt is a 26 y/o female w/ reported hx of ADHD, depression presenting to [facility] on 03/06/22 w/ suicidal ideation. Pt reassessed by NP today. Pt reports multiple SAs. Pt reports passive and active SI, w/ plan, and intent. Recommendation is for inpatient admission. Pt accepted to [facility] for inpatient admission. ? ?Total Time spent with patient: 15 minutes ? ?Past Psychiatric History: Reported hx of ADHD, depression ?Past Medical History:  ?Past Medical History:  ?Diagnosis Date  ? Asthma   ? Depression   ? GERD (gastroesophageal reflux disease)   ? Obesity   ? Panic anxiety syndrome   ? Seasonal allergies   ? Vision abnormalities   ? Pt wears glasses  ?  ?Past Surgical History:  ?Procedure Laterality Date  ? ADENOIDECTOMY    ? TONSILLECTOMY    ? ?Family History:  ?Family History  ?Problem Relation Age of Onset  ? Vision loss Mother   ? Hyperlipidemia Mother   ? Diabetes Mother   ? Cancer Mother   ? Heart disease Father   ? Cancer Father   ? ?Family Psychiatric History: Reports father has had 2 SAs, although does not know of a dx. Reports her aunt carries dx of bipolar disorder and depression.  ?Social History:  ?Social History  ? ?Substance  and Sexual Activity  ?Alcohol Use No  ?   ?Social History  ? ?Substance and Sexual Activity  ?Drug Use Yes  ? Types: Marijuana  ?  ?Social History  ? ?Socioeconomic History  ? Marital status: Single  ?  Spouse name: Not on file  ? Number of children: Not on file  ? Years of education: Not on file  ? Highest education level: Some college, no degree  ?Occupational History  ? Not on file  ?Tobacco Use  ? Smoking status: Some Days  ?  Passive exposure: Yes  ? Smokeless tobacco: Never  ?Vaping Use  ? Vaping Use: Never used   ?Substance and Sexual Activity  ? Alcohol use: No  ? Drug use: Yes  ?  Types: Marijuana  ? Sexual activity: Not Currently  ?  Birth control/protection: None  ?Other Topics Concern  ? Not on file  ?Social History Narrative  ? Not on file  ? ?Social Determinants of Health  ? ?Financial Resource Strain: Not on file  ?Food Insecurity: Food Insecurity Present  ? Worried About Programme researcher, broadcasting/film/videounning Out of Food in the Last Year: Sometimes true  ? Ran Out of Food in the Last Year: Never true  ?Transportation Needs: Unmet Transportation Needs  ? Lack of Transportation (Medical): No  ? Lack of Transportation (Non-Medical): Yes  ?Physical Activity: Not on file  ?Stress: Not on file  ?Social Connections: Not on file  ? ?SDOH:  ?SDOH Screenings  ? ?Alcohol Screen: Not on file  ?Depression (PHQ2-9): Medium Risk  ? PHQ-2 Score: 17  ?Financial Resource Strain: Not on file  ?Food Insecurity: Food Insecurity Present  ? Worried About Programme researcher, broadcasting/film/videounning Out of Food in the Last Year: Sometimes true  ? Ran Out of Food in the Last Year: Never true  ?Housing: Not on file  ?Physical Activity: Not on file  ?Social Connections: Not on file  ?Stress: Not on file  ?Tobacco Use: High Risk  ? Smoking Tobacco Use: Some Days  ? Smokeless Tobacco Use: Never  ? Passive Exposure: Yes  ?Transportation Needs: Unmet Transportation Needs  ? Lack of Transportation (Medical): No  ? Lack of Transportation (Non-Medical): Yes  ? ? ?Tobacco Cessation:  N/A, patient does not currently use tobacco products ? ?Current Medications:  ?Current Facility-Administered Medications  ?Medication Dose Route Frequency Provider Last Rate Last Admin  ? acetaminophen (TYLENOL) tablet 650 mg  650 mg Oral Q6H PRN Sindy GuadeloupeWilliams, Roy, NP      ? alum & mag hydroxide-simeth (MAALOX/MYLANTA) 200-200-20 MG/5ML suspension 30 mL  30 mL Oral Q4H PRN Sindy GuadeloupeWilliams, Roy, NP      ? hydrOXYzine (ATARAX) tablet 25 mg  25 mg Oral TID PRN Sindy GuadeloupeWilliams, Roy, NP      ? magnesium hydroxide (MILK OF MAGNESIA) suspension 30 mL  30 mL  Oral Daily PRN Sindy GuadeloupeWilliams, Roy, NP      ? ?Current Outpatient Medications  ?Medication Sig Dispense Refill  ? albuterol (PROVENTIL HFA;VENTOLIN HFA) 108 (90 Base) MCG/ACT inhaler Inhale 1-2 puffs into the lungs every 4 (four) hours as needed for wheezing or shortness of breath. 3.7 g 0  ? Cetirizine HCl 10 MG CAPS Take 1 capsule (10 mg total) by mouth daily for 10 days. 10 capsule 0  ? ibuprofen (ADVIL) 600 MG tablet Take 1 tablet (600 mg total) by mouth every 6 (six) hours as needed. (Patient not taking: Reported on 05/26/2021) 60 tablet 3  ? sertraline (ZOLOFT) 100 MG tablet Take 100 mg by mouth daily.    ? ? ?  PTA Medications: (Not in a hospital admission) ? ? ?Musculoskeletal  ?Strength & Muscle Tone: within normal limits ?Gait & Station: normal ?Patient leans: N/A ? ?Psychiatric Specialty Exam  ?Presentation  ?General Appearance: Casual; Appropriate for Environment ? ?Eye Contact:Minimal ? ?Speech:Slow; Clear and Coherent ? ?Speech Volume:Decreased ? ?Handedness: ? ?Mood and Affect  ?Mood:Depressed ? ?Affect:Congruent; Tearful; Flat ? ?Thought Process  ?Thought Processes:Coherent; Goal Directed; Linear ? ?Descriptions of Associations:Intact ? ?Orientation:Full (Time, Place and Person) ? ?Thought Content:Logical ? Diagnosis of Schizophrenia or Schizoaffective disorder in past: No ?  ? Hallucinations:Hallucinations: None ? ?Ideas of Reference:None ? ?Suicidal Thoughts:Suicidal Thoughts: Yes, Active ?SI Active Intent and/or Plan: With Plan; With Intent ? ?Homicidal Thoughts:Homicidal Thoughts: No ? ? ?Sensorium  ?Memory:Immediate Fair; Recent Fair ? ?Judgment:Intact ? ?Insight:Fair ? ? ?Executive Functions  ?Concentration:Fair ? ?Attention Span:Fair ? ?Recall:Fair ? ?Fund of Knowledge:Fair ? ?Language:Fair ? ? ?Psychomotor Activity  ?Psychomotor Activity:Psychomotor Activity: Normal ? ? ?Assets  ?Assets:Communication Skills ? ? ?Sleep  ?Sleep:Sleep: Poor ? ? ?Nutritional Assessment (For OBS and FBC admissions  only) ?Has the patient had a weight loss or gain of 10 pounds or more in the last 3 months?: No ?Has the patient had a decrease in food intake/or appetite?: No ?Does the patient have dental problems?: No ?Does the patien

## 2022-03-07 NOTE — ED Notes (Signed)
Patient continues to rest with no sxs of distress - will continue to monitor for safety ?

## 2022-03-07 NOTE — ED Notes (Signed)
Pt continue to endorse SI, no plan or intent noted. Denies HI/AVH. Not forthcoming with questions asked. Soft spoken and tearful. Pt spoke with provider, inpatient recommended. Support and encouragement provided. Will continue to monitor for safety. ?

## 2022-03-07 NOTE — Progress Notes (Signed)
?   03/07/22 1600  ?Psych Admission Type (Psych Patients Only)  ?Admission Status Voluntary  ?Psychosocial Assessment  ?Patient Complaints Depression;Anxiety  ?Eye Contact Brief  ?Facial Expression Anxious;Flat  ?Affect Apprehensive  ?Speech Soft;Slow  ?Interaction Forwards little  ?Motor Activity Slow  ?Appearance/Hygiene Disheveled  ?Behavior Characteristics Cooperative  ?Mood Depressed;Anxious  ?Thought Process  ?Coherency WDL  ?Content WDL  ?Delusions None reported or observed  ?Perception WDL  ?Hallucination None reported or observed  ?Judgment WDL  ?Confusion None  ?Danger to Self  ?Current suicidal ideation? Passive  ?Self-Injurious Behavior No self-injurious ideation or behavior indicators observed or expressed   ?Agreement Not to Harm Self Yes  ?Description of Agreement verbal  ?Danger to Others  ?Danger to Others None reported or observed  ? ? ?

## 2022-03-07 NOTE — Progress Notes (Signed)
Patient ID: Jamie Stevens, female   DOB: 10/06/1996, 26 y.o.   MRN: [id number] ?Admission note: Patient is a 26 yo female admitted voluntarily to [facility] for increased depression and suicide ideation with a plan to overdose on "a bottle of pills." Per TTS assessment and patient, her father passed in 2017 from cancer. Her mother passed in 2019 from CHF and cancer. Per TTS assessment, patient stated she had a plan to "drink bleach or whatever until I pass." Approximately 2-3 weeks ago, patient reports she took 2 bottles of anti-depressant and 1 bottle of muscle relaxants. Patient reports one inpatient hospitalization in 2016. She denies access to firearms. 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 She is connected with [facility] for counseling. She states she no longer has health insurance and her car was repossessed a couple of months ago. She last saw her provider in Sept/Oct. Of 2022. Patient denies any auditory/visual hallucinations. Reports daily use of THC. She states she is a tobacco smoker; however, refuses the patch. Patient also states she is a "social drinker." Patient was oriented to room and unit.  ?

## 2022-03-07 NOTE — Progress Notes (Signed)
[facility] LCSW Progress Note ?  ?03/07/2022    11:22 AM ? ?Jamie Stevens  ? ?[id number]  ? ?Type of Contact and Topic:  Psychiatric Bed Placement  ? ?Pt accepted to [facility] 305-2  ? ?Patient meets inpatient criteria per Darrick Grinder, NP  ? ?The attending provider will be Bartholomew Crews, MD  ? ?Call report to [phone]   ? ?Sharion Dove, RN @ [facility] notified.    ? ?Pt scheduled  to arrive at [facility] TODAY @ 1500. ? ? ?Damita Dunnings, MSW, LCSW-A  ?11:23 AM 03/07/2022   ?  ? ?  ?  ? ? ? ? ?  ?

## 2022-03-07 NOTE — Tx Team (Signed)
Initial Treatment Plan ?03/07/2022 ?3:52 PM ?Jamie Stevens ?OLM:[id number] ? ? ? ?PATIENT STRESSORS: ?Loss of father in 2017 ?Loss of mother in 2019 ?School stressors ?Financial difficulty ? ?PATIENT STRENGTHS: ?Average or above average intelligence  ?Communication skills  ?General fund of knowledge  ?Physical Health  ? ? ?PATIENT IDENTIFIED PROBLEMS: ?Unresolved grief issues  ?Financial difficulty  ?  ?  ?  ?  ?  ?  ?  ?  ? ?DISCHARGE CRITERIA:  ?Need for constant or close observation no longer present ?Reduction of life-threatening or endangering symptoms to within safe limits ? ?PRELIMINARY DISCHARGE PLAN: ?Outpatient therapy ?Return to previous living arrangement ?Return to previous work or school arrangements ? ?PATIENT/FAMILY INVOLVEMENT: ?This treatment plan has been presented to and reviewed with the patient, Jamie Stevens.  The patient and family have been given the opportunity to ask questions and make suggestions. ? ?Cranford Mon, RN ?03/07/2022, 3:52 PM ?

## 2022-03-07 NOTE — Progress Notes (Signed)
?   03/07/22 2200  ?Psych Admission Type (Psych Patients Only)  ?Admission Status Voluntary  ?Psychosocial Assessment  ?Patient Complaints Anxiety;Depression  ?Eye Contact Brief  ?Facial Expression Anxious  ?Affect Apprehensive  ?Speech Slow  ?Interaction Forwards little  ?Motor Activity Slow  ?Appearance/Hygiene Disheveled  ?Behavior Characteristics Cooperative  ?Mood Depressed;Anxious  ?Aggressive Behavior  ?Effect No apparent injury  ?Thought Process  ?Coherency WDL  ?Content WDL  ?Delusions None reported or observed  ?Perception WDL  ?Hallucination None reported or observed  ?Judgment WDL  ?Confusion None  ?Danger to Self  ?Current suicidal ideation? Passive  ?Danger to Others  ?Danger to Others None reported or observed  ? ? ?

## 2022-03-07 NOTE — ED Notes (Signed)
Pt resting in bed with eyes closed. No acute distress noted. Safety maintained. ?

## 2022-03-07 NOTE — ED Notes (Signed)
Pt sleeping in no acute distress. RR even and unlabored. Safety maintained. 

## 2022-03-07 NOTE — Progress Notes (Signed)
Patient did attend the evening speaker NA meeting.  

## 2022-03-07 NOTE — ED Notes (Signed)
Patient stated that she has been very depressed lately. She stated that she has had a lot of loss in the last few years. Her father died in Mar 13, 2016 mother died in 2018-03-13. She also had a miscarriage last year and the father of the child showed no concern for her. She does not have a relationship with her brother or sister. She also does not have a relationship with any aunts or uncles. She feels very alone. She has some good friends but feels that they do not understand her. She was very tearful and stated that she does not want to live anymore.  She is currently resting in bed with eyes closed. Respirations even and unlabored.no S/S of distress. Will continue to monitor for safety. ?

## 2022-03-07 NOTE — ED Notes (Signed)
Report given to Vesta Mixer., RN @[facility]  ?

## 2022-03-08 ENCOUNTER — Encounter (HOSPITAL_COMMUNITY): Payer: Self-pay | Admitting: Psychiatry

## 2022-03-08 DIAGNOSIS — F332 Major depressive disorder, recurrent severe without psychotic features: Secondary | ICD-10-CM | POA: Diagnosis not present

## 2022-03-08 DIAGNOSIS — D649 Anemia, unspecified: Secondary | ICD-10-CM | POA: Diagnosis present

## 2022-03-08 MED ORDER — FLUOXETINE HCL 10 MG PO CAPS
10.0000 mg | ORAL_CAPSULE | Freq: Every day | ORAL | Status: DC
  Administered 2022-03-08 – 2022-03-09 (×2): 10 mg via ORAL
  Filled 2022-03-08 (×5): qty 1
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
## 2022-03-08 NOTE — Progress Notes (Signed)
?   03/08/22 0500  ?Sleep  ?Number of Hours 6.25  ? ? ?
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
## 2022-03-08 NOTE — Progress Notes (Signed)
?   03/08/22 1000  ?Psych Admission Type (Psych Patients Only)  ?Admission Status Voluntary  ?Psychosocial Assessment  ?Patient Complaints Anxiety;Depression  ?Eye Contact Brief  ?Facial Expression Anxious  ?Affect Apprehensive  ?Speech Slow  ?Interaction Isolative;Forwards little  ?Motor Activity Slow  ?Appearance/Hygiene Disheveled  ?Behavior Characteristics Cooperative  ?Mood Depressed;Anxious  ?Aggressive Behavior  ?Effect No apparent injury  ?Thought Process  ?Coherency WDL  ?Content WDL  ?Delusions None reported or observed  ?Perception WDL  ?Hallucination None reported or observed  ?Judgment WDL  ?Confusion None  ?Danger to Self  ?Current suicidal ideation? Denies  ?Self-Injurious Behavior No self-injurious ideation or behavior indicators observed or expressed   ?Agreement Not to Harm Self Yes  ?Description of Agreement Verbal contract for safety  ?Danger to Others  ?Danger to Others None reported or observed  ? ? ?
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
## 2022-03-08 NOTE — Progress Notes (Signed)
[facility] Group Notes:  (Nursing/MHT/Case Management/Adjunct) ? ?Date:  03/08/2022  ?Time:  2015 ? ?Type of Therapy:   wrap up group ? ?Participation Level:  Active ? ?Participation Quality:  Appropriate, Attentive, Sharing, and Supportive ? ?Affect:  Appropriate ? ?Cognitive:  Alert ? ?Insight:  Improving ? ?Engagement in Group:  Engaged ? ?Modes of Intervention:  Clarification, Education, and Support ?  ?Summary of Progress/Problems: Positive thinking and positive change were discussed.  ? ?Johann Capers S ?03/08/2022, 9:01 PM ?

## 2022-03-08 NOTE — [facility] Suicide Risk Assessment (Signed)
[facility] INPATIENT:  Family/Significant Other Suicide Prevention Education ? ?Suicide Prevention Education:  ?Education Completed; Orpah Cobb, friend, [phone],  (name of family member/significant other) has been identified by the patient as the family member/significant other with whom the patient will be residing, and identified as the person(s) who will aid the patient in the event of a mental health crisis (suicidal ideations/suicide attempt).  With written consent from the patient, the family member/significant other has been provided the following suicide prevention education, prior to the and/or following the discharge of the patient. ? ?CSW spoke with patient friend, Amiah.  Friend reports she has been going through a lot recently and friend tries to help out when she can.  Patient does not have family that supports her and patient went through a miscarriage.  Patient will disappear for a while and not engage with them and nothing that they try to do seems to help.  She has supportive friends that try to include her in everything and follow up as much as they can.  Friend stresses that she is afraid one day she will get the call saying that she passed away. She feels like she needs weekly therapy/med management or someone that can assess her regularly right now.  Patient has had a hard time keeping her room clean and friend and another friend plan to go to her place, clean her room and get rid of any medications they can find.  Patient does not own any guns or weapons.  ? ?The suicide prevention education provided includes the following: ?Suicide risk factors ?Suicide prevention and interventions ?National Suicide Hotline telephone number ?[facility] assessment telephone number ?[facility] Emergency Assistance 911 ?[place] and/or Residential Mobile Crisis Unit telephone number ? ?Request made of family/significant other to: ?Remove weapons (e.g., guns, rifles, knives), all items  previously/currently identified as safety concern.   ?Remove drugs/medications (over-the-counter, prescriptions, illicit drugs), all items previously/currently identified as a safety concern. ? ?The family member/significant other verbalizes understanding of the suicide prevention education information provided.  The family member/significant other agrees to remove the items of safety concern listed above. ? ?Syniah Berne E Valjean Ruppel ?03/08/2022, 1:23 PM ?

## 2022-03-08 NOTE — Progress Notes (Signed)
?   03/08/22 2100  ?Psych Admission Type (Psych Patients Only)  ?Admission Status Voluntary  ?Psychosocial Assessment  ?Patient Complaints Anxiety;Depression  ?Eye Contact Brief  ?Facial Expression Anxious  ?Affect Apprehensive  ?Speech Slow  ?Interaction Forwards little  ?Motor Activity Slow  ?Appearance/Hygiene Disheveled  ?Behavior Characteristics Cooperative  ?Mood Depressed  ?Aggressive Behavior  ?Effect No apparent injury  ?Thought Process  ?Coherency WDL  ?Content WDL  ?Delusions None reported or observed  ?Perception WDL  ?Hallucination None reported or observed  ?Judgment WDL  ?Confusion None  ?Danger to Self  ?Current suicidal ideation? Passive  ?Danger to Others  ?Danger to Others None reported or observed  ? ? ?

## 2022-03-09 ENCOUNTER — Encounter (HOSPITAL_COMMUNITY): Payer: Self-pay

## 2022-03-09 DIAGNOSIS — F332 Major depressive disorder, recurrent severe without psychotic features: Secondary | ICD-10-CM | POA: Diagnosis not present

## 2022-03-09 DIAGNOSIS — F1210 Cannabis abuse, uncomplicated: Secondary | ICD-10-CM | POA: Diagnosis present

## 2022-03-09 LAB — BASIC METABOLIC PANEL
Anion gap: 7 (ref 5–15)
BUN: 8 mg/dL (ref 6–20)
CO2: 26 mmol/L (ref 22–32)
Calcium: 9.1 mg/dL (ref 8.9–10.3)
Chloride: 105 mmol/L (ref 98–111)
Creatinine, Ser: 0.8 mg/dL (ref 0.44–1.00)
GFR, Estimated: >60 mL/min (ref >60)
Glucose, Bld: 100 mg/dL — ABNORMAL HIGH (ref 70–99)
Potassium: 3.7 mmol/L (ref 3.5–5.1)
Sodium: 138 mmol/L (ref 135–145)

## 2022-03-09 LAB — IRON AND TIBC
Iron: 42 ug/dL (ref 28–170)
Saturation Ratios: 11 % (ref 10.4–31.8)
TIBC: 380 ug/dL (ref 250–450)
UIBC: 338 ug/dL

## 2022-03-09 LAB — FOLATE: Folate: 8.2 ng/mL (ref >5.9)

## 2022-03-09 LAB — VITAMIN B12: Vitamin B-12: 211 pg/mL (ref 180–914)

## 2022-03-09 LAB — FERRITIN: Ferritin: 12 ng/mL (ref 11–307)

## 2022-03-09 MED ORDER — FLUOXETINE HCL 20 MG PO CAPS
20.0000 mg | ORAL_CAPSULE | Freq: Every day | ORAL | Status: DC
  Administered 2022-03-10 – 2022-03-13 (×4): 20 mg via ORAL
  Filled 2022-03-09: qty 1
  Filled 2022-03-09: qty 7
  Filled 2022-03-09 (×4): qty 1

## 2022-03-09 NOTE — Group Note (Signed)
[place] LCSW Group Therapy Note ? ? ?Group Date: 03/09/2022 ?Start Time: 1300 ?End Time: 1400 ? ? ?Type of Therapy/Topic:  Group Therapy:  Emotion Regulation ? ?Participation Level:  Minimal  ? ?Mood: Sad ? ?Description of Group:   ? The purpose of this group is to assist patients in learning to regulate negative emotions and experience positive emotions. Patients will be guided to discuss ways in which they have been vulnerable to their negative emotions. These vulnerabilities will be juxtaposed with experiences of positive emotions or situations, and patients challenged to use positive emotions to combat negative ones. Special emphasis will be placed on coping with negative emotions in conflict situations, and patients will process healthy conflict resolution skills. ? ?Therapeutic Goals: ?Patient will identify two positive emotions or experiences to reflect on in order to balance out negative emotions:  ?Patient will label two or more emotions that they find the most difficult to experience:  ?Patient will be able to demonstrate positive conflict resolution skills through discussion or role plays:  ? ?Summary of Patient Progress: ? ? ?Patient participated appropriately in group.  She introduced herself but did not speak up for the rest of the group.  Patient silently worked on her worksheet.  Patient shared that the emotion she had today was sad.  ? ? ? ?Therapeutic Modalities:   ?Cognitive Behavioral Therapy ?Feelings Identification ?Dialectical Behavioral Therapy ? ? ?Samani Deal E Tenley Winward, LCSW ?

## 2022-03-09 NOTE — Plan of Care (Signed)
Patient is active in the milieu. Pleasant on approach. Cooperative and denying thoughts of self-harm. Denying hallucinations. Currently attending group and maintains appropriate behavior. No sign of distress.  ?

## 2022-03-09 NOTE — Group Note (Signed)
Recreation Therapy Group Note ? ? ?Group Topic:Stress Management  ?Group Date: 03/09/2022 ?Start Time: 0932 ?End Time: 0950 ?Facilitators: Caroll Rancher, LRT,CTRS ?Location: [address] Day[address] ? ? ?Goal Area(s) Addresses:  ?Patient will actively participate in stress management techniques presented during session.  ?Patient will successfully identify benefit of practicing stress management post d/c.  ?  ?Group Description:  Guided Imagery. LRT provided education, instruction, and demonstration on practice of visualization via guided imagery. Patient was asked to participate in the technique introduced during session. LRT debriefed including topics of mindfulness, stress management and specific scenarios each patient could use these techniques. Patients were given suggestions of ways to access scripts post d/c and encouraged to explore Youtube and other apps available on smartphones, tablets, and computers. ? ? ?Affect/Mood: Appropriate ?  ?Participation Level: Engaged ?  ?Participation Quality: Independent ?  ?Behavior: Appropriate ?  ?Speech/Thought Process: Focused ?  ?Insight: Good ?  ?Judgement: Good ?  ?Modes of Intervention: Script, Ashby Dawes Sounds ?  ?Patient Response to Interventions:  Engaged ?  ?Education Outcome: ? Acknowledges education and In group clarification offered   ? ?Clinical Observations/Individualized Feedback: Pt actively participated in activity.  Pt expressed no concerns at conclusion of group.   ? ? ?Plan: Continue to engage patient in RT group sessions 2-3x/week. ? ? ?Caroll Rancher, LRT,CTRS ?03/09/2022 11:31 AM ?

## 2022-03-09 NOTE — Progress Notes (Signed)
D:  Patient denied SI and HI while talking to nurse today, contracts for safety.   Denied A/V hallucinations. A:  Medications administered per MD orders.  Emotional support and encouragement given patient. R:  Safety maintained with 15 minute checks.  

## 2022-03-09 NOTE — Progress Notes (Addendum)
[facility] Progress Note ? ?03/09/2022 2:25 PM ?Jamie Stevens  ?MRN:  [id number] ? ?Chief Complaint: SI ? ?Reason for Admission:  ?Jamie Stevens is a 26 y.o. female with a history of MDD, recurrent severe, without psychosis and ADHD who was voluntarily admitted from the [facility] for [place] and worsening depression.The patient is currently on Hospital Day 2.  ? ?Chart Review from last 24 hours:  ?The patient's chart was reviewed and nursing notes were reviewed. The patient's case was discussed in multidisciplinary team meeting. Per nurses, she attended groups but had passive SI yesterday. She did contract for safety. She had no acute behavioral issues or safety concerns noted. Per MAR she was compliant with scheduled medication and did not require PRN meds. ? ?Information obtained today: Patient is still feeling down, particularly because this weekend is Mother's day, which is reminding her about the loss of her parents within the past few years and her miscarriage from last year. She is not interested in contacting her aunt today as their relationship has been increasingly tense. Patient reports she attended group session yesterday evening and plans to continue attending them today.She reports she slept about 4-5 hours last night despite documentation by staff that she had better sleep. She has still been having SI with no plan and no HI. She can contract for safety on the unit. She has been eating and drinking okay and denies AVH, paranoia, ideas of reference, or first rank symptoms. She states she had "upset stomach" yesterday which has resolved and is unsure if this was related to what she ate or start of Prozac. She voices no physical complaints today. We discussed dose titration up on Prozac tomorrow which she agrees to and she was encouraged to use PRN Trazodone for sleep. We discussed her anemia, and she states she has been told she is anemic and is supposed to be on iron but stopped this med as an  outpatient for unknown reasons. She is willing to resume iron supplement and we discussed need to f/u with PCP as an outpatient. She was advised that her creatinine has normalized on repeat lab today and was encouraged to eat and drink.  ? ?Principal Problem: MDD (major depressive disorder), recurrent severe, without psychosis ([place]) ?Diagnosis: Principal Problem: ?  MDD (major depressive disorder), recurrent severe, without psychosis ([place]) ?Active Problems: ?  Suicidal ideation ?  Anemia ?  Cannabis abuse ? ?Total Time Spent in Direct Patient Care:  ?I personally spent 30 minutes on the unit in direct patient care. The direct patient care time included face-to-face time with the patient, reviewing the patient's chart, communicating with other professionals, and coordinating care. Greater than 50% of this time was spent in counseling or coordinating care with the patient regarding goals of hospitalization, psycho-education, and discharge planning needs. ? ? ?Past Psychiatric History: see H&P ? ?Past Medical History:  ?Past Medical History:  ?Diagnosis Date  ? Asthma   ? Depression   ? GERD (gastroesophageal reflux disease)   ? Obesity   ? Panic anxiety syndrome   ? Seasonal allergies   ? Vision abnormalities   ? Pt wears glasses  ?  ?Past Surgical History:  ?Procedure Laterality Date  ? ADENOIDECTOMY    ? TONSILLECTOMY    ? ?Family History:  ?Family History  ?Problem Relation Age of Onset  ? Vision loss Mother   ? Hyperlipidemia Mother   ? Diabetes Mother   ? Cancer Mother   ? Heart disease Father   ?  Cancer Father   ? ?Family Psychiatric History: See H&P ? ?Social History:  ?Social History  ? ?Substance and Sexual Activity  ?Alcohol Use No  ?   ?Social History  ? ?Substance and Sexual Activity  ?Drug Use Yes  ? Types: Marijuana  ?  ?Social History  ? ?Socioeconomic History  ? Marital status: Single  ?  Spouse name: Not on file  ? Number of children: Not on file  ? Years of education: Not on file  ? Highest education  level: Some college, no degree  ?Occupational History  ? Not on file  ?Tobacco Use  ? Smoking status: Some Days  ?  Passive exposure: Yes  ? Smokeless tobacco: Never  ?Vaping Use  ? Vaping Use: Never used  ?Substance and Sexual Activity  ? Alcohol use: No  ? Drug use: Yes  ?  Types: Marijuana  ? Sexual activity: Not Currently  ?  Birth control/protection: None  ?Other Topics Concern  ? Not on file  ?Social History Narrative  ? Not on file  ? ?Social Determinants of Health  ? ?Financial Resource Strain: Not on file  ?Food Insecurity: Food Insecurity Present  ? Worried About Charity fundraiser in the Last Year: Sometimes true  ? Ran Out of Food in the Last Year: Never true  ?Transportation Needs: Unmet Transportation Needs  ? Lack of Transportation (Medical): No  ? Lack of Transportation (Non-Medical): Yes  ?Physical Activity: Not on file  ?Stress: Not on file  ?Social Connections: Not on file  ? ? ?Sleep: Poor per patient self-report 6.75 hours per staff ? ?Appetite:  Good ? ?Current Medications: ?Current Facility-Administered Medications  ?Medication Dose Route Frequency Provider Last Rate Last Admin  ? acetaminophen (TYLENOL) tablet 650 mg  650 mg Oral Q6H PRN Tharon Aquas, NP      ? alum & mag hydroxide-simeth (MAALOX/MYLANTA) 200-200-20 MG/5ML suspension 30 mL  30 mL Oral Q4H PRN Tharon Aquas, NP      ? FLUoxetine (PROZAC) capsule 10 mg  10 mg Oral Daily Nelda Marseille, Koden Hunzeker E, MD   10 mg at 03/09/22 [id number]  ? hydrOXYzine (ATARAX) tablet 25 mg  25 mg Oral TID PRN Tharon Aquas, NP   25 mg at 03/07/22 2127  ? magnesium hydroxide (MILK OF MAGNESIA) suspension 30 mL  30 mL Oral Daily PRN Tharon Aquas, NP      ? traZODone (DESYREL) tablet 50 mg  50 mg Oral QHS PRN Tharon Aquas, NP   50 mg at 03/07/22 2127  ? ? ?Lab Results:  ?Results for orders placed or performed during the hospital encounter of 03/07/22 (from the past 48 hour(s))  ?Iron and TIBC     Status: None  ? Collection Time:  03/09/22  6:33 AM  ?Result Value Ref Range  ? Iron 42 28 - 170 ug/dL  ? TIBC 380 250 - 450 ug/dL  ? Saturation Ratios 11 10.4 - 31.8 %  ? UIBC 338 ug/dL  ?  Comment: Performed at [facility], [place] [address], [place], [place] [address]  ?Folate     Status: None  ? Collection Time: 03/09/22  6:33 AM  ?Result Value Ref Range  ? Folate 8.2 >5.9 ng/mL  ?  Comment: Performed at [facility], [place] [address], [place], [place] [address]  ?Vitamin B12     Status: None  ? Collection Time: 03/09/22  6:33 AM  ?Result Value Ref Range  ?  Vitamin B-12 211 180 - 914 pg/mL  ?  Comment: (NOTE) ?This assay is not validated for testing neonatal or ?myeloproliferative syndrome specimens for Vitamin B12 levels. ?Performed at [facility], [place] [address] ?[place], [place] [address] ?  ?Ferritin     Status: None  ? Collection Time: 03/09/22  6:33 AM  ?Result Value Ref Range  ? Ferritin 12 11 - 307 ng/mL  ?  Comment: Performed at [facility], [place] [address], [place], [place] [address]  ?Basic metabolic panel     Status: Abnormal  ? Collection Time: 03/09/22  6:33 AM  ?Result Value Ref Range  ? Sodium 138 135 - 145 mmol/L  ? Potassium 3.7 3.5 - 5.1 mmol/L  ? Chloride 105 98 - 111 mmol/L  ? CO2 26 22 - 32 mmol/L  ? Glucose, Bld 100 (H) 70 - 99 mg/dL  ?  Comment: Glucose reference range applies only to samples taken after fasting for at least 8 hours.  ? BUN 8 6 - 20 mg/dL  ? Creatinine, Ser 0.80 0.44 - 1.00 mg/dL  ? Calcium 9.1 8.9 - 10.3 mg/dL  ? GFR, Estimated >60 >60 mL/min  ?  Comment: (NOTE) ?Calculated using the CKD-EPI Creatinine Equation (2021) ?  ? Anion gap 7 5 - 15  ?  Comment: Performed at [facility], [place] [address] [place], [place] [address]  ? ? ?Blood Alcohol level:  ?Lab Results  ?Component Value Date  ? ETH <10 03/06/2022  ? ? ?Metabolic Disorder Labs: ?Lab Results  ?Component Value Date  ? HGBA1C 5.1 03/06/2022  ? MPG 99.67  03/06/2022  ? ?No results found for: PROLACTIN ?No results found for: CHOL, TRIG, HDL, CHOLHDL, VLDL, LDLCALC ? ?Physical Findings: ?AIMS:  ?Facial and Oral Movements ?Muscles of Facial Expression: None, normal ?Lips

## 2022-03-09 NOTE — Plan of Care (Signed)
Nurse discussed anxiety, depression and coping skills with patient.  

## 2022-03-09 NOTE — BH IP Treatment Plan (Signed)
Interdisciplinary Treatment and Diagnostic Plan Update ? ?03/09/2022 ?Time of Session: 11am ?Jamie NeriNikiah A Stevens ?MRN: [id number] ? ?Principal Diagnosis: MDD (major depressive disorder), recurrent severe, without psychosis (HCC) ? ?Secondary Diagnoses: Principal Problem: ?  MDD (major depressive disorder), recurrent severe, without psychosis (HCC) ?Active Problems: ?  Suicidal ideation ?  Anemia ?  Cannabis abuse ? ? ?Current Medications:  ?Current Facility-Administered Medications  ?Medication Dose Route Frequency Provider Last Rate Last Admin  ? acetaminophen (TYLENOL) tablet 650 mg  650 mg Oral Q6H PRN Lauree ChandlerLee, Jacqueline Eun, NP      ? alum & mag hydroxide-simeth (MAALOX/MYLANTA) 200-200-20 MG/5ML suspension 30 mL  30 mL Oral Q4H PRN Lauree ChandlerLee, Jacqueline Eun, NP      ? Melene Muller[START ON 03/10/2022] FLUoxetine (PROZAC) capsule 20 mg  20 mg Oral Daily Mason JimSingleton, Amy E, MD      ? hydrOXYzine (ATARAX) tablet 25 mg  25 mg Oral TID PRN Lauree ChandlerLee, Jacqueline Eun, NP   25 mg at 03/07/22 2127  ? magnesium hydroxide (MILK OF MAGNESIA) suspension 30 mL  30 mL Oral Daily PRN Lauree ChandlerLee, Jacqueline Eun, NP      ? traZODone (DESYREL) tablet 50 mg  50 mg Oral QHS PRN Lauree ChandlerLee, Jacqueline Eun, NP   50 mg at 03/07/22 2127  ? ?PTA Medications: ?Medications Prior to Admission  ?Medication Sig Dispense Refill Last Dose  ? albuterol (PROVENTIL HFA;VENTOLIN HFA) 108 (90 Base) MCG/ACT inhaler Inhale 1-2 puffs into the lungs every 4 (four) hours as needed for wheezing or shortness of breath. 3.7 g 0   ? Cyanocobalamin (VITAMIN B-12 CR) 1000 MCG TBCR Take 1,000 mcg by mouth daily.     ? doxycycline (VIBRA-TABS) 100 MG tablet Take 100 mg by mouth 2 (two) times daily. Take for 7 days starting on 02/22/22.     ? metroNIDAZOLE (FLAGYL) 500 MG tablet Take 500 mg by mouth 2 (two) times daily. Take for 7 days starting on 02/22/22.     ? sertraline (ZOLOFT) 100 MG tablet Take 100 mg by mouth daily.     ? Vitamin D, Ergocalciferol, (DRISDOL) 1.25 MG (50000 UNIT) CAPS capsule Take 50,000  Units by mouth every Thursday.     ? ? ?Patient Stressors:   ? ?Patient Strengths: Average or above average intelligence  ?Communication skills  ?General fund of knowledge  ?Physical Health  ? ?Treatment Modalities: Medication Management, Group therapy, Case management,  ?1 to 1 session with clinician, Psychoeducation, Recreational therapy. ? ? ?Physician Treatment Plan for Primary Diagnosis: MDD (major depressive disorder), recurrent severe, without psychosis (HCC) ?Long Term Goal(s): Improvement in symptoms so as ready for discharge  ? ?Short Term Goals: Ability to identify triggers associated with substance abuse/mental health issues will improve ?Ability to verbalize feelings will improve ?Ability to disclose and discuss suicidal ideas ?Ability to demonstrate self-control will improve ?Compliance with prescribed medications will improve ? ?Medication Management: Evaluate patient's response, side effects, and tolerance of medication regimen. ? ?Therapeutic Interventions: 1 to 1 sessions, Unit Group sessions and Medication administration. ? ?Evaluation of Outcomes: Progressing ? ?Physician Treatment Plan for Secondary Diagnosis: Principal Problem: ?  MDD (major depressive disorder), recurrent severe, without psychosis (HCC) ?Active Problems: ?  Suicidal ideation ?  Anemia ?  Cannabis abuse ? ?Long Term Goal(s): Improvement in symptoms so as ready for discharge  ? ?Short Term Goals: Ability to identify triggers associated with substance abuse/mental health issues will improve ?Ability to verbalize feelings will improve ?Ability to disclose and discuss suicidal ideas ?Ability to demonstrate  self-control will improve ?Compliance with prescribed medications will improve    ? ?Medication Management: Evaluate patient's response, side effects, and tolerance of medication regimen. ? ?Therapeutic Interventions: 1 to 1 sessions, Unit Group sessions and Medication administration. ? ?Evaluation of Outcomes:  Progressing ? ? ?RN Treatment Plan for Primary Diagnosis: MDD (major depressive disorder), recurrent severe, without psychosis (HCC) ?Long Term Goal(s): Knowledge of disease and therapeutic regimen to maintain health will improve ? ?Short Term Goals: Ability to remain free from injury will improve, Ability to verbalize frustration and anger appropriately will improve, Ability to demonstrate self-control, Ability to participate in decision making will improve, Ability to verbalize feelings will improve, Ability to disclose and discuss suicidal ideas, Ability to identify and develop effective coping behaviors will improve, and Compliance with prescribed medications will improve ? ?Medication Management: RN will administer medications as ordered by provider, will assess and evaluate patient's response and provide education to patient for prescribed medication. RN will report any adverse and/or side effects to prescribing provider. ? ?Therapeutic Interventions: 1 on 1 counseling sessions, Psychoeducation, Medication administration, Evaluate responses to treatment, Monitor vital signs and CBGs as ordered, Perform/monitor CIWA, COWS, AIMS and Fall Risk screenings as ordered, Perform wound care treatments as ordered. ? ?Evaluation of Outcomes: Progressing ? ? ?LCSW Treatment Plan for Primary Diagnosis: MDD (major depressive disorder), recurrent severe, without psychosis (HCC) ?Long Term Goal(s): Safe transition to appropriate next level of care at discharge, Engage patient in therapeutic group addressing interpersonal concerns. ? ?Short Term Goals: Engage patient in aftercare planning with referrals and resources, Increase social support, Increase ability to appropriately verbalize feelings, Increase emotional regulation, Facilitate acceptance of mental health diagnosis and concerns, Facilitate patient progression through stages of change regarding substance use diagnoses and concerns, Identify triggers associated with  mental health/substance abuse issues, and Increase skills for wellness and recovery ? ?Therapeutic Interventions: Assess for all discharge needs, 1 to 1 time with Child psychotherapist, Explore available resources and support systems, Assess for adequacy in community support network, Educate family and significant other(s) on suicide prevention, Complete Psychosocial Assessment, Interpersonal group therapy. ? ?Evaluation of Outcomes: Progressing ? ? ?Progress in Treatment: ?Attending groups: Yes. ?Participating in groups: Yes. ?Taking medication as prescribed: Yes. ?Toleration medication: Yes. ?Family/Significant other contact made: Yes, individual(s) contacted:  friend, Amiah ?Patient understands diagnosis: Yes. ?Discussing patient identified problems/goals with staff: Yes. ?Medical problems stabilized or resolved: Yes. ?Denies suicidal/homicidal ideation: Yes. ?Issues/concerns per patient self-inventory: No ?Other: None ? ?New problem(s) identified: No, Describe:  none reported ? ?New Short Term/Long Term Goal(s):  ? ?medication stabilization, elimination of SI thoughts, development of comprehensive mental wellness plan.  ? ? ?Patient Goals:  Patient states that she would like to get out of current funk and start to engage with others  ? ?Discharge Plan or Barriers: Patient recently admitted. CSW will continue to follow and assess for appropriate referrals and possible discharge planning.  ? ? ?Reason for Continuation of Hospitalization: Anxiety ?Depression ?Medication stabilization ?Suicidal ideation ? ?Estimated Length of Stay: 3-5 days ? ?Last 3 [place] Suicide Severity Risk Score: ?Flowsheet Row [facility] (Current[facility]2022 in [facility] from 07/27/2021 in [facility]  ?C-SSRS RISK CATEGORY High Risk High Risk No Risk  ? ?  ? ? ?Last PHQ 2/9 Scores: ? ?  03/06/2022  ? 10:03 PM 05/05/2021   ? 11:43 AM  ?Depression screen PHQ 2/9  ?Decreased Interest  2 1  ?Down, Depressed, Hopeless 3 1  ?PHQ - 2 Score 5 2  ?Altered sleeping 3 3  ?Tired, decreased energy 3 2  ?Change in appetite 1 2  ?Feeling bad or failure abo

## 2022-03-09 NOTE — Progress Notes (Signed)
?   03/09/22 0500  ?Sleep  ?Number of Hours 6.75  ? ? ?

## 2022-03-10 DIAGNOSIS — F332 Major depressive disorder, recurrent severe without psychotic features: Secondary | ICD-10-CM | POA: Diagnosis not present

## 2022-03-10 MED ORDER — ALBUTEROL SULFATE HFA 108 (90 BASE) MCG/ACT IN AERS: 2.0000 | INHALATION_SPRAY | Freq: Four times a day (QID) | RESPIRATORY_TRACT | Status: DC | PRN

## 2022-03-10 MED ORDER — TRAZODONE HCL 100 MG PO TABS
100.0000 mg | ORAL_TABLET | Freq: Every evening | ORAL | Status: DC | PRN
  Administered 2022-03-10: 100 mg via ORAL
  Filled 2022-03-10: qty 1

## 2022-03-10 MED ORDER — MELATONIN 3 MG PO TABS
6.0000 mg | ORAL_TABLET | Freq: Every evening | ORAL | Status: DC | PRN
  Administered 2022-03-10: 6 mg via ORAL
  Filled 2022-03-10: qty 2

## 2022-03-10 MED ORDER — VITAMIN D (ERGOCALCIFEROL) 1.25 MG (50000 UNIT) PO CAPS
50000.0000 [IU] | ORAL_CAPSULE | ORAL | Status: DC
  Filled 2022-03-10: qty 1

## 2022-03-10 MED ORDER — MELATONIN 3 MG PO TABS: 6.0000 mg | ORAL_TABLET | Freq: Every evening | ORAL | Status: DC | PRN

## 2022-03-10 MED ORDER — METRONIDAZOLE 500 MG PO TABS
500.0000 mg | ORAL_TABLET | Freq: Two times a day (BID) | ORAL | Status: DC
  Administered 2022-03-10 – 2022-03-13 (×7): 500 mg via ORAL
  Filled 2022-03-10 (×9): qty 1
  Filled 2022-03-10 (×2): qty 7

## 2022-03-10 MED ORDER — DOXYCYCLINE HYCLATE 100 MG PO TABS
100.0000 mg | ORAL_TABLET | Freq: Two times a day (BID) | ORAL | Status: DC
  Administered 2022-03-10 – 2022-03-13 (×7): 100 mg via ORAL
  Filled 2022-03-10: qty 7
  Filled 2022-03-10 (×4): qty 1
  Filled 2022-03-10: qty 7
  Filled 2022-03-10 (×5): qty 1

## 2022-03-10 MED ORDER — VITAMIN B-12 1000 MCG PO TABS
1000.0000 ug | ORAL_TABLET | Freq: Every day | ORAL | Status: DC
  Administered 2022-03-10 – 2022-03-13 (×4): 1000 ug via ORAL
  Filled 2022-03-10 (×7): qty 1

## 2022-03-10 NOTE — Group Note (Signed)
LCSW Group Therapy Note ? ?03/10/2022   10:30-11:30am  ? ?Topic:  Anger and its Underlying Emotions ? ?Participation Level:  Active ? ?Description of Group:   ?In this group, patients identified the primary emotions they often have in situations that eventually provoke them to anger.  TFocus was placed on how helpful it is to recognize the underlying emotions to anger in order to address these for more permanent resolution.  Emphasis was also on identifying possible replacement thoughts for the automatic thoughts generated in various situations shared by the group. ? ?Therapeutic Goals: ?Patients will share emotions that commonly incite their anger and how they typically respond ?Patients will identify how their coping skills work for them and/or against them ?Patients will explore possible alternative thoughts to their automatic ones ?Patients will learn that anger itself is normal and that healthier reactions can assist with resolving conflict rather than worsening situations ? ?Summary of Patient Progress:  The patient shared that her frequent precursor emotion to anger is being misunderstood and unloved.  Patient indicated a willingness to try working on the underlying emotions in order to feel better both in the short-term and in the long-run. ? ?Therapeutic Modalities:   ?Cognitive Behavioral Therapy ?Processing ? ?Lynnell Chad, MSW, LCSW  ?

## 2022-03-10 NOTE — Progress Notes (Signed)
[facility] Physician Progress Note ? ?03/10/2022 7:47 AM ?Lavone Neri  ?MRN:  [id number] ? ?Chief Complaint: SI ? ?Reason for Admission:  ?Jamie Stevens is a 26 y.o. female with a history of MDD, recurrent severe, without psychosis and ADHD who was voluntarily admitted from the [facility] for SI and worsening depression.The patient is currently on Hospital Day 3.  ? ?Chart Review from last 24 hours:  ?The patient's chart was reviewed and nursing notes were reviewed. The patient's case was discussed in multidisciplinary team meeting. Per nurses,she denied SI or HI and had no acute behavioral issues or safety concerns noted. She attended groups.  Per MAR she was compliant with scheduled medication and did not require PRNs other than Trazodone for sleep. ? ?Information obtained today: The patient was seen and interviewed in her room. She reports that she did not sleep well last night due to ruminating thoughts. She is thinking about this being mother's day weekend which is a trigger after losing her own mother and having a miscarriage last year. She states she is also ruminating about how her family has treated her since her parents' deaths. She states she has been attending groups and we discussed potential for PHP after discharge which she agrees to consider. She reports passive SI without intent or plan and can contract for safety on the unit. She denies AVH, HI, paranoia, delusions, ideas of reference or first rank symptoms. She states her appetite is "okay" and she voices no physical complaints. When asked about report that she was supposed to be a course of doxycycline and flagyl prior to admission, she states she was diagnosed with BV and chlamydia but did not take more than a few days of the medication before going out of town and then misplacing the meds. I advised that we restart a 7 day course of each since we don't know how many days of the meds she took in an effort to make sure the STD is treated. She has been  restarted on home B12 and Vit D supplements. She states she has tolerated the increase in Prozac without noted side-effects. We will increase Trazodone for sleep and good sleep hygiene was encouraged. We discussed that her iron panels are WNL and that her anemia may be related to blood loss. She is asymptomatic and admits she has been having heavy periods and was supposed to have an u/s which she did get as an outpatient. ? ?Principal Problem: MDD (major depressive disorder), recurrent severe, without psychosis (HCC) ?Diagnosis: Principal Problem: ?  MDD (major depressive disorder), recurrent severe, without psychosis (HCC) ?Active Problems: ?  Suicidal ideation ?  Anemia ?  Cannabis abuse ? ?Total Time Spent in Direct Patient Care:  ?I personally spent 25 minutes on the unit in direct patient care. The direct patient care time included face-to-face time with the patient, reviewing the patient's chart, communicating with other professionals, and coordinating care. Greater than 50% of this time was spent in counseling or coordinating care with the patient regarding goals of hospitalization, psycho-education, and discharge planning needs. ? ? ?Past Psychiatric History: see H&P ? ?Past Medical History:  ?Past Medical History:  ?Diagnosis Date  ? Asthma   ? Depression   ? GERD (gastroesophageal reflux disease)   ? Obesity   ? Panic anxiety syndrome   ? Seasonal allergies   ? Vision abnormalities   ? Pt wears glasses  ?  ?Past Surgical History:  ?Procedure Laterality Date  ? ADENOIDECTOMY    ?  TONSILLECTOMY    ? ?Family History:  ?Family History  ?Problem Relation Age of Onset  ? Vision loss Mother   ? Hyperlipidemia Mother   ? Diabetes Mother   ? Cancer Mother   ? Heart disease Father   ? Cancer Father   ? ?Family Psychiatric History: See H&P ? ?Social History:  ?Social History  ? ?Substance and Sexual Activity  ?Alcohol Use No  ?   ?Social History  ? ?Substance and Sexual Activity  ?Drug Use Yes  ? Types: Marijuana  ?   ?Social History  ? ?Socioeconomic History  ? Marital status: Single  ?  Spouse name: Not on file  ? Number of children: Not on file  ? Years of education: Not on file  ? Highest education level: Some college, no degree  ?Occupational History  ? Not on file  ?Tobacco Use  ? Smoking status: Some Days  ?  Passive exposure: Yes  ? Smokeless tobacco: Never  ?Vaping Use  ? Vaping Use: Never used  ?Substance and Sexual Activity  ? Alcohol use: No  ? Drug use: Yes  ?  Types: Marijuana  ? Sexual activity: Not Currently  ?  Birth control/protection: None  ?Other Topics Concern  ? Not on file  ?Social History Narrative  ? Not on file  ? ?Social Determinants of Health  ? ?Financial Resource Strain: Not on file  ?Food Insecurity: Food Insecurity Present  ? Worried About Programme researcher, broadcasting/film/video in the Last Year: Sometimes true  ? Ran Out of Food in the Last Year: Never true  ?Transportation Needs: Unmet Transportation Needs  ? Lack of Transportation (Medical): No  ? Lack of Transportation (Non-Medical): Yes  ?Physical Activity: Not on file  ?Stress: Not on file  ?Social Connections: Not on file  ? ? ?Sleep: Poor  ? ?Appetite:  Fair ? ?Current Medications: ?Current Facility-Administered Medications  ?Medication Dose Route Frequency Provider Last Rate Last Admin  ? acetaminophen (TYLENOL) tablet 650 mg  650 mg Oral Q6H PRN Lauree Chandler, NP      ? alum & mag hydroxide-simeth (MAALOX/MYLANTA) 200-200-20 MG/5ML suspension 30 mL  30 mL Oral Q4H PRN Lauree Chandler, NP      ? FLUoxetine (PROZAC) capsule 20 mg  20 mg Oral Daily Mason Jim, Malvina Schadler E, MD      ? hydrOXYzine (ATARAX) tablet 25 mg  25 mg Oral TID PRN Lauree Chandler, NP   25 mg at 03/07/22 2127  ? magnesium hydroxide (MILK OF MAGNESIA) suspension 30 mL  30 mL Oral Daily PRN Lauree Chandler, NP      ? traZODone (DESYREL) tablet 50 mg  50 mg Oral QHS PRN Lauree Chandler, NP   50 mg at 03/09/22 2126  ? ? ?Lab Results:  ?Results for orders placed or performed  during the hospital encounter of 03/07/22 (from the past 48 hour(s))  ?Iron and TIBC     Status: None  ? Collection Time: 03/09/22  6:33 AM  ?Result Value Ref Range  ? Iron 42 28 - 170 ug/dL  ? TIBC 380 250 - 450 ug/dL  ? Saturation Ratios 11 10.4 - 31.8 %  ? UIBC 338 ug/dL  ?  Comment: Performed at [facility], [address], [place], [place] [address]  ?Folate     Status: None  ? Collection Time: 03/09/22  6:33 AM  ?Result Value Ref Range  ? Folate 8.2 >5.9 ng/mL  ?  Comment: Performed  at [facility], [address] [place], [place] [address]  ?Vitamin B12     Status: None  ? Collection Time: 03/09/22  6:33 AM  ?Result Value Ref Range  ? Vitamin B-12 211 180 - 914 pg/mL  ?  Comment: (NOTE) ?This assay is not validated for testing neonatal or ?myeloproliferative syndrome specimens for Vitamin B12 levels. ?Performed at [facility], [address] ?[place], [place] [address] ?  ?Ferritin     Status: None  ? Collection Time: 03/09/22  6:33 AM  ?Result Value Ref Range  ? Ferritin 12 11 - 307 ng/mL  ?  Comment: Performed at [facility], [address] [place], [place] [address]  ?Basic metabolic panel     Status: Abnormal  ? Collection Time: 03/09/22  6:33 AM  ?Result Value Ref Range  ? Sodium 138 135 - 145 mmol/L  ? Potassium 3.7 3.5 - 5.1 mmol/L  ? Chloride 105 98 - 111 mmol/L  ? CO2 26 22 - 32 mmol/L  ? Glucose, Bld 100 (H) 70 - 99 mg/dL  ?  Comment: Glucose reference range applies only to samples taken after fasting for at least 8 hours.  ? BUN 8 6 - 20 mg/dL  ? Creatinine, Ser 0.80 0.44 - 1.00 mg/dL  ? Calcium 9.1 8.9 - 10.3 mg/dL  ? GFR, Estimated >60 >60 mL/min  ?  Comment: (NOTE) ?Calculated using the CKD-EPI Creatinine Equation (2021) ?  ? Anion gap 7 5 - 15  ?  Comment: Performed at [facility], [address] [place], [place] [address]  ? ? ?Blood Alcohol level:  ?Lab Results  ?Component Value Date  ? ETH  <10 03/06/2022  ? ? ?Metabolic Disorder Labs: ?Lab Results  ?Component Value Date  ? HGBA1C 5.1 03/06/2022  ? MPG 99.67 03/06/2022  ? ?No results found for: PROLACTIN ?No results found for: CHOL, TRIG, HDL, CH

## 2022-03-10 NOTE — [facility] Group Notes (Signed)
.  Psychoeducational Group Note ? ? ? ?Date:  5/13//23 ?Time: 1300-1400 ? ? ? ?Purpose of Group: . The group focus' on teaching patients on how to identify their needs and their Life Skills:  A group where two lists are made. What people need and what are things that we do that are unhealthy. The lists are developed by the patients and it is explained that we often do the actions that are not healthy to get our list of needs met. ? ?Goal:: to develop the coping skills needed to get their needs met ? ?Participation Level:  Active ? ?Participation Quality:  Appropriate ? ?Affect:  Appropriate ? ?Cognitive:  Oriented ? ?Insight:  Improving ? ?Engagement in Group:  Engaged ? ?Additional Comments: Rates energy at a 4/10. Was quiet in the group, but was paying close attention to what was being said. ? ?Jamie Stevens A ?

## 2022-03-10 NOTE — [facility] Group Notes (Signed)
Psychoeducational Group Note ? ?Date: 03/10/2022 ?Time: 0900-1000 ? ? ? ?Goal Setting  ? ?Purpose of Group: This group helps to provide patients with the steps of setting Stevens goal that is specific, measurable, attainable, realistic and time specific. Stevens discussion on how we keep ourselves stuck with negative self talk. Homework given for Patients to write 30 positive attributes about themselves. ? ? ? ?Participation Level:  Active ? ?Participation Quality:  Appropriate ? ?Affect:  Appropriate ? ?Cognitive:  Appropriate ? ?Insight:  Improving ? ?Engagement in Group:  Engaged ? ?Additional Comments: Pt rates her energy at Stevens 5/10. Participated in the group.  ? ?Jamie Stevens ?

## 2022-03-10 NOTE — Progress Notes (Signed)
The patient shared in group that her day was rated as a 8 out of 10. She states that she socialized with her peers and "stopped worrying" today.  ?

## 2022-03-10 NOTE — Progress Notes (Signed)
?   03/10/22 2021  ?Psych Admission Type (Psych Patients Only)  ?Admission Status Voluntary  ?Psychosocial Assessment  ?Patient Complaints Insomnia  ?Eye Contact Fair  ?Facial Expression Flat  ?Affect Appropriate to circumstance  ?Speech Logical/coherent  ?Interaction Assertive  ?Motor Activity Other (Comment) ?(WDL)  ?Appearance/Hygiene Improved  ?Behavior Characteristics Appropriate to situation  ?Mood Depressed  ?Thought Process  ?Coherency WDL  ?Content WDL  ?Delusions None reported or observed  ?Perception WDL  ?Hallucination None reported or observed  ?Judgment Impaired  ?Confusion None  ?Danger to Self  ?Current suicidal ideation? Passive  ?Self-Injurious Behavior No self-injurious ideation or behavior indicators observed or expressed   ?Agreement Not to Harm Self Yes  ?Description of Agreement verbal  ?Danger to Others  ?Danger to Others None reported or observed  ? ? ?

## 2022-03-10 NOTE — Progress Notes (Addendum)
D. Pt presented with a depressed affect/ mood- reported that she slept poorly last night and complained that she was very sleepy today. Per pt's self inventory, pt rated her depression, hopelessness and anxiety a 6/3/5, respectively. Pt reported that her goal was "to try to stop worrying", and "try to get involved with my peers." Pt endorsed passive SI but contracted for safety. Pt denied HI, AVH, and does not appear to be responding to internal stimuli. Pt observed attending group today ?A. Labs and vitals monitored. Pt given and educated on medications. Pt supported emotionally and encouraged to express concerns and ask questions.   ?R. Pt remains safe with 15 minute checks. Will continue POC. ? ?  ?

## 2022-03-11 DIAGNOSIS — F332 Major depressive disorder, recurrent severe without psychotic features: Secondary | ICD-10-CM | POA: Diagnosis not present

## 2022-03-11 MED ORDER — MELATONIN 3 MG PO TABS: 6.0000 mg | ORAL_TABLET | Freq: Every evening | ORAL | Status: DC | PRN

## 2022-03-11 MED ORDER — MIRTAZAPINE 7.5 MG PO TABS
7.5000 mg | ORAL_TABLET | Freq: Every day | ORAL | Status: DC
  Administered 2022-03-11: 7.5 mg via ORAL
  Filled 2022-03-11 (×3): qty 1

## 2022-03-11 NOTE — [facility] Group Notes (Signed)
Adult Psychoeducational Group  ?Date:  03/11/2022 ?Time:  1300-1400 ? ?Group Topic/Focus: Continuation of the group from Saturday. Looking at the lists that were created and talking about what needs to be done with the homework of 30 positives about themselves.  ?                                   Talking about taking their power back and helping themselves to develop Stevens positive self esteem. ?     ?Participation Quality:  Appropriate ? ?Affect:  Appropriate ? ?Cognitive:  Oriented ? ?Insight: Improving ? ?Engagement in Group:  Engaged ? ?Modes of Intervention:  Activity, Discussion, Education, and Support ? ?Additional Comments:  Rates her energy at an 8/10. Participated fully in the group. ? ?Jamie Stevens ? ?

## 2022-03-11 NOTE — Progress Notes (Signed)
?   03/11/22 2353  ?Psych Admission Type (Psych Patients Only)  ?Admission Status Voluntary  ?Psychosocial Assessment  ?Patient Complaints Insomnia  ?Eye Contact Fair  ?Facial Expression Flat  ?Affect Appropriate to circumstance  ?Speech Logical/coherent  ?Interaction Assertive  ?Motor Activity Other (Comment) ?(WDL)  ?Appearance/Hygiene Unremarkable  ?Behavior Characteristics Appropriate to situation  ?Mood Pleasant  ?Thought Process  ?Coherency WDL  ?Content WDL  ?Delusions None reported or observed  ?Perception WDL  ?Hallucination None reported or observed  ?Judgment Impaired  ?Confusion None  ?Danger to Self  ?Current suicidal ideation? Denies  ?Self-Injurious Behavior No self-injurious ideation or behavior indicators observed or expressed   ?Agreement Not to Harm Self Yes  ?Description of Agreement verbal  ?Danger to Others  ?Danger to Others None reported or observed  ? ? ?

## 2022-03-11 NOTE — [facility] Group Notes (Signed)
Adult Psychoeducational Group Not ?Date:  03/11/2022 ?Time:  4097-3532 ?Group Topic/Focus: PROGRESSIVE RELAXATION. Stevens group where deep breathing is taught and tensing and relaxation muscle groups is used. Imagery is used as well.  Pts are asked to imagine 3 pillars that hold them up when they are not able to hold themselves up and to share that with the group. ? ?Participation Level:  Active ? ?Participation Quality:  Appropriate ? ?Affect:  Appropriate ? ?Cognitive:  Oriented ? ?Insight: Improving ? ?Engagement in Group:  Engaged ? ?Modes of Intervention:  Activity, Discussion, Education, and Support ? ?Additional Comments:  Rates her energy at Stevens 3.10. States her friends, her dog and music hold her up. ? ?Jamie Stevens ? ? ?

## 2022-03-11 NOTE — Progress Notes (Signed)
[facility] Group Notes:  (Nursing/MHT/Case Management/Adjunct) ? ?Date:  03/11/2022  ?Time:  2015 ? ?Type of Therapy:   wrap up group ? ?Participation Level:  Active ? ?Participation Quality:  Appropriate, Attentive, Sharing, and Supportive ? ?Affect:  Appropriate ? ?Cognitive:  Alert ? ?Insight:  Improving ? ?Engagement in Group:  Engaged ? ?Modes of Intervention:  Clarification, Education, and Support ? ?Summary of Progress/Problems: Positive thinking and self-care were discussed.  ? ?Jamie Stevens ?03/11/2022, 9:10 PM ?
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
## 2022-03-11 NOTE — Group Note (Signed)
[facility] LCSW Group Therapy Note ? ?03/11/2022  10:00-11:00AM ? ?Type of Therapy and Topic:  Group Therapy:  Acknowledging and Resolving Issues with Mothers ? ?Participation Level:  Active  ? ?Description of Group:   Patients in this group were asked to briefly describe their experience with the mother figure(s) in their lives, both in childhood and adulthood.  Different types of support provided by these individuals were identified.   Patients were then encouraged to determine whether their mother figure was or is a healthy or unhealthy support.  The manner in which that early relationship has shaped patient's feelings and life decisions was pointed out and acknowledged.  Group members gave support to each other.  CSW led a discussion on how helpful it can be to resolve past issues, and how this can be done whether the mother figure is now alive or already deceased.  An emphasis was placed on continuing to work with a therapist on these issues  when patients leave the hospital in order to be able to focus on the future instead of the past, to continue becoming healthier and happier.  ? ?Therapeutic Goals: ?1)  discuss the possibility of mother figure(s) being positive and/or negative in one's life, normalizing that some people never had positive experiences with "maternal" persons ? 2)  describe patient's specific example of mother figure(s), allowing time to vent ? 3)  identify the patient's current need for resolution in the relationship with the aforementioned person ? 4)  elicit commitments to work on resolving feelings about mother figure(s) in order to move forward in life and wellness ?  ?Summary of Patient Progress:  The patient expressed full comprehension of the concepts presented, and said she was born to her 39yo mother as an only child.  She said that her mother had always taken care of sisters and cousins, and even though she provided physically for the patient, she put emotions including the patient's on  a back burner.  She did not feel protected by her mother, as other people in the family were allowed to say whatever they wanted to her, which is part of the reason she is here.  She was able to admit to herself that she has a certain degree of resentment toward her mother even though she died 4 years ago.  She also stated her mother had a lot of health issues and would often go to the hospital; at some point, mom  stopped saying that she would see the patient when she got home, so the patient as a little girl knew her mom may not survive and come home.  She stated all of this is "part of the reason I'm here." ?  ?Therapeutic Modalities:   ?Processing ?Brief Solution-Focused Therapy ? ?Lynnell Chad  ? ?   ?

## 2022-03-11 NOTE — Progress Notes (Signed)
D. Pt continues to complain of insomnia, stated, "I slept horrible last night." Pt has been visible in the milieu interacting appropriately with peers and staff, and observed attending groups this am.  Pt currently denies SI/HI and AVH  ?A. Labs and vitals monitored. Pt compliant with medications. Pt supported emotionally and encouraged to express concerns and ask questions.   ?R. Pt remains safe with 15 minute checks. Will continue POC. ? ?  ?

## 2022-03-12 DIAGNOSIS — F332 Major depressive disorder, recurrent severe without psychotic features: Secondary | ICD-10-CM | POA: Diagnosis not present

## 2022-03-12 MED ORDER — MIRTAZAPINE 7.5 MG PO TABS
7.5000 mg | ORAL_TABLET | Freq: Every day | ORAL | Status: DC
Start: 2022-03-12 — End: 2022-03-13
  Administered 2022-03-12: 7.5 mg via ORAL
  Filled 2022-03-12 (×2): qty 1
  Filled 2022-03-12: qty 7

## 2022-03-12 NOTE — Group Note (Signed)
Date:  03/12/2022 ?Time:  11:32 AM ? ?Group Topic/Focus:  ?Orientation:   The focus of this group is to educate the patient on the purpose and policies of crisis stabilization and provide a format to answer questions about their admission.  The group details unit policies and expectations of patients while admitted. ? ? ? ?Participation Level:  Active ? ?Participation Quality:  Appropriate ? ?Affect:  Appropriate ? ?Cognitive:  Appropriate ? ?Insight: Appropriate ? ?Engagement in Group:  Engaged ? ?Modes of Intervention:  Discussion ? ?Additional Comments:   ? ?Kelvin Cellar ?03/12/2022, 11:32 AM ? ?

## 2022-03-12 NOTE — [facility] Counselor (Signed)
CSW provided patient with financial education and coaching resources to follow up with upon discharge.  ? ? ?Lavone Weisel, LCSW, LCAS ?Clincal Social Worker  ?[facility] ? ?

## 2022-03-12 NOTE — Progress Notes (Signed)
D:  Patient's self inventory sheet, patient has good sleep, sleep medication helpful.  Good appetite, normal energy level, good concentration.  Rated depression 2, denied hopeless, anxiety #3.  Denied withdrawals.  Denied physical problems.  Denied physical pain  Goal is continue to stay present.  Plans to socialize when possible .  Does have discharge plans. ?A:  Medications administered per MD orders.  Emotional support and encouragement given patient. ?R:  Denied SI and HI, contracts for safety.  Denied A/V hallucinations. ?

## 2022-03-12 NOTE — Plan of Care (Signed)
?  Problem: Education: ?Goal: Knowledge of [place] [facility] Education information/materials will improve ?Outcome: Progressing ?Goal: Emotional status will improve ?Outcome: Progressing ?Goal: Mental status will improve ?Outcome: Progressing ?Goal: Verbalization of understanding the information provided will improve ?Outcome: Progressing ?  ?Problem: Activity: ?Goal: Sleeping patterns will improve ?Outcome: Progressing ?  ?Problem: Safety: ?Goal: Periods of time without injury will increase ?Outcome: Progressing ?  ?Problem: Coping: ?Goal: Coping ability will improve ?Outcome: Progressing ?Goal: Will verbalize feelings ?Outcome: Progressing ?  ?Problem: Role Relationship: ?Goal: Will demonstrate positive changes in social behaviors and relationships ?Outcome: Progressing ?  ?

## 2022-03-12 NOTE — Progress Notes (Addendum)
[facility] Progress Note ? ?03/12/2022 9:13 AM ?Jamie Stevens  ?MRN:  [id number] ? ?Chief Complaint: SI ? ?Reason for Admission:  ?Jamie Stevens is a 26 y.o. female with a history of MDD, recurrent severe, without psychosis and ADHD who [facility] and worsening depression.The patient is currently on Hospital Day 5. ? ?Chart Review from last 24 hours:  ?The patient's chart was reviewed and nursing notes were reviewed. The patient's case was discussed in multidisciplinary team meeting. Per nurses, she denied SI or HI and had no acute behavioral issues or safety concerns noted. She attended groups. Per MAR she was compliant with scheduled medication and did not require PRNs other than Hydroxyzine for sleep. ? ?Information obtained today:  ?Subjective:  The patient was seen today and interviewed on the unit. Patient reports that the group sessions have been helping her process through her feelings and they have provided an outlet for reflections and conversations that she had previously not had the chance to share in the past. Particularly, over Mother's Day weekend, she was working through unresolved resentment towards her mom not being able to "protect" her as much as a child. Patient expressed that she realizes that some things were out of her mom's control and that she misses her mom who passed away a few years ago. She also expressed that "this would have been my first Mother's Day" as she is still processing through her miscarriage from last year. She has not been in communication with her aunt as their relationship is currently tense, but she has talked some with her friends over the weekend. She feels her mood has been improving and plans to continue participating in the group sessions. Patient reports decent quality sleep for about 6 hours last night, getting up to go to the bathroom once or twice. She thinks that the Remeron started for her last night helped with her sleep, but she  does feel a bit drowsy this morning. Patient denies SI, HI, and AVH, paranoia, ideas of reference or first rank symptoms. She reports that she has had no difficulties with eating or drinking. Patient feels that she is ready to consider discharge and is anxious to get back to her job so that she will not lose her job. Patient expressed today that she would potentially be open to PHP option if she would be able to get written work excuses to regularly attend PHP. ? ?Principal Problem: MDD (major depressive disorder), recurrent severe, without psychosis (HCC) ?Diagnosis: Principal Problem: ?  MDD (major depressive disorder), recurrent severe, without psychosis (HCC) ?Active Problems: ?  Suicidal ideation ?  Anemia ?  Cannabis abuse ? ? ?Total Time spent with patient: I personally spent 30 minutes on the unit in direct patient care. The direct patient care time included face-to-face time with the patient, reviewing the patient's chart, communicating with other professionals, and coordinating care. Greater than 50% of this time was spent in counseling or coordinating care with the patient regarding goals of hospitalization, psycho-education, and discharge planning needs. ? ?Past Psychiatric History: see H&P ? ?Past Medical History:  ?Past Medical History:  ?Diagnosis Date  ? Asthma   ? Depression   ? GERD (gastroesophageal reflux disease)   ? Obesity   ? Panic anxiety syndrome   ? Seasonal allergies   ? Vision abnormalities   ? Pt wears glasses  ?  ?Past Surgical History:  ?Procedure Laterality Date  ? ADENOIDECTOMY    ? TONSILLECTOMY    ? ?  Family History:  ?Family History  ?Problem Relation Age of Onset  ? Vision loss Mother   ? Hyperlipidemia Mother   ? Diabetes Mother   ? Cancer Mother   ? Heart disease Father   ? Cancer Father   ? ?Family Psychiatric  History: See H&P ? ?Social History:  ?Social History  ? ?Substance and Sexual Activity  ?Alcohol Use No  ?   ?Social History  ? ?Substance and Sexual Activity  ?Drug  Use Yes  ? Types: Marijuana  ?  ?Social History  ? ?Socioeconomic History  ? Marital status: Single  ?  Spouse name: Not on file  ? Number of children: Not on file  ? Years of education: Not on file  ? Highest education level: Some college, no degree  ?Occupational History  ? Not on file  ?Tobacco Use  ? Smoking status: Some Days  ?  Passive exposure: Yes  ? Smokeless tobacco: Never  ?Vaping Use  ? Vaping Use: Never used  ?Substance and Sexual Activity  ? Alcohol use: No  ? Drug use: Yes  ?  Types: Marijuana  ? Sexual activity: Not Currently  ?  Birth control/protection: None  ?Other Topics Concern  ? Not on file  ?Social History Narrative  ? Not on file  ? ?Social Determinants of Health  ? ?Financial Resource Strain: Not on file  ?Food Insecurity: Food Insecurity Present  ? Worried About Programme researcher, broadcasting/film/video in the Last Year: Sometimes true  ? Ran Out of Food in the Last Year: Never true  ?Transportation Needs: Unmet Transportation Needs  ? Lack of Transportation (Medical): No  ? Lack of Transportation (Non-Medical): Yes  ?Physical Activity: Not on file  ?Stress: Not on file  ?Social Connections: Not on file  ? ? ?Sleep: Improved ? ?Appetite:  Good ? ?Current Medications: ?Current Facility-Administered Medications  ?Medication Dose Route Frequency Provider Last Rate Last Admin  ? acetaminophen (TYLENOL) tablet 650 mg  650 mg Oral Q6H PRN Lauree Chandler, NP      ? albuterol (VENTOLIN HFA) 108 (90 Base) MCG/ACT inhaler 2 puff  2 puff Inhalation Q6H PRN Comer Locket, MD      ? alum & mag hydroxide-simeth (MAALOX/MYLANTA) 200-200-20 MG/5ML suspension 30 mL  30 mL Oral Q4H PRN Lauree Chandler, NP      ? doxycycline (VIBRA-TABS) tablet 100 mg  100 mg Oral Q12H Bartholomew Crews E, MD   100 mg at 03/12/22 6237  ? FLUoxetine (PROZAC) capsule 20 mg  20 mg Oral Daily Mason Jim, Juliya Magill E, MD   20 mg at 03/12/22 6283  ? hydrOXYzine (ATARAX) tablet 25 mg  25 mg Oral TID PRN Lauree Chandler, NP   25 mg at 03/11/22 2133   ? magnesium hydroxide (MILK OF MAGNESIA) suspension 30 mL  30 mL Oral Daily PRN Lauree Chandler, NP      ? melatonin tablet 6 mg  6 mg Oral QHS PRN Mason Jim, Yailine Ballard E, MD      ? metroNIDAZOLE (FLAGYL) tablet 500 mg  500 mg Oral Q12H Mason Jim, Kennidee Heyne E, MD   500 mg at 03/12/22 1517  ? mirtazapine (REMERON) tablet 7.5 mg  7.5 mg Oral QHS Bartholomew Crews E, MD   7.5 mg at 03/11/22 2133  ? vitamin B-12 (CYANOCOBALAMIN) tablet 1,000 mcg  1,000 mcg Oral Daily Comer Locket, MD   1,000 mcg at 03/12/22 6160  ? [START ON 03/15/2022] Vitamin D (Ergocalciferol) (DRISDOL) capsule 50,000 Units  50,000 Units Oral Q7 days Comer Locket, MD      ? ? ?Lab Results:  ?No results found for this or any previous visit (from the past 48 hour(s)). ? ?Blood Alcohol level:  ?Lab Results  ?Component Value Date  ? ETH <10 03/06/2022  ? ? ?Metabolic Disorder Labs: ?Lab Results  ?Component Value Date  ? HGBA1C 5.1 03/06/2022  ? MPG 99.67 03/06/2022  ? ?No results found for: PROLACTIN ?No results found for: CHOL, TRIG, HDL, CHOLHDL, VLDL, LDLCALC ? ?Physical Findings: ?AIMS:  ?Facial and Oral Movements ?Muscles of Facial Expression: None, normal ?Lips and Perioral Area: None, normal ?Jaw: None, normal ?Tongue: None, normal, ?Extremity Movements ?Upper (arms, wrists, hands, fingers): None, normal ?Lower (legs, knees, ankles, toes): None, normal,  ?Trunk Movements ?Neck, shoulders, hips: None, normal,  ?Overall Severity ?Severity of abnormal movements (highest score from questions above): None, normal ?Incapacitation due to abnormal movements: None, normal ?Patient's awareness of abnormal movements (rate only patient's report): No Awareness,  ?Dental Status ?Current problems with teeth and/or dentures?: No ?Does patient usually wear dentures?: No  ?  ? ?Musculoskeletal: ?Strength & Muscle Tone: within normal limits ?Gait & Station: normal ?Patient leans: N/A ? ?Psychiatric Specialty Exam: ? ?Presentation  ?General Appearance: Casually dressed,  adequate hygiene ? ? ?Eye Contact:Good ? ? ?Speech:Regular rate and fluency ? ? ?Speech Volume:Decreased ? ? ?Mood and Affect  ?Mood:Described as improving - appears less dysphoric ? ? ?Affect: More a

## 2022-03-12 NOTE — Group Note (Signed)
Recreation Therapy Group Note ? ? ?Group Topic:Stress Management  ?Group Date: 03/12/2022 ?Start Time: 64 ?End Time: 1000 ?Facilitators: Caroll Rancher, LRT,CTRS ?Location: [address] Dayroom ? ? ?Goal Area(s) Addresses:  ?Patient will actively participate in stress management techniques presented during session.  ?Patient will successfully identify benefit of practicing stress management post d/c.  ? ?Group Description: Guided Imagery. LRT provided education, instruction, and demonstration on practice of visualization via guided imagery. Patient was asked to participate in the technique introduced during session. LRT debriefed including topics of mindfulness, stress management and specific scenarios each patient could use these techniques. Patients were given suggestions of ways to access scripts post d/c and encouraged to explore Youtube and other apps available on smartphones, tablets, and computers. ? ? ?Clinical Observations/Individualized Feedback: Group did not occur due to morning group running over in to group time.  ? ? ?Plan: Continue to engage patient in RT group sessions 2-3x/week. ? ? ?Caroll Rancher, LRT,CTRS ?03/12/2022 1:50 PM ?
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
## 2022-03-12 NOTE — Group Note (Signed)
Type of Therapy and Topic: Group Therapy: Control ? ?Participation Level: Active ? ?Description of Group: ?In this group patients will discuss what is out of their control, what is somewhat in their control, and what is within their control.  They will be encouraged to explore what issues they can control and what issues are out of their control within their daily lives. They will be guided to discuss their thoughts, feelings, and behaviors related to these issues. The group will process together ways to better control things that are well within our own control and how to notice and accept the things that are not within our control. This group will be process-oriented, with patients participating in exploration of their own experiences as well as giving and receiving support and challenge from other group members.  During this group 2 worksheets will be provided to each patient to follow along and fill out.  ? ?Therapeutic Goals: ?1. Patient will identify what is within their control and what is not within their control. ?2. Patient will identify their thoughts and feelings about having control over their own lives. ?3. Patient will identify their thoughts and feelings about not having control over everything in their lives.Marland Kitchen ?4. Patient will identify ways that they can have more control over their own lives. ?5. Patient will identify areas were they can allow others to help them or provide assistance. ? ?Summary of Patient Progress: Did not attend ? ?

## 2022-03-12 NOTE — [facility] Group Notes (Signed)
Spiritual care group on grief and loss facilitated by chaplain Dyanne Carrel, [facility]  ? ?Group Goal:  ? ?Support / Education around grief and loss  ? ?Members engage in facilitated group support and psycho-social education.  ? ?Group Description:  ? ?Following introductions and group rules, group members engaged in facilitated group dialog and support around topic of loss, with particular support around experiences of loss in their lives. Group Identified types of loss (relationships / self / things) and identified patterns, circumstances, and changes that precipitate losses. Reflected on thoughts / feelings around loss, normalized grief responses, and recognized variety in grief experience. Group noted Worden's four tasks of grief in discussion.  ? ?Group drew on Adlerian / Rogerian, narrative, MI,  ? ?Patient Progress: Jamie Stevens attended group and actively participated and engaged in group.  She was able to share some of her coping skills and was respectful of peers. ? ?[employer], Bcc ?Pager, [phone] ? ?

## 2022-03-12 NOTE — Progress Notes (Signed)
Patient appears pleasant. Patient denies SI/HI/AVH. Patient complied with morning medication with no reported side effects.Pt reports sleeping better after medication.  Pt's goal for the day is to "continue to stay present". Pt reports anxiety a 3/10 and depression a 2/10. Pt has been attending groups and reports a good appetite. Patient remains safe on Q63min checks and contracts for safety.  ? ? ? ? 03/12/22 0900  ?Psych Admission Type (Psych Patients Only)  ?Admission Status Voluntary  ?Psychosocial Assessment  ?Patient Complaints Anxiety;Depression  ?Eye Contact Fair  ?Facial Expression Flat  ?Affect Appropriate to circumstance  ?Speech Logical/coherent  ?Interaction Assertive  ?Motor Activity Slow  ?Appearance/Hygiene Unremarkable  ?Behavior Characteristics Appropriate to situation  ?Mood Pleasant  ?Thought Process  ?Coherency WDL  ?Content WDL  ?Delusions None reported or observed  ?Perception WDL  ?Hallucination None reported or observed  ?Judgment Impaired  ?Confusion None  ?Danger to Self  ?Current suicidal ideation? Denies  ?Self-Injurious Behavior No self-injurious ideation or behavior indicators observed or expressed   ?Agreement Not to Harm Self Yes  ?Description of Agreement verbal  ?Danger to Others  ?Danger to Others None reported or observed  ? ? ?

## 2022-03-13 DIAGNOSIS — F332 Major depressive disorder, recurrent severe without psychotic features: Secondary | ICD-10-CM | POA: Diagnosis not present

## 2022-03-13 MED ORDER — FLUOXETINE HCL 20 MG PO CAPS: 20.0000 mg | ORAL_CAPSULE | Freq: Every day | ORAL | 0 refills | Status: DC

## 2022-03-13 MED ORDER — VITAMIN D (ERGOCALCIFEROL) 1.25 MG (50000 UNIT) PO CAPS: 50000.0000 [IU] | ORAL_CAPSULE | ORAL | 0 refills | Status: DC

## 2022-03-13 MED ORDER — STRESS FORMULA/ZINC PO TABS: 1.0000 | ORAL_TABLET | Freq: Every day | ORAL | 0 refills | Status: DC

## 2022-03-13 MED ORDER — MIRTAZAPINE 7.5 MG PO TABS: 7.5000 mg | ORAL_TABLET | Freq: Every day | ORAL | 0 refills | Status: DC

## 2022-03-13 MED ORDER — HYDROXYZINE HCL 25 MG PO TABS: 25.0000 mg | ORAL_TABLET | Freq: Three times a day (TID) | ORAL | 0 refills | Status: DC | PRN

## 2022-03-13 NOTE — [facility] Group Notes (Signed)
Pt attended AA meeting.  

## 2022-03-13 NOTE — Progress Notes (Signed)
Patient appears depressed. Patient denies SI/HI/AVH. Pt expresses excitement related to possible discharge. Pt reports depression a 2/10 and anxiety is a 1/10. Pt's goal for the day is to "make plans for discharge". Patient complied with morning medication with no reported side effects. Patient remains safe on Q75min checks and contracts for safety.  ? ? ? ? 03/13/22 0823  ?Psych Admission Type (Psych Patients Only)  ?Admission Status Voluntary  ?Psychosocial Assessment  ?Patient Complaints Anxiety;Depression  ?Eye Contact Fair  ?Facial Expression Anxious;Flat  ?Affect Appropriate to circumstance  ?Speech Logical/coherent  ?Interaction Assertive  ?Motor Activity Other (Comment) ?(WDL)  ?Appearance/Hygiene Unremarkable  ?Behavior Characteristics Appropriate to situation  ?Mood Pleasant  ?Thought Process  ?Coherency WDL  ?Content WDL  ?Delusions None reported or observed  ?Perception WDL  ?Hallucination None reported or observed  ?Judgment Limited  ?Confusion None  ?Danger to Self  ?Current suicidal ideation? Denies  ?Self-Injurious Behavior No self-injurious ideation or behavior indicators observed or expressed   ?Agreement Not to Harm Self Yes  ?Description of Agreement verbal  ?Danger to Others  ?Danger to Others None reported or observed  ? ? ?

## 2022-03-13 NOTE — Group Note (Signed)
Date:  03/13/2022 ?Time:  9:45 AM ? ?Group Topic/Focus:  ?Orientation:   The focus of this group is to educate the patient on the purpose and policies of crisis stabilization and provide a format to answer questions about their admission.  The group details unit policies and expectations of patients while admitted. ? ? ? ?Participation Level:  Active ? ?Participation Quality:  Appropriate ? ?Affect:  Appropriate ? ?Cognitive:  Appropriate ? ?Insight: Appropriate ? ?Engagement in Group:  Engaged ? ?Modes of Intervention:  Discussion ? ?Additional Comments:   ? ?Jaquita Rector ?03/13/2022, 9:45 AM ? ?
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
## 2022-03-13 NOTE — Progress Notes (Signed)
?  [facility] Adult Case Management Discharge Plan : ? ?Will you be returning to the same living situation after discharge:  Yes,  Home to apartment ?At discharge, do you have transportation home?: Yes,  friend will pick patient up ?Do you have the ability to pay for your medications: Yes,  patient provided resources for affordable medications ? ?Release of information consent forms completed and in the chart;  Patient's signature needed at discharge. ? ?Patient to Follow up at: ? Follow-up Information   ? ? [facility]. Call.   ?Specialty: Hospice and Palliative Medicine ?Why: Please call personally to schedule an appointment for grief/bereavement therapy services. ?Contact information: ?[address] ?[place] [address] ?[phone] ? ?  ?  ? ? [facility] Follow up.   ?Specialty: Behavioral Health ?Why: You may go to this provider for therapy and medication management services on walk in days:  Mondays and Wednesdays;  Arrive at 7:30 am.  Services are provided on a first come, first served basis. ?Contact information: ?[address] ?[place] [address] ?[phone] ? ?  ?  ? ? [facility] Follow up.   ?Specialty: Professional Counselor ?Why: You may go to this provider for therapy and medication management services during walk in hours for new patients:  Monday through Friday, from 9:00 am to 1:00 pm.  Services are provided on a first come, first served basis. ?Contact information: ?[address] ?[place] [address] ?[phone] ? ? ?  ?  ? ? [facility]. Go on 04/11/2022.   ?Why: You have a hospital follow up appointment for primary care services on 04/11/22 at 2:30 pm.  This appointment will be held in person. ?Contact information: ?[address] ?[place] [address] ?[phone] ? ?  ?  ? ? [facility]. Call.   ?Specialty:  Behavioral Health ?Why: Please contact Milana Na to discuss scheduling an appointment for the partial hospitalization program if interested, at [phone].  This is an intensive program that meets virtually monday through friday 9am-2pm. ?Contact information: ?[address] ?[place] [address] ?[phone] ? ?  ?  ? ?  ?  ? ?  ? ? ?Next level of care provider has access to [facility] Link:yes ? ?Safety Planning and Suicide Prevention discussed: Yes,  friend, Nechama Guard ? ?  ? ?Has patient been referred to the Quitline?: N/A patient is not a smoker, patient states to this CSW that she does not smoke cigarettes, in the chart it says that she smokes some days ? ?Patient has been referred for addiction treatment: N/A ? ?Jovan Colligan E Eldar Robitaille, LCSW ?03/13/2022, 9:22 AM ?

## 2022-03-13 NOTE — BHH Suicide Risk Assessment (Signed)
[facility] Discharge Suicide Risk Assessment ? ? ?Principal Problem: MDD (major depressive disorder), recurrent severe, without psychosis (HCC) ?Discharge Diagnoses: Principal Problem: ?  MDD (major depressive disorder), recurrent severe, without psychosis (HCC) ?Active Problems: ?  Suicidal ideation ?  Anemia ?  Cannabis abuse ? ? ?Total Time spent with patient: 20 minutes ? ?Musculoskeletal: ?Strength & Muscle Tone: within normal limits ?Gait & Station: normal ?Patient leans: N/A ? ?Psychiatric Specialty Exam ? ?Presentation  ?General Appearance: Casual; Appropriate for Environment ? ?Eye Contact:Minimal ? ?Speech:Slow; Clear and Coherent ? ?Speech Volume:Decreased ? ?Handedness:Ambidextrous ? ? ?Mood and Affect  ?Mood:Depressed ? ?Duration of Depression Symptoms: Greater than two weeks ? ?Affect:Congruent; Tearful; Flat ? ? ?Thought Process  ?Thought Processes:Coherent; Goal Directed; Linear ? ?Descriptions of Associations:Intact ? ?Orientation:Full (Time, Place and Person) ? ?Thought Content:Logical ? ?History of Schizophrenia/Schizoaffective disorder:No ? ?Duration of Psychotic Symptoms:No data recorded ?Hallucinations:No data recorded ?Ideas of Reference:None ? ?Suicidal Thoughts:No data recorded ?Homicidal Thoughts:No data recorded ? ?Sensorium  ?Memory:Immediate Fair; Recent Fair ? ?Judgment:Intact ? ?Insight:Fair ? ? ?Executive Functions  ?Concentration:Fair ? ?Attention Span:Fair ? ?Recall:Fair ? ?Fund of Knowledge:Fair ? ?Language:Fair ? ? ?Psychomotor Activity  ?Psychomotor Activity:No data recorded ? ?Assets  ?Assets:Communication Skills ? ? ?Sleep  ?Sleep:No data recorded ? ?Physical Exam: ?Physical Exam ?Vitals and nursing note reviewed.  ?Constitutional:   ?   Appearance: She is obese.  ?HENT:  ?   Head: Normocephalic and atraumatic.  ?Eyes:  ?   Extraocular Movements: Extraocular movements intact.  ?Pulmonary:  ?   Effort: Pulmonary effort is normal.  ?Musculoskeletal:     ?   General: Normal range of  motion.  ?   Cervical back: Normal range of motion.  ?Neurological:  ?   General: No focal deficit present.  ?   Mental Status: She is alert and oriented to person, place, and time.  ?Psychiatric:     ?   Mood and Affect: Mood normal.     ?   Behavior: Behavior normal.     ?   Thought Content: Thought content normal.     ?   Judgment: Judgment normal.  ? ?Review of Systems  ?Constitutional:  Negative for chills and fever.  ?HENT:  Negative for hearing loss.   ?Eyes:  Negative for blurred vision.  ?Respiratory:  Negative for cough.   ?Cardiovascular:  Negative for chest pain and palpitations.  ?Gastrointestinal:  Negative for constipation, diarrhea, nausea and vomiting.  ?Genitourinary:  Negative for dysuria.  ?Musculoskeletal:  Negative for back pain and myalgias.  ?Skin:  Negative for rash.  ?Neurological:  Negative for dizziness and headaches.  ?Psychiatric/Behavioral:  Negative for hallucinations and suicidal ideas.   ?Blood pressure 115/81, pulse 88, temperature 97.9 ?F (36.6 ?C), temperature source Oral, resp. rate 20, height 5\' 7"  (1.702 m), weight 127 kg, SpO2 100 %, unknown if currently breastfeeding. Body mass index is 43.85 kg/m?. ? ?Mental Status Per Nursing Assessment::   ?On Admission:  Suicidal ideation indicated by patient, Suicide plan ? ?Demographic Factors:  ?Adolescent or young adult ? ?Loss Factors: ?Decrease in vocational status, Loss of significant relationship, Financial problems/change in socioeconomic status, and recent losses, possible unstable housing ? ?Historical Factors: ?Prior suicide attempts and Impulsivity ? ?Risk Reduction Factors:   ?Sense of responsibility to family ? ?Continued Clinical Symptoms:  ?Depression:   Comorbid alcohol abuse/dependence ? ?Cognitive Features That Contribute To Risk:  ?None   ? ?Suicide Risk:  ?Minimal: No identifiable suicidal ideation.  Patients presenting with  no risk factors but with morbid ruminations; may be classified as minimal risk based on the  severity of the depressive symptoms ? ? Follow-up Information   ? ? [facility]. Call.   ?Specialty: Hospice and Palliative Medicine ?Why: Please call personally to schedule an appointment for grief/bereavement therapy services. ?Contact information: ?[address] ?[place] [address] ?[phone] ? ?  ?  ? ? [facility] Follow up.   ?Specialty: Behavioral Health ?Why: You may go to this provider for therapy and medication management services on walk in days:  Mondays and Wednesdays;  Arrive at 7:30 am.  Services are provided on a first come, first served basis. ?Contact information: ?[address] ?[place] [address] ?[phone] ? ?  ?  ? ? [facility] Follow up.   ?Specialty: Professional Counselor ?Why: You may go to this provider for therapy and medication management services during walk in hours for new patients:  Monday through Friday, from 9:00 am to 1:00 pm.  Services are provided on a first come, first served basis. ?Contact information: ?[address] ?[place] [address] ?[phone] ? ? ?  ?  ? ? [facility]. Go on 04/11/2022.   ?Why: You have a hospital follow up appointment for primary care services on 04/11/22 at 2:30 pm.  This appointment will be held in person. ?Contact information: ?[address] ?[place] [address] ?[phone] ? ?  ?  ? ? [facility]. Call.   ?Specialty: Behavioral Health ?Why: Please contact Milana Na to discuss scheduling an appointment for the partial hospitalization program if interested, at [phone].  This is an intensive program that meets virtually monday through friday 9am-2pm. ?Contact information: ?[address] ?[place] [address] ?[phone] ? ?  ?  ? ?  ?  ? ?  ? ? ?Plan Of Care/Follow-up recommendations:  ?Activity:  ad lib ?Diet:  low sodium, low  fat ?Other:    ?Prescriptions for new medications provided for the patient to bridge to follow up appointment. The patient was informed that refills for these prescriptions are generally not provided, and patient is encouraged to attend all follow up appointments to address medication refills and adjustments.  ? ?Today's discharge was reviewed with treatment team, and the team is in agreement that the patient is ready for discharge. The patient is was of the discharge plan for today and has been given opportunity to ask questions. At time of discharge, the patient does not vocalize any acute harm to self or others, is goal directed, able to advocate for self and organizational baseline.  ? ?At discharge, the patient is instructed to:  ?Take all medications as prescribed. ?Report any adverse effects and or reactions from the medicines to her outpatient provider promptly.  ?Do not engage in alcohol and/or illegal drug use while on prescription medicines.  ?In the event of worsening symptoms, patient is instructed to call the crisis hotline, 911 and or go to the nearest ED for appropriate evaluation and treatment of symptoms.  ?Follow-up with primary care provider for further care of medical issues, concerns and or health care needs. ?* Pregnancy: Mood stabilizing agents and other medications used in psychiatry may pose risk to pregnancy. Women of childbearing age are advised to use birth control. If you are planning on becoming pregnant, please discuss with both your OBGYN and your psychiatrist prior to stopping medication and prior  to pregnancy.  ? ?Roselle LocusStephanie Leigh Kristalynn Coddington, MD ?03/13/2022, 9:51 AM ?

## 2022-03-13 NOTE — Plan of Care (Signed)
?  Problem: Education: ?Goal: Knowledge of [facility] Education information/materials will improve ?Outcome: Progressing ?Goal: Emotional status will improve ?Outcome: Progressing ?Goal: Mental status will improve ?Outcome: Progressing ?Goal: Verbalization of understanding the information provided will improve ?Outcome: Progressing ?  ?Problem: Activity: ?Goal: Interest or engagement in activities will improve ?Outcome: Progressing ?Goal: Sleeping patterns will improve ?Outcome: Progressing ?  ?Problem: Coping: ?Goal: Ability to verbalize frustrations and anger appropriately will improve ?Outcome: Progressing ?Goal: Ability to demonstrate self-control will improve ?Outcome: Progressing ?  ?Problem: Health Behavior/Discharge Planning: ?Goal: Identification of resources available to assist in meeting health care needs will improve ?Outcome: Progressing ?Goal: Compliance with treatment plan for underlying cause of condition will improve ?Outcome: Progressing ?  ?Problem: Physical Regulation: ?Goal: Ability to maintain clinical measurements within normal limits will improve ?Outcome: Progressing ?  ?Problem: Safety: ?Goal: Periods of time without injury will increase ?Outcome: Progressing ?  ?Problem: Education: ?Goal: Utilization of techniques to improve thought processes will improve ?Outcome: Progressing ?Goal: Knowledge of the prescribed therapeutic regimen will improve ?Outcome: Progressing ?  ?Problem: Activity: ?Goal: Interest or engagement in leisure activities will improve ?Outcome: Progressing ?Goal: Imbalance in normal sleep/wake cycle will improve ?Outcome: Progressing ?  ?Problem: Coping: ?Goal: Coping ability will improve ?Outcome: Progressing ?Goal: Will verbalize feelings ?Outcome: Progressing ?  ?Problem: Health Behavior/Discharge Planning: ?Goal: Ability to make decisions will improve ?Outcome: Progressing ?Goal: Compliance with therapeutic regimen will improve ?Outcome: Progressing ?   ?Problem: Role Relationship: ?Goal: Will demonstrate positive changes in social behaviors and relationships ?Outcome: Progressing ?  ?Problem: Safety: ?Goal: Ability to disclose and discuss suicidal ideas will improve ?Outcome: Progressing ?Goal: Ability to identify and utilize support systems that promote safety will improve ?Outcome: Progressing ?  ?Problem: Self-Concept: ?Goal: Will verbalize positive feelings about self ?Outcome: Progressing ?Goal: Level of anxiety will decrease ?Outcome: Progressing ?  ?Problem: Education: ?Goal: Ability to make informed decisions regarding treatment will improve ?Outcome: Progressing ?  ?Problem: Coping: ?Goal: Coping ability will improve ?Outcome: Progressing ?  ?Problem: Health Behavior/Discharge Planning: ?Goal: Identification of resources available to assist in meeting health care needs will improve ?Outcome: Progressing ?  ?Problem: Medication: ?Goal: Compliance with prescribed medication regimen will improve ?Outcome: Progressing ?  ?Problem: Self-Concept: ?Goal: Ability to disclose and discuss suicidal ideas will improve ?Outcome: Progressing ?Goal: Will verbalize positive feelings about self ?Outcome: Progressing ?  ?Problem: Education: ?Goal: Ability to state activities that reduce stress will improve ?Outcome: Progressing ?  ?Problem: Coping: ?Goal: Ability to identify and develop effective coping behavior will improve ?Outcome: Progressing ?  ?Problem: Self-Concept: ?Goal: Ability to identify factors that promote anxiety will improve ?Outcome: Progressing ?Goal: Level of anxiety will decrease ?Outcome: Progressing ?Goal: Ability to modify response to factors that promote anxiety will improve ?Outcome: Progressing ?  ?Problem: Education: ?Goal: Knowledge of disease or condition will improve ?Outcome: Progressing ?Goal: Understanding of discharge needs will improve ?Outcome: Progressing ?  ?Problem: Health Behavior/Discharge Planning: ?Goal: Ability to identify  changes in lifestyle to reduce recurrence of condition will improve ?Outcome: Progressing ?Goal: Identification of resources available to assist in meeting health care needs will improve ?Outcome: Progressing ?  ?Problem: Physical Regulation: ?Goal: Complications related to the disease process, condition or treatment will be avoided or minimized ?Outcome: Progressing ?  ?Problem: Safety: ?Goal: Ability to remain free from injury will improve ?Outcome: Progressing ?  ?

## 2022-03-13 NOTE — Progress Notes (Addendum)
Pt discharge education was provided, and pt was accepting of information. Pt was satisfied all belongings had been returned. Pt suicide safety plan was provided to patient. Pt was discharged to lobby with a friend. RN reviewed discharge paperwork. ?

## 2022-03-13 NOTE — Plan of Care (Signed)
?Problem: Education: ?Goal: Knowledge of [facility] Education information/materials will improve ?03/13/2022 0914 by Johann Capers, RN ?Outcome: Adequate for Discharge ?03/13/2022 0823 by Johann Capers, RN ?Outcome: Progressing ?Goal: Emotional status will improve ?03/13/2022 0914 by Johann Capers, RN ?Outcome: Adequate for Discharge ?03/13/2022 0823 by Johann Capers, RN ?Outcome: Progressing ?Goal: Mental status will improve ?03/13/2022 0914 by Johann Capers, RN ?Outcome: Adequate for Discharge ?03/13/2022 0823 by Johann Capers, RN ?Outcome: Progressing ?Goal: Verbalization of understanding the information provided will improve ?03/13/2022 0914 by Johann Capers, RN ?Outcome: Adequate for Discharge ?03/13/2022 0823 by Johann Capers, RN ?Outcome: Progressing ?  ?Problem: Activity: ?Goal: Interest or engagement in activities will improve ?03/13/2022 0914 by Johann Capers, RN ?Outcome: Adequate for Discharge ?03/13/2022 0823 by Johann Capers, RN ?Outcome: Progressing ?Goal: Sleeping patterns will improve ?03/13/2022 0914 by Johann Capers, RN ?Outcome: Adequate for Discharge ?03/13/2022 0823 by Johann Capers, RN ?Outcome: Progressing ?  ?Problem: Coping: ?Goal: Ability to verbalize frustrations and anger appropriately will improve ?03/13/2022 0914 by Johann Capers, RN ?Outcome: Adequate for Discharge ?03/13/2022 0823 by Johann Capers, RN ?Outcome: Progressing ?Goal: Ability to demonstrate self-control will improve ?03/13/2022 0914 by Johann Capers, RN ?Outcome: Adequate for Discharge ?03/13/2022 0823 by Johann Capers, RN ?Outcome: Progressing ?  ?Problem: Health Behavior/Discharge Planning: ?Goal: Identification of resources available to assist in meeting health care needs will improve ?03/13/2022 0914 by Johann Capers, RN ?Outcome: Adequate for Discharge ?03/13/2022 0823 by Johann Capers, RN ?Outcome: Progressing ?Goal: Compliance with treatment plan for  underlying cause of condition will improve ?03/13/2022 0914 by Johann Capers, RN ?Outcome: Adequate for Discharge ?03/13/2022 0823 by Johann Capers, RN ?Outcome: Progressing ?  ?Problem: Physical Regulation: ?Goal: Ability to maintain clinical measurements within normal limits will improve ?03/13/2022 0914 by Johann Capers, RN ?Outcome: Adequate for Discharge ?03/13/2022 0823 by Johann Capers, RN ?Outcome: Progressing ?  ?Problem: Safety: ?Goal: Periods of time without injury will increase ?03/13/2022 0914 by Johann Capers, RN ?Outcome: Adequate for Discharge ?03/13/2022 0823 by Johann Capers, RN ?Outcome: Progressing ?  ?Problem: Education: ?Goal: Utilization of techniques to improve thought processes will improve ?03/13/2022 0914 by Johann Capers, RN ?Outcome: Adequate for Discharge ?03/13/2022 0823 by Johann Capers, RN ?Outcome: Progressing ?Goal: Knowledge of the prescribed therapeutic regimen will improve ?03/13/2022 0914 by Johann Capers, RN ?Outcome: Adequate for Discharge ?03/13/2022 0823 by Johann Capers, RN ?Outcome: Progressing ?  ?Problem: Activity: ?Goal: Interest or engagement in leisure activities will improve ?03/13/2022 0914 by Johann Capers, RN ?Outcome: Adequate for Discharge ?03/13/2022 0823 by Johann Capers, RN ?Outcome: Progressing ?Goal: Imbalance in normal sleep/wake cycle will improve ?03/13/2022 0914 by Johann Capers, RN ?Outcome: Adequate for Discharge ?03/13/2022 0823 by Johann Capers, RN ?Outcome: Progressing ?  ?Problem: Coping: ?Goal: Coping ability will improve ?03/13/2022 0914 by Johann Capers, RN ?Outcome: Adequate for Discharge ?03/13/2022 0823 by Johann Capers, RN ?Outcome: Progressing ?Goal: Will verbalize feelings ?03/13/2022 0914 by Johann Capers, RN ?Outcome: Adequate for Discharge ?03/13/2022 0823 by Johann Capers, RN ?Outcome: Progressing ?  ?Problem: Health Behavior/Discharge Planning: ?Goal: Ability to make decisions  will improve ?03/13/2022 0914 by Johann Capers, RN ?Outcome: Adequate for Discharge ?03/13/2022 0823 by Johann Capers, RN ?Outcome: Progressing ?Goal: Compliance with therapeutic regimen will improve ?03/13/2022 0914 by Johann Capers, RN ?Outcome: Adequate for Discharge ?03/13/2022  0823 by Johann Capers, RN ?Outcome: Progressing ?  ?Problem: Role Relationship: ?Goal: Will demonstrate positive changes in social behaviors and relationships ?03/13/2022 0914 by Johann Capers, RN ?Outcome: Adequate for Discharge ?03/13/2022 0823 by Johann Capers, RN ?Outcome: Progressing ?  ?Problem: Safety: ?Goal: Ability to disclose and discuss suicidal ideas will improve ?03/13/2022 0914 by Johann Capers, RN ?Outcome: Adequate for Discharge ?03/13/2022 0823 by Johann Capers, RN ?Outcome: Progressing ?Goal: Ability to identify and utilize support systems that promote safety will improve ?03/13/2022 0914 by Johann Capers, RN ?Outcome: Adequate for Discharge ?03/13/2022 0823 by Johann Capers, RN ?Outcome: Progressing ?  ?Problem: Self-Concept: ?Goal: Will verbalize positive feelings about self ?03/13/2022 0914 by Johann Capers, RN ?Outcome: Adequate for Discharge ?03/13/2022 0823 by Johann Capers, RN ?Outcome: Progressing ?Goal: Level of anxiety will decrease ?03/13/2022 0914 by Johann Capers, RN ?Outcome: Adequate for Discharge ?03/13/2022 0823 by Johann Capers, RN ?Outcome: Progressing ?  ?Problem: Education: ?Goal: Ability to make informed decisions regarding treatment will improve ?03/13/2022 0914 by Johann Capers, RN ?Outcome: Adequate for Discharge ?03/13/2022 0823 by Johann Capers, RN ?Outcome: Progressing ?  ?Problem: Coping: ?Goal: Coping ability will improve ?03/13/2022 0914 by Johann Capers, RN ?Outcome: Adequate for Discharge ?03/13/2022 0823 by Johann Capers, RN ?Outcome: Progressing ?  ?Problem: Health Behavior/Discharge Planning: ?Goal: Identification of resources  available to assist in meeting health care needs will improve ?03/13/2022 0914 by Johann Capers, RN ?Outcome: Adequate for Discharge ?03/13/2022 0823 by Johann Capers, RN ?Outcome: Progressing ?  ?Problem: Medication: ?Goal: Compliance with prescribed medication regimen will improve ?03/13/2022 0914 by Johann Capers, RN ?Outcome: Adequate for Discharge ?03/13/2022 0823 by Johann Capers, RN ?Outcome: Progressing ?  ?Problem: Self-Concept: ?Goal: Ability to disclose and discuss suicidal ideas will improve ?03/13/2022 0914 by Johann Capers, RN ?Outcome: Adequate for Discharge ?03/13/2022 0823 by Johann Capers, RN ?Outcome: Progressing ?Goal: Will verbalize positive feelings about self ?03/13/2022 0914 by Johann Capers, RN ?Outcome: Adequate for Discharge ?03/13/2022 0823 by Johann Capers, RN ?Outcome: Progressing ?  ?Problem: Education: ?Goal: Ability to state activities that reduce stress will improve ?03/13/2022 0914 by Johann Capers, RN ?Outcome: Adequate for Discharge ?03/13/2022 0823 by Johann Capers, RN ?Outcome: Progressing ?  ?Problem: Coping: ?Goal: Ability to identify and develop effective coping behavior will improve ?03/13/2022 0914 by Johann Capers, RN ?Outcome: Adequate for Discharge ?03/13/2022 0823 by Johann Capers, RN ?Outcome: Progressing ?  ?Problem: Self-Concept: ?Goal: Ability to identify factors that promote anxiety will improve ?03/13/2022 0914 by Johann Capers, RN ?Outcome: Adequate for Discharge ?03/13/2022 0823 by Johann Capers, RN ?Outcome: Progressing ?Goal: Level of anxiety will decrease ?03/13/2022 0914 by Johann Capers, RN ?Outcome: Adequate for Discharge ?03/13/2022 0823 by Johann Capers, RN ?Outcome: Progressing ?Goal: Ability to modify response to factors that promote anxiety will improve ?03/13/2022 0914 by Johann Capers, RN ?Outcome: Adequate for Discharge ?03/13/2022 0823 by Johann Capers, RN ?Outcome: Progressing ?  ?Problem:  Education: ?Goal: Knowledge of disease or condition will improve ?03/13/2022 0914 by Johann Capers, RN ?Outcome: Adequate for Discharge ?03/13/2022 0823 by Johann Capers, RN ?Outcome: Progressing ?Goal: U

## 2022-03-19 ENCOUNTER — Telehealth (HOSPITAL_COMMUNITY): Payer: Self-pay | Admitting: Licensed Clinical Social Worker

## 2022-03-21 ENCOUNTER — Telehealth (HOSPITAL_COMMUNITY): Payer: Self-pay | Admitting: Licensed Clinical Social Worker

## 2022-03-22 ENCOUNTER — Telehealth (HOSPITAL_COMMUNITY): Payer: Self-pay | Admitting: Licensed Clinical Social Worker

## 2022-03-23 ENCOUNTER — Ambulatory Visit (HOSPITAL_COMMUNITY): Payer: No Payment, Other | Admitting: Professional

## 2022-03-23 DIAGNOSIS — F332 Major depressive disorder, recurrent severe without psychotic features: Secondary | ICD-10-CM
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
## 2022-03-27 ENCOUNTER — Encounter (HOSPITAL_COMMUNITY): Payer: Self-pay

## 2022-03-27 ENCOUNTER — Telehealth (HOSPITAL_COMMUNITY): Payer: Self-pay

## 2022-03-27 ENCOUNTER — Ambulatory Visit (HOSPITAL_COMMUNITY): Payer: No Payment, Other

## 2022-03-27 NOTE — Progress Notes (Addendum)
Virtual Visit via Video Note  I connected with Jamie NeriNikiah A Lowdermilk on 03/27/22 at  9:00 AM EDT by a video enabled telemedicine application and verified that I am speaking with the correct person using two identifiers.  Location: Patient: Home Provider: Office   I discussed the limitations of evaluation and management by telemedicine and the availability of in person appointments. The patient expressed understanding and agreed to proceed.    I discussed the assessment and treatment plan with the patient. The patient was provided an opportunity to ask questions and all were answered. The patient agreed with the plan and demonstrated an understanding of the instructions.   The patient was advised to call back or seek an in-person evaluation if the symptoms worsen or if the condition fails to improve as anticipated.  I provided 15 minutes of non-face-to-face time during this encounter.   Oneta Rackanika N Agron Swiney, NP    Psychiatric Initial Adult Assessment   Patient Identification: Jamie Stevens MRN:  [id number] Date of Evaluation:  03/27/2022 Referral Source: Inpatient admission  Chief Complaint:    Visit Diagnosis:    ICD-10-CM   1. MDD (major depressive disorder), recurrent severe, without psychosis (HCC)  F33.2     2. Unresolved grief  F43.21       History of Present Illness:  Jamie Stevens isa 26 year old African-American female who presents after recent inpatient admission.  She reports struggling with grief and loss and worsening depression.  Denied that she had been taking medications as indicated.  States she became overwhelmed and attempted to overdose.  States this is her third or  4 times with her overdose attempt.  States she has been hospitalized 2 times prior.  She reports a family history with mental illness.  States her mother passed away in 2019, reported history with depression congestion heart failure and leukemia. Father: passed away in 2017 reported history of alcohol use and  depression.  Reports the passing of her grandfather in 2019 as well.  She reports she was initiated on Prozac and Remeron while inpatient.  She denied that she is followed by therapy or psychiatry currently.  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  Reports smoking marijuana occasionally.  And drinks socially.  Patient admitted to partial hospitalization programming 03/27/2022  Per discharge assessment note.: "The patient states that she has struggled with depression off and on since late adolescence.  She reports that she had an overdose attempt around age 26 which resulted in her being admitted to the child adolescent unit at [facility] approximately 6 years ago.  She states that during that admission, she was started on Wellbutrin which was her first antidepressant medication trial.  She continued Wellbutrin for several years until she no longer felt she needed the medication.  In 2019 she states her mother died with leukemia and it was around that time that she had recurrence of a major depressive episode that lasted approximately 6 months.  Around that time, she started working with a psychiatrist who placed her on Zoloft for depression.  She does not recall the dose but states she has been on the Zoloft for the last 3 years.  She states that at some point her primary care provider was helping provide refills until she could reestablish with psychiatry.  She has not seen her outpatient psychiatrist with [facility]  in the last 6 months."     Associated Signs/Symptoms: Depression Symptoms:  depressed mood, feelings of worthlessness/guilt, difficulty concentrating, hopelessness, suicidal thoughts with specific plan, suicidal attempt, anxiety, (Hypo) Manic Symptoms:  Distractibility, Irritable Mood, Anxiety Symptoms:  Excessive Worry, Psychotic  Symptoms:  Hallucinations: None PTSD Symptoms: NA  Past Psychiatric History:   Previous Psychotropic Medications: No   Substance Abuse History in the last 12 months:  No.  Consequences of Substance Abuse: NA  Past Medical History:  Past Medical History:  Diagnosis Date   Asthma    Depression    GERD (gastroesophageal reflux disease)    Obesity    Panic anxiety syndrome    Seasonal allergies    Vision abnormalities    Pt wears glasses    Past Surgical History:  Procedure Laterality Date   ADENOIDECTOMY     TONSILLECTOMY      Family Psychiatric History:   Family History:  Family History  Problem Relation Age of Onset   Vision loss Mother    Hyperlipidemia Mother    Diabetes Mother    Cancer Mother    Heart disease Father    Cancer Father     Social History:   Social History   Socioeconomic History   Marital status: Single    Spouse name: Not on file   Number of children: Not on file   Years of education: Not on file   Highest education level: Some college, no degree  Occupational History   Not on file  Tobacco Use   Smoking status: Some Days    Passive exposure: Yes   Smokeless tobacco: Never  Vaping Use   Vaping Use: Never used  Substance and Sexual Activity   Alcohol use: No   Drug use: Yes    Types: Marijuana   Sexual activity: Not Currently    Birth control/protection: None  Other Topics Concern   Not on file  Social History Narrative   Not on file   Social Determinants of Health   Financial Resource Strain: Not on file  Food Insecurity: Food Insecurity Present   Worried About Running Out of Food in the Last Year: Sometimes true   Ran Out of Food in the Last Year: Never true  Transportation Needs: Unmet Transportation Needs   Lack of Transportation (Medical): No   Lack of Transportation (Non-Medical): Yes  Physical Activity: Not on file  Stress: Not on file  Social Connections: Not on file    Additional Social History:    Allergies:  No Known Allergies  Metabolic Disorder Labs: Lab Results  Component Value Date   HGBA1C 5.1 03/06/2022   MPG 99.67 03/06/2022   No results found for: PROLACTIN No results found for: CHOL, TRIG, HDL, CHOLHDL, VLDL, LDLCALC Lab Results  Component Value Date   TSH 2.586 03/06/2022    Therapeutic Level Labs: No results found for: LITHIUM No results found for: CBMZ No results found for: VALPROATE  Current Medications: Current Outpatient Medications  Medication Sig Dispense Refill   albuterol (PROVENTIL HFA;VENTOLIN HFA) 108 (90 Base) MCG/ACT inhaler Inhale 1-2 puffs into the lungs every 4 (four) hours as needed for wheezing or shortness of breath. 3.7 g 0   FLUoxetine (PROZAC) 20 MG capsule Take 1 capsule (20 mg total) by mouth daily. 30 capsule 0   hydrOXYzine (ATARAX) 25 MG tablet Take 1 tablet (25 mg total) by mouth 3 (three) times daily as needed for anxiety. 30 tablet 0   mirtazapine (REMERON) 7.5 MG tablet  Take 1 tablet (7.5 mg total) by mouth at bedtime. 30 tablet 0   Multiple Vitamins-Minerals (B COMPLEX-C-E-ZINC) tablet Take 1 tablet by mouth daily. 30 tablet 0   Vitamin D, Ergocalciferol, (DRISDOL) 1.25 MG (50000 UNIT) CAPS capsule Take 1 capsule (50,000 Units total) by mouth every 7 (seven) days. 5 capsule 0   No current facility-administered medications for this visit.    Musculoskeletal: Strength & Muscle Tone: within normal limits Gait & Station: normal Patient leans: N/A  Psychiatric Specialty Exam: Review of Systems  Cardiovascular: Negative.   Endocrine: Negative.   Neurological: Negative.   Psychiatric/Behavioral:  Positive for agitation. The patient is nervous/anxious.   All other systems reviewed and are negative.  unknown if currently breastfeeding.There is no height or weight on file to calculate BMI.  General Appearance: Casual  Eye Contact:  Good  Speech:  Clear and Coherent  Volume:  Normal  Mood:  Anxious and Depressed  Affect:   Congruent  Thought Process:  Coherent  Orientation:  Full (Time, Place, and Person)  Thought Content:  Logical  Suicidal Thoughts:  No  Homicidal Thoughts:  No  Memory:  Immediate;   Good Recent;   Good  Judgement:  Good  Insight:  Good  Psychomotor Activity:  Normal  Concentration:  Concentration: Good  Recall:  Good  Fund of Knowledge:Good  Language: Good  Akathisia:  No  Handed:  Right  AIMS (if indicated):  done  Assets:  Communication Skills Desire for Improvement Resilience Social Support  ADL's:  Intact  Cognition: WNL  Sleep:  Good   Screenings: AIMS    Flowsheet Row [facility] Ad[facility] (Discharged) from [facility]  AIMS Total Score 0 0      AUDIT    Flowsheet Row [facility] Admission (Discharged) from OP [facility]  Alcohol Use Disorder Identification Test Final Score (AUDIT) 1 0      GAD-7    Flowsheet Row [facility]  Total GAD-7 Score 16      PHQ2-9    Flowsheet Row Counselor from 03/23/2022 in [facility] ED from 03/06/2022 in [facility] Office Visit from 05/05/2021 in [facility] Women  PHQ-2 Total Score 4 5 2   PHQ-9 Total Score 21 17 13       Flowsheet Row Counselor from 03/23/2022 in [facility] Admission (Discharged) [facility] ED from 03/06/2022 in [facility]  C-SSRS RISK CATEGORY High Risk High Risk High Risk       Assessment and Plan: Patient to start partial  hospitalization programming Continue medications as indicated  Collaboration of Care: Psychiatrist AEB keep follow-up appointments with [facility]  Patient/Guardian was advised Release of Information must be obtained prior to any record release in order to collaborate their care with an outside provider. Patient/Guardian was advised if they have not already done so to contact the registration department to sign all necessary forms in order for 05/06/2022 to release information regarding their care.   Consent: Patient/Guardian gives verbal consent for treatment and assignment of benefits for services provided during this  visit. Patient/Guardian expressed understanding and agreed to proceed.   Oneta Rack, NP 5/30/202311:12 AM

## 2022-03-27 NOTE — BH Assessment (Signed)
Care Management - Follow Up [facility] Discharges   Patient has been placed in an inpatient psychiatric hospital [facility]) on 03-07-22.

## 2022-03-28 ENCOUNTER — Encounter (HOSPITAL_COMMUNITY): Payer: Self-pay

## 2022-03-28 ENCOUNTER — Ambulatory Visit (INDEPENDENT_AMBULATORY_CARE_PROVIDER_SITE_OTHER): Payer: No Payment, Other | Admitting: Licensed Clinical Social Worker

## 2022-03-28 DIAGNOSIS — F332 Major depressive disorder, recurrent severe without psychotic features: Secondary | ICD-10-CM

## 2022-03-28 NOTE — Progress Notes (Signed)
Spoke with patient via Webex video call, used 2 identifiers to correctly identify patient. States that this is her first time in [facility] after an inpatient stay at [facility] for 1 week. She took an OD on her antidepressant medications and after a couple days decided to get help. Has been dealing with depression for years. Her father died in 2016/02/17 and then her mother died in 2018/02/16. She had a miscarriage last year and is trying to get through college. Lives in student housing. Worried about paying for college and finishing her degree as well as working. On scale 1-10 as 10 being worst she rates depression at 7 and anxiety at 7. Denies HI but has passive SI that comes and goes. No plan or intent just thoughts. Denies AV hallucinations. PHQ9=20. No issues or complaints.

## 2022-03-29 ENCOUNTER — Ambulatory Visit (INDEPENDENT_AMBULATORY_CARE_PROVIDER_SITE_OTHER): Payer: No Payment, Other | Admitting: Licensed Clinical Social Worker

## 2022-03-29 DIAGNOSIS — F332 Major depressive disorder, recurrent severe without psychotic features: Secondary | ICD-10-CM

## 2022-03-30 ENCOUNTER — Ambulatory Visit (INDEPENDENT_AMBULATORY_CARE_PROVIDER_SITE_OTHER): Payer: No Payment, Other | Admitting: Licensed Clinical Social Worker

## 2022-03-30 DIAGNOSIS — F332 Major depressive disorder, recurrent severe without psychotic features: Secondary | ICD-10-CM | POA: Diagnosis not present

## 2022-04-02 ENCOUNTER — Ambulatory Visit (INDEPENDENT_AMBULATORY_CARE_PROVIDER_SITE_OTHER): Payer: No Payment, Other | Admitting: Licensed Clinical Social Worker

## 2022-04-02 DIAGNOSIS — F332 Major depressive disorder, recurrent severe without psychotic features: Secondary | ICD-10-CM | POA: Diagnosis not present

## 2022-04-03 ENCOUNTER — Ambulatory Visit (INDEPENDENT_AMBULATORY_CARE_PROVIDER_SITE_OTHER): Payer: No Payment, Other | Admitting: Licensed Clinical Social Worker

## 2022-04-03 DIAGNOSIS — F332 Major depressive disorder, recurrent severe without psychotic features: Secondary | ICD-10-CM

## 2022-04-03 NOTE — Progress Notes (Signed)
Spoke with patient via Webex video call, used 2 identifiers to correctly identify patient. States that groups are going well. No issues or complaints. No side effects from medications. On scale 1-10 as 10 being worst rates depression at 3 and anxiety at 3. Denies SI/HI or AV hallucinations.

## 2022-04-04 ENCOUNTER — Ambulatory Visit (INDEPENDENT_AMBULATORY_CARE_PROVIDER_SITE_OTHER): Payer: No Payment, Other | Admitting: Licensed Clinical Social Worker

## 2022-04-04 DIAGNOSIS — F332 Major depressive disorder, recurrent severe without psychotic features: Secondary | ICD-10-CM | POA: Diagnosis not present

## 2022-04-05 ENCOUNTER — Ambulatory Visit (INDEPENDENT_AMBULATORY_CARE_PROVIDER_SITE_OTHER): Payer: No Payment, Other | Admitting: Licensed Clinical Social Worker

## 2022-04-05 DIAGNOSIS — F332 Major depressive disorder, recurrent severe without psychotic features: Secondary | ICD-10-CM

## 2022-04-05 DIAGNOSIS — F4321 Adjustment disorder with depressed mood: Secondary | ICD-10-CM

## 2022-04-05 MED ORDER — FLUOXETINE HCL 20 MG PO CAPS: 20.0000 mg | ORAL_CAPSULE | Freq: Every day | ORAL | 0 refills | Status: DC

## 2022-04-05 MED ORDER — HYDROXYZINE HCL 25 MG PO TABS: 25.0000 mg | ORAL_TABLET | Freq: Three times a day (TID) | ORAL | 0 refills | Status: DC | PRN

## 2022-04-05 MED ORDER — MIRTAZAPINE 7.5 MG PO TABS: 7.5000 mg | ORAL_TABLET | Freq: Every day | ORAL | 0 refills | Status: DC

## 2022-04-05 MED ORDER — HYDROXYZINE HCL 25 MG PO TABS
25.0000 mg | ORAL_TABLET | Freq: Three times a day (TID) | ORAL | 0 refills | Status: DC | PRN
Start: 2022-04-05 — End: 2022-10-20

## 2022-04-06 ENCOUNTER — Ambulatory Visit (INDEPENDENT_AMBULATORY_CARE_PROVIDER_SITE_OTHER): Payer: No Payment, Other | Admitting: Licensed Clinical Social Worker

## 2022-04-06 DIAGNOSIS — F332 Major depressive disorder, recurrent severe without psychotic features: Secondary | ICD-10-CM | POA: Diagnosis not present

## 2022-04-07 ENCOUNTER — Encounter (HOSPITAL_COMMUNITY): Payer: Self-pay | Admitting: Licensed Clinical Social Worker

## 2022-04-09 ENCOUNTER — Ambulatory Visit (HOSPITAL_COMMUNITY): Payer: No Payment, Other | Admitting: Licensed Clinical Social Worker

## 2022-04-09 DIAGNOSIS — F332 Major depressive disorder, recurrent severe without psychotic features: Secondary | ICD-10-CM

## 2022-04-09 NOTE — Progress Notes (Signed)
Spoke with patient via Webex video call, used 2 identifiers to correctly identify patient. States that she is having a rough day. Facing possibly homelessness, has no car to get to work, feels like the grief is settling in, her short term disability is not enough to pay all her bills and she owes $1400 in rent that needs paid by the end of the month. Discussed taking the bus to work but she states that she doesn't believe it runs when her shift ends. Also suggested she speak with her boss about alternative options for getting home after work and explaining her situation and desire to work. Groups are going OK. Has felt suicidal today with no plan or intent, fleeting thoughts. Able to contract for safety. Denies HI or AV hallucinations. On scale 1-10 as 10 being worst she rates depression at 8.5 and anxiety at 8.5. No other issues or complaints.

## 2022-04-10 ENCOUNTER — Ambulatory Visit (INDEPENDENT_AMBULATORY_CARE_PROVIDER_SITE_OTHER): Payer: No Payment, Other | Admitting: Licensed Clinical Social Worker

## 2022-04-10 DIAGNOSIS — F332 Major depressive disorder, recurrent severe without psychotic features: Secondary | ICD-10-CM

## 2022-04-10 NOTE — Progress Notes (Signed)
error 

## 2022-04-11 ENCOUNTER — Inpatient Hospital Stay: Payer: Medicaid Other | Admitting: Family Medicine

## 2022-04-11 ENCOUNTER — Ambulatory Visit (HOSPITAL_COMMUNITY): Payer: No Payment, Other | Admitting: Licensed Clinical Social Worker

## 2022-04-11 ENCOUNTER — Encounter (HOSPITAL_COMMUNITY): Payer: Self-pay | Admitting: Licensed Clinical Social Worker

## 2022-04-11 DIAGNOSIS — F332 Major depressive disorder, recurrent severe without psychotic features: Secondary | ICD-10-CM

## 2022-04-11 DIAGNOSIS — F4321 Adjustment disorder with depressed mood: Secondary | ICD-10-CM

## 2022-04-11 NOTE — Progress Notes (Signed)
Virtual Visit via Video Note  I connected with Jamie Stevens on 04/11/22 at  9:00 AM EDT by a video enabled telemedicine application and verified that I am speaking with the correct person using two identifiers.  Location: Patient: Home Provider: Office   I discussed the limitations of evaluation and management by telemedicine and the availability of in person appointments. The patient expressed understanding and agreed to proceed.    I discussed the assessment and treatment plan with the patient. The patient was provided an opportunity to ask questions and all were answered. The patient agreed with the plan and demonstrated an understanding of the instructions.   The patient was advised to call back or seek an in-person evaluation if the symptoms worsen or if the condition fails to improve as anticipated.  I provided 15 minutes of non-face-to-face time during this encounter.   Jamie Rackanika N Kemper Heupel, NP    BH MD/PA/NP OP Progress Note  04/11/2022 3:31 PM Jamie Stevens  MRN:  [id number]  Chief Complaint: Jamie Stevens reported " I had a rough weekend."  HPI:  Jamie Stevens was seen and evaluated via WebEx.  She presents flat, guarded and depressed.  Reported passive suicidal ideations denying plan or intent.  Patient did not elaborate on current stressors only stated " I does worry a lot."  She reported she has not been taking her medications as indicated.  States she currently resides with 2 other roommates.  Discussed following up with support group/family members.  Discussed following up with 988 and/or 911 if she start experiencing suicidal ideations with a plan.  She was receptive.  Encouraged patient to restart taking medication.  Support, encouragement and reassurance was provided.   Visit Diagnosis:    ICD-10-CM   1. MDD (major depressive disorder), recurrent severe, without psychosis (HCC)  F33.2     2. Unresolved grief  F43.21       Past Psychiatric History:   Past Medical History:   Past Medical History:  Diagnosis Date   Asthma    Depression    GERD (gastroesophageal reflux disease)    Obesity    Panic anxiety syndrome    Seasonal allergies    Vision abnormalities    Pt wears glasses    Past Surgical History:  Procedure Laterality Date   ADENOIDECTOMY     TONSILLECTOMY      Family Psychiatric History:   Family History:  Family History  Problem Relation Age of Onset   Depression Mother    Vision loss Mother    Hyperlipidemia Mother    Diabetes Mother    Cancer Mother    Alcohol abuse Father    Depression Father    Heart disease Father    Cancer Father     Social History:  Social History   Socioeconomic History   Marital status: Single    Spouse name: Not on file   Number of children: 0   Years of education: Not on file   Highest education level: Some college, no degree  Occupational History   Not on file  Tobacco Use   Smoking status: Some Days    Passive exposure: Yes   Smokeless tobacco: Never  Vaping Use   Vaping Use: Never used  Substance and Sexual Activity   Alcohol use: No   Drug use: Yes    Types: Marijuana   Sexual activity: Not Currently    Birth control/protection: None  Other Topics Concern   Not on file  Social History Narrative  Not on file   Social Determinants of Health   Financial Resource Strain: Not on file  Food Insecurity: Food Insecurity Present (05/05/2021)   Hunger Vital Sign    Worried About Running Out of Food in the Last Year: Sometimes true    Ran Out of Food in the Last Year: Never true  Transportation Needs: Unmet Transportation Needs (05/05/2021)   PRAPARE - Administrator, Civil Service (Medical): No    Lack of Transportation (Non-Medical): Yes  Physical Activity: Not on file  Stress: Not on file  Social Connections: Not on file    Allergies: No Known Allergies  Metabolic Disorder Labs: Lab Results  Component Value Date   HGBA1C 5.1 03/06/2022   MPG 99.67 03/06/2022    No results found for: "PROLACTIN" No results found for: "CHOL", "TRIG", "HDL", "CHOLHDL", "VLDL", "LDLCALC" Lab Results  Component Value Date   TSH 2.586 03/06/2022   TSH 1.447 08/23/2015    Therapeutic Level Labs: No results found for: "LITHIUM" No results found for: "VALPROATE" No results found for: "CBMZ"  Current Medications: Current Outpatient Medications  Medication Sig Dispense Refill   albuterol (PROVENTIL HFA;VENTOLIN HFA) 108 (90 Base) MCG/ACT inhaler Inhale 1-2 puffs into the lungs every 4 (four) hours as needed for wheezing or shortness of breath. 3.7 g 0   FLUoxetine (PROZAC) 20 MG capsule Take 1 capsule (20 mg total) by mouth daily. 30 capsule 0   hydrOXYzine (ATARAX) 25 MG tablet Take 1 tablet (25 mg total) by mouth 3 (three) times daily as needed for anxiety. 30 tablet 0   mirtazapine (REMERON) 7.5 MG tablet Take 1 tablet (7.5 mg total) by mouth at bedtime. 30 tablet 0   Multiple Vitamins-Minerals (B COMPLEX-C-E-ZINC) tablet Take 1 tablet by mouth daily. 30 tablet 0   Vitamin D, Ergocalciferol, (DRISDOL) 1.25 MG (50000 UNIT) CAPS capsule Take 1 capsule (50,000 Units total) by mouth every 7 (seven) days. 5 capsule 0   No current facility-administered medications for this visit.     Musculoskeletal: Strength & Muscle Tone: within normal limits Gait & Station: normal Patient leans: N/A  Psychiatric Specialty Exam: Review of Systems  Eyes: Negative.   Cardiovascular: Negative.   Psychiatric/Behavioral:  Positive for decreased concentration. The patient is nervous/anxious.   All other systems reviewed and are negative.   unknown if currently breastfeeding.There is no height or weight on file to calculate BMI.  General Appearance: Casual  Eye Contact:  Good  Speech:  Clear and Coherent  Volume:  Normal  Mood:  Anxious and Depressed  Affect:  Congruent  Thought Process:  Coherent  Orientation:  Full (Time, Place, and Person)  Thought Content: Logical    Suicidal Thoughts:  No  Homicidal Thoughts:  No  Memory:  Immediate;   Good Recent;   Good  Judgement:  Good  Insight:  Good  Psychomotor Activity:  Normal  Concentration:  Concentration: Good  Recall:  Good  Fund of Knowledge: Good  Language: Good  Akathisia:  No  Handed:  Right  AIMS (if indicated): done  Assets:  Communication Skills Desire for Improvement Resilience Social Support  ADL's:  Intact  Cognition: WNL  Sleep:  Fair   Screenings: AIMS    Flowsheet Row [facility] Admission (Discharged) from [facility]  AIMS Total Score 0 0      AUDIT    Flowsheet Row Admission (  Discharged[facility] Admission (Discharged) from OP [facility]  Alcohol Use Disorder Identification Test Final Score (AUDIT) 1 0      GAD-7    Flowsheet Row [facility]  Total GAD-7 Score 16      PHQ2-9    Flowsheet Row Counselor from 03/28/2022 in [facility] Counselor from 03/23/2022 in [facility] ED from 03/06/2022 in [facility] Office Visit from 05/05/2021 in [facility] for Women  PHQ-2 Total Score 5 4 5 2   PHQ-9 Total Score 20 21 17 13       Flowsheet Row Counselor from 03/28/2022 in [facility] Counselor from 03/23/2022 in [facility] Admission (Discharged) [facility]  C-SSRS RISK CATEGORY High Risk High Risk High Risk        Assessment and Plan:  Continue [facility] urgent care partial hospitalization  programming Restart Prozac 20 mg daily and take as directed   Collaboration of Care: Collaboration of Care: Medication Management AEB with follow up with [facility] providers and or [facility]  Patient/Guardian was advised Release of Information must be obtained prior to any record release in order to collaborate their care with an outside provider. Patient/Guardian was advised if they have not already done so to contact the registration department to sign all necessary forms in order for 05/07/2022 to release information regarding their care.   Consent: Patient/Guardian gives verbal consent for treatment and assignment of benefits for services provided during this visit. Patient/Guardian expressed understanding and agreed to proceed.    [facility], NP 04/11/2022, 3:31 PM

## 2022-04-12 ENCOUNTER — Ambulatory Visit (INDEPENDENT_AMBULATORY_CARE_PROVIDER_SITE_OTHER): Payer: No Payment, Other | Admitting: Licensed Clinical Social Worker

## 2022-04-12 DIAGNOSIS — F332 Major depressive disorder, recurrent severe without psychotic features: Secondary | ICD-10-CM

## 2022-04-13 ENCOUNTER — Ambulatory Visit (INDEPENDENT_AMBULATORY_CARE_PROVIDER_SITE_OTHER): Payer: No Payment, Other | Admitting: Licensed Clinical Social Worker

## 2022-04-13 DIAGNOSIS — F332 Major depressive disorder, recurrent severe without psychotic features: Secondary | ICD-10-CM | POA: Diagnosis not present

## 2022-04-16 ENCOUNTER — Ambulatory Visit (INDEPENDENT_AMBULATORY_CARE_PROVIDER_SITE_OTHER): Payer: No Payment, Other | Admitting: Professional

## 2022-04-16 DIAGNOSIS — F332 Major depressive disorder, recurrent severe without psychotic features: Secondary | ICD-10-CM | POA: Diagnosis not present

## 2022-04-17 ENCOUNTER — Ambulatory Visit (INDEPENDENT_AMBULATORY_CARE_PROVIDER_SITE_OTHER): Payer: No Payment, Other | Admitting: Professional

## 2022-04-17 DIAGNOSIS — F332 Major depressive disorder, recurrent severe without psychotic features: Secondary | ICD-10-CM

## 2022-04-18 ENCOUNTER — Encounter (HOSPITAL_COMMUNITY): Payer: Self-pay | Admitting: Licensed Clinical Social Worker

## 2022-04-18 ENCOUNTER — Ambulatory Visit (INDEPENDENT_AMBULATORY_CARE_PROVIDER_SITE_OTHER): Payer: No Payment, Other | Admitting: Professional

## 2022-04-18 ENCOUNTER — Telehealth (HOSPITAL_COMMUNITY): Payer: Self-pay | Admitting: Professional

## 2022-04-18 DIAGNOSIS — F4321 Adjustment disorder with depressed mood: Secondary | ICD-10-CM

## 2022-04-18 DIAGNOSIS — F332 Major depressive disorder, recurrent severe without psychotic features: Secondary | ICD-10-CM

## 2022-04-18 NOTE — Progress Notes (Cosign Needed Addendum)
Virtual Visit via Video Note  I connected with Jamie Stevens on 04/18/22 at 11:45 AM EDT by a video enabled telemedicine application and verified that I am speaking with the correct person using two identifiers.  Location: Patient: Home Provider: Office   I discussed the limitations of evaluation and management by telemedicine and the availability of in person appointments. The patient expressed understanding and agreed to proceed.  I discussed the assessment and treatment plan with the patient. The patient was provided an opportunity to ask questions and all were answered. The patient agreed with the plan and demonstrated an understanding of the instructions.   The patient was advised to call back or seek an in-person evaluation if the symptoms worsen or if the condition fails to improve as anticipated.  I provided 15 minutes of non-face-to-face time during this encounter.   Oneta Rack, NP   [facility]  Jamie Stevens [id number]  Admission date: 03/27/2022 Discharge date: 04/20/2022  Reason for admission: Per admission assessment note "Genean isa 26 year old African-American female who presents after recent inpatient admission.  She reports struggling with grief and loss and worsening depression.  Denied that she had been taking medications as indicated.  States she became overwhelmed and attempted to overdose.  States this is her third or  4 times with her overdose attempt.  States she has been hospitalized 2 times prior.  She reports a family history with mental illness.  States her mother passed away in 02-23-2018, reported history with depression congestion heart failure and leukemia. Father: passed away in 02-24-2016 reported history of alcohol use and depression.  Reports the passing of her grandfather in 02-23-2018 as well.  She reports she was initiated on Prozac and Remeron while inpatient.  She denied that she is  followed by therapy or psychiatry currently."   Progress in Program Toward Treatment Goals: Progressing she attended participated with minimal engagement.  Reporting passive suicidal ideations denying plan or intent.  Patient remains focused on current stressors related to financial.  States she recently started a new job however does not have any transportation or family support.  She presents flat guarded unsure if patient has been compliant with medications.  Discussed keeping all outpatient follow-up appointment next appointment scheduled for August for medication management.  Support encouragement reassurance was provided.  Progress (rationale): Keep all outpatient follow-up appointments  Collaboration of Care: Psychiatrist AEB [facility]  Patient/Guardian was advised Release of Information must be obtained prior to any record release in order to collaborate their care with an outside provider. Patient/Guardian was advised if they have not already done so to contact the registration department to sign all necessary forms in order for [place] to release information regarding their care.   Consent: Patient/Guardian gives verbal consent for treatment and assignment of benefits for services provided during this visit. Patient/Guardian expressed understanding and agreed to proceed.   [facility]-PHP THERAPIST 04/18/2022

## 2022-04-19 ENCOUNTER — Telehealth (HOSPITAL_COMMUNITY): Payer: Self-pay | Admitting: Licensed Clinical Social Worker

## 2022-04-19 ENCOUNTER — Ambulatory Visit (INDEPENDENT_AMBULATORY_CARE_PROVIDER_SITE_OTHER): Payer: No Payment, Other | Admitting: Licensed Clinical Social Worker

## 2022-04-19 DIAGNOSIS — F332 Major depressive disorder, recurrent severe without psychotic features: Secondary | ICD-10-CM

## 2022-04-19 NOTE — Progress Notes (Signed)
Spoke with patient via Webex video call, used 2 identifiers to correctly identify patient. States that groups are going OK. Has a lot of anxiety right now due to personal issues and dealing with a lot at home. Continues to worry about an income and paying her bills. Has not been taking her Remeron because it makes her too drowsy during the day. On scale 1-10 as 10 being worst she rates depression at 8 and anxiety at 9. Passive SI with no intent or plan. Denies HI or AV hallucinations. No issues or complaints.

## 2022-04-20 ENCOUNTER — Ambulatory Visit (HOSPITAL_COMMUNITY): Payer: Self-pay

## 2022-04-23 ENCOUNTER — Ambulatory Visit (HOSPITAL_COMMUNITY): Payer: Self-pay

## 2022-04-24 ENCOUNTER — Ambulatory Visit (HOSPITAL_COMMUNITY): Payer: Self-pay

## 2022-04-25 ENCOUNTER — Ambulatory Visit (HOSPITAL_COMMUNITY): Payer: Self-pay

## 2022-04-26 ENCOUNTER — Ambulatory Visit (HOSPITAL_COMMUNITY): Payer: Self-pay

## 2022-04-26 NOTE — Psych (Signed)
Virtual Visit via Video Note  I connected with Jamie Stevens on 04/17/22 at  9:00 AM EDT by a video enabled telemedicine application and verified that I am speaking with the correct person using two identifiers.  Location: Patient: Home Provider: Clinical Home Office   I discussed the limitations of evaluation and management by telemedicine and the availability of in person appointments. The patient expressed understanding and agreed to proceed.  Follow Up Instructions:    I discussed the assessment and treatment plan with the patient. The patient was provided an opportunity to ask questions and all were answered. The patient agreed with the plan and demonstrated an understanding of the instructions.   The patient was advised to call back or seek an in-person evaluation if the symptoms worsen or if the condition fails to improve as anticipated.  I provided 240 minutes of non-face-to-face time during this encounter.   [name], [facility]   [facility] BH PHP THERAPIST PROGRESS NOTE  Jamie Stevens [id number]  Session Time: 9:00-10:00  Participation Level: Active  Behavioral Response: CasualDrowsyAnxious and Depressed  Type of Therapy: Group Therapy  Treatment Goals addressed: Coping  Progress Towards Goals: Progressing  Interventions: CBT, DBT, Solution Focused, Strength-based, Supportive, and Reframing  Summary: Clinician led check-in regarding current stressors and situation, and review of patient completed daily inventory. Clinician utilized active listening and empathetic response and validated patient emotions. Clinician facilitated processing group on pertinent issues.?   Therapist Response: Jamie Stevens is a 26 y.o. female who presents with depression and anxiety symptoms. Patient arrived within time allowed. Patient rates her mood at a 7 on a scale of 1-10 with 10 being best. Pt reports she is "ok." Pt reports her appetite is "up and down" and sleep is "ok". Pt  reports she went to work for the first time yesterday. Pt able to process.?Pt engaged in discussion.?        Session Time: 10:00 am - 11:00 am   Participation Level: None  Behavioral Response: CasualAlertAnxious and Depressed   Type of Therapy: Group Therapy   Treatment Goals addressed: Coping   Progress Towards Goals: Progressing   Interventions: CBT, DBT, Solution Focused, Strength-based, Supportive, and Reframing   Therapist Response: Clinician led group on grounding techniques.   Summary: Pt did not engage and was not able to answer what grounding techniques that were discussed could benefit her.       Session Time: 11:00 am - 12:00 pm   Participation Level: Active   Behavioral Response: CasualAlertAnxious and Depressed   Type of Therapy: Group Therapy   Treatment Goals addressed: Coping   Progress Towards Goals: Progressing  Interventions: Skills training   Summary: OT group with cln, Beverely Risen continued with routines.   Therapist Response: Pt engaged in discussion.     Session Time: 12:00 pm - 1:00 pm   Participation Level: Minimal   Behavioral Response: CasualAlertAnxious and Depressed   Type of Therapy: Group Therapy   Treatment Goals addressed: Coping   Progress Towards Goals: Progressing   Interventions: CBT, DBT, Solution Focused, Strength-based, Supportive, and Reframing   Therapist Response: 12:00 - 12:50 pm: Clinician continued topic of "Positive Psychology". Group discussed the 5 activities to use to help change their lens. Pt's identified which activity they will try.  12:50 - 1:00 pm: Clinician led check-out. Clinician assessed for immediate needs, medication compliance and efficacy, and safety concerns?   Summary: 12:00 - 12:50 pm: Pt reports she will use positive journaling to  help change her lens. 12:50 - 1:00: At check-out, patient rates her mood at a 7 on a scale of 1-10 with 10 being great. Pt reports she is going to work.  Patient demonstrates some progress as evidenced by identifying a new coping skill to use. Patient denies SI/HI/self-harm at the end of group.    Suicidal/Homicidal: Nowithout intent/plan  Plan: ?Pt will continue in PHP and medication management while continuing to work on decreasing depression symptoms,?SI, and anxiety symptoms,?and increasing the ability to self manage symptoms.   Collaboration of Care: Medication Management AEB Hillery Jacks, NP  Patient/Guardian was advised Release of Information must be obtained prior to any record release in order to collaborate their care with an outside provider. Patient/Guardian was advised if they have not already done so to contact the registration department to sign all necessary forms in order for [place] to release information regarding their care.   Consent: Patient/Guardian gives verbal consent for treatment and assignment of benefits for services provided during this visit. Patient/Guardian expressed understanding and agreed to proceed.   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 MDD (major depressive disorder), recurrent severe, without psychosis (HCC)       [name], [facility] 04/17/22

## 2022-04-26 NOTE — Psych (Signed)
Virtual Visit via Video Note  I connected with Jamie Stevens on 04/16/22 at  9:00 AM EDT by a video enabled telemedicine application and verified that I am speaking with the correct person using two identifiers.  Location: Patient: Home Provider: Clinical Home Office   I discussed the limitations of evaluation and management by telemedicine and the availability of in person appointments. The patient expressed understanding and agreed to proceed.  Follow Up Instructions:    I discussed the assessment and treatment plan with the patient. The patient was provided an opportunity to ask questions and all were answered. The patient agreed with the plan and demonstrated an understanding of the instructions.   The patient was advised to call back or seek an in-person evaluation if the symptoms worsen or if the condition fails to improve as anticipated.  I provided 240 minutes of non-face-to-face time during this encounter.   [name], [facility]   [facility] BH PHP THERAPIST PROGRESS NOTE  Jamie Stevens [id number]  Session Time: 9:00-10:00  Participation Level: Active  Behavioral Response: CasualDrowsyAnxious and Depressed  Type of Therapy: Group Therapy  Treatment Goals addressed: Coping  Progress Towards Goals: Progressing  Interventions: CBT, DBT, Solution Focused, Strength-based, Supportive, and Reframing  Summary: Clinician led check-in regarding current stressors and situation, and review of patient completed daily inventory. Clinician utilized active listening and empathetic response and validated patient emotions. Clinician facilitated processing group on pertinent issues.?   Therapist Response: Jamie Stevens is a 26 y.o. female who presents with depression and anxiety symptoms. Patient arrived within time allowed. Patient rates her mood at a 7 on a scale of 1-10 with 10 being best. Pt reports she is "OK." Pt reports sleep and appetite are "hit or miss." Pt reports she had  an OK weekend. Pt reports she attended church and did make-up for photo shoot. Pt reports she continues to struggle to leave the house due to anxiety. Pt she is starting a job today and is somewhat anxious. Pt able to process.?Pt engaged in discussion.?        Session Time: 10:00 am - 11:00 am   Participation Level: None  Behavioral Response: CasualDrowsyAnxious and Depressed   Type of Therapy: Group Therapy   Treatment Goals addressed: Coping   Progress Towards Goals: Progressing   Interventions: CBT, DBT, Solution Focused, Strength-based, Supportive, and Reframing   Therapist Response: Clinician led group on how to handle stigma related to mental health and deal with anger.   Summary: Pt did not participate       Session Time: 11:00 am - 12:00 pm   Participation Level: Active   Behavioral Response: CasualDrowsyAnxious and Depressed   Type of Therapy: Group Therapy   Treatment Goals addressed: Coping   Progress Towards Goals: Progressing  Interventions: Skills training   Summary: OT group with cln, Edward Hollin on routines.   Therapist Response: Pt engaged in discussion.     Session Time: 12:00 pm - 1:00 pm   Participation Level: None   Behavioral Response: CasualDrowsyAnxious and Depressed   Type of Therapy: Group Therapy   Treatment Goals addressed: Coping   Progress Towards Goals: Progressing   Interventions: CBT, DBT, Solution Focused, Strength-based, Supportive, and Reframing   Therapist Response: 12:00 - 12:50 pm: Clinician introduced topic of "Positive Psychology". Group watched "Positive Psychology" Ted-Talk. Patients discussed how their "lens" of life affects the way they feel.  12:50 - 1:00 pm: Clinician led check-out. Clinician assessed for immediate needs, medication compliance  and efficacy, and safety concerns?   Summary: 12:00 - 12:50 pm: Pt did not engage.  12:50 - 1:00: At check-out, patient rates her mood at an 8 on a scale of 1-10 with  10 being great. Pt reports she is going to work this afternoon. Patient demonstrates some progress as evidenced by increased activity outside of home. Patient denies SI/HI/self-harm at the end of group.  Suicidal/Homicidal: Nowithout intent/plan  Plan: ?Pt will continue in PHP and medication management while continuing to work on decreasing depression symptoms,?SI, and anxiety symptoms,?and increasing the ability to self manage symptoms.   Collaboration of Care: Medication Management AEB Hillery Jacks, NP  Patient/Guardian was advised Release of Information must be obtained prior to any record release in order to collaborate their care with an outside provider. Patient/Guardian was advised if they have not already done so to contact the registration department to sign all necessary forms in order for [place] to release information regarding their care.   Consent: Patient/Guardian gives verbal consent for treatment and assignment of benefits for services provided during this visit. Patient/Guardian expressed understanding and agreed to proceed.   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 MDD (major depressive disorder), recurrent severe, without psychosis (HCC)       [name], [facility] 04/16/22

## 2022-04-26 NOTE — Psych (Signed)
Virtual Visit via Video Note  I connected with Jamie Stevens on 04/18/22 at 11:45 AM EDT by a video enabled telemedicine application and verified that I am speaking with the correct person using two identifiers.  Location: Patient: Home Provider: Clinical Home Office   I discussed the limitations of evaluation and management by telemedicine and the availability of in person appointments. The patient expressed understanding and agreed to proceed.  Follow Up Instructions:    I discussed the assessment and treatment plan with the patient. The patient was provided an opportunity to ask questions and all were answered. The patient agreed with the plan and demonstrated an understanding of the instructions.   The patient was advised to call back or seek an in-person evaluation if the symptoms worsen or if the condition fails to improve as anticipated.  I provided 240 minutes of non-face-to-face time during this encounter.   [name], [facility]   [facility] BH PHP THERAPIST PROGRESS NOTE  Jamie Stevens [id number]  Session Time: 9:00-10:00  Participation Level: Active  Behavioral Response: CasualDrowsyAnxious and Depressed  Type of Therapy: Group Therapy  Treatment Goals addressed: Coping  Progress Towards Goals: Progressing  Interventions: CBT, DBT, Solution Focused, Strength-based, Supportive, and Reframing  Summary: Clinician led check-in regarding current stressors and situation, and review of patient completed daily inventory. Clinician utilized active listening and empathetic response and validated patient emotions. Clinician facilitated processing group on pertinent issues.?   Therapist Response: CLIFFIE GINGRAS is a 26 y.o. female who presents with depression and anxiety symptoms. Patient arrived within time allowed. Patient rates her mood at a 2 on a scale of 1-10 with 10 being best. Pt reports she is "not great." Pt reports she is not getting paid from [employer] due to paperwork  not being filed. Pt reports struggles over finances. Pt able to process. ?Pt engaged in discussion.?        Session Time: 10:00 am - 11:00 am   Participation Level: None  Behavioral Response: CasualAlertAnxious and Depressed   Type of Therapy: Group Therapy   Treatment Goals addressed: Coping   Progress Towards Goals: Progressing   Interventions: CBT, DBT, Solution Focused, Strength-based, Supportive, and Reframing   Therapist Response: Clinician led group on anger.   Summary: Pt did not participate in discussion.       Session Time: 11:00 am - 12:00 pm   Participation Level: Active   Behavioral Response: CasualAlertAnxious and Depressed   Type of Therapy: Group Therapy   Treatment Goals addressed: Coping   Progress Towards Goals: Progressing  Interventions: Skills training, Supportive   Summary: Alroy Bailiff, talked with group about community.   Therapist Response: Pt participated in group and discussed what community means to self.     Session Time: 12:00 pm - 1:00 pm   Participation Level: Active   Behavioral Response: CasualAlertAnxious and Depressed   Type of Therapy: Group Therapy   Treatment Goals addressed: Coping   Progress Towards Goals: Progressing   Interventions: Skills training, Supportive   Therapist Response: 12:00 - 12:50 pm: OT group with Edward Hollin about relationships 12:50 - 1:00 pm: Clinician led check-out. Clinician assessed for immediate needs, medication compliance and efficacy, and safety concerns?   Summary: 12:00 - 12:50 pm: Pt participated in group 12:50 - 1:00: Patient demonstrates limited progress as evidence by continued mood dependence. Patient denies SI/HI/self-harm at the end of group.    Suicidal/Homicidal: Nowithout intent/plan  Plan: ?Pt will continue in PHP and medication management  while continuing to work on decreasing depression symptoms,?SI, and anxiety symptoms,?and increasing the ability to self  manage symptoms.   Collaboration of Care: Medication Management AEB Hillery Jacks, NP  Patient/Guardian was advised Release of Information must be obtained prior to any record release in order to collaborate their care with an outside provider. Patient/Guardian was advised if they have not already done so to contact the registration department to sign all necessary forms in order for [place] to release information regarding their care.   Consent: Patient/Guardian gives verbal consent for treatment and assignment of benefits for services provided during this visit. Patient/Guardian expressed understanding and agreed to proceed.   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 MDD (major depressive disorder), recurrent severe, without psychosis (HCC)   2. Unresolved grief       [name], [facility] 04/18/22

## 2022-04-27 ENCOUNTER — Ambulatory Visit (HOSPITAL_COMMUNITY): Payer: Self-pay

## 2022-04-30 ENCOUNTER — Ambulatory Visit (HOSPITAL_COMMUNITY): Payer: Self-pay

## 2022-05-02 ENCOUNTER — Ambulatory Visit (HOSPITAL_COMMUNITY): Payer: Self-pay

## 2022-06-04 ENCOUNTER — Ambulatory Visit (HOSPITAL_COMMUNITY): Payer: BLUE CROSS/BLUE SHIELD | Admitting: Psychiatry

## 2022-06-04 ENCOUNTER — Encounter (HOSPITAL_COMMUNITY): Payer: Self-pay

## 2022-06-11 ENCOUNTER — Ambulatory Visit (HOSPITAL_COMMUNITY): Payer: Self-pay | Admitting: Clinical

## 2022-07-05 DIAGNOSIS — D509 Iron deficiency anemia, unspecified: Secondary | ICD-10-CM | POA: Diagnosis not present

## 2022-07-05 DIAGNOSIS — Z124 Encounter for screening for malignant neoplasm of cervix: Secondary | ICD-10-CM | POA: Diagnosis not present

## 2022-07-27 NOTE — Psych (Signed)
Virtual Visit via Video Note  I connected with [name] on 04/02/22 at  9:00 AM EDT by a video enabled telemedicine application and verified that I am speaking with the correct person using two identifiers.  Location: Patient: patient home Provider: clinical home office   I discussed the limitations of evaluation and management by telemedicine and the availability of in person appointments. The patient expressed understanding and agreed to proceed.  I discussed the assessment and treatment plan with the patient. The patient was provided an opportunity to ask questions and all were answered. The patient agreed with the plan and demonstrated an understanding of the instructions.   The patient was advised to call back or seek an in-person evaluation if the symptoms worsen or if the condition fails to improve as anticipated.  Pt was provided 120 minutes of non-face-to-face time during this encounter.   Lorin Glass, LCSW   [facility] PHP THERAPIST PROGRESS NOTE  Jamie Stevens [id number]  Session Time: 9:00 - 10:00  Participation Level: Active  Behavioral Response: CasualAlertDepressed  Type of Therapy: Group Therapy  Treatment Goals addressed: Coping  Progress Towards Goals: Progressing  Interventions: CBT, DBT, Supportive, and Reframing  Summary: Clinician led check-in regarding current stressors and situation, and review of patient completed daily inventory. Clinician utilized active listening and empathetic response and validated patient emotions. Clinician facilitated processing group on pertinent issues.?    Summary: Jamie Stevens is a 26 y.o. female who presents with depression symptoms. Patient arrived within time allowed. Patient rates her mood at a 9 on a scale of 1-10 with 10 being best. Pt states she feels "pretty good." Pt reports she had an active weekend and getting out of the house was good for her. Pt states she struggled with social anxiety at a friend's  recital, however she was able to manage and didn't have to leave early. Pt reports continued sleep issues. Patient able to process. Patient engaged in discussion.        Session Time: 10:00 am - 11:00 am   Participation Level: Active   Behavioral Response: CasualAlertDepressed   Type of Therapy: Group Therapy   Treatment Goals addressed: Coping   Progress Towards Goals: Progressing   Interventions: CBT, DBT, Solution Focused, Strength-based, Supportive, and Reframing   Therapist Response: Cln led discussion on planning ahead as a way to mitigate anxiety. Group members shared worries about keeping up good habits when returning to "normal" life post-treatment. Group able to brainstorm ways to build habits now and how they can fit into their life after treatment.   Therapist Response:  Pt engaged in discussion and identified ways to plan ahead for anxieties.      ** Pt chose to leave group at 11 due to a conflicting appointment. Pt denies SI/HI before leaving group.       Suicidal/Homicidal: Nowithout intent/plan  Plan: Pt will continue in PHP while working to decrease depression and grief symptoms, increase ADLs, and increase ability to manage symptoms in a healthy manner.   Collaboration of Care: Medication Management AEB T Lewis  Patient/Guardian was advised Release of Information must be obtained prior to any record release in order to collaborate their care with an outside provider. Patient/Guardian was advised if they have not already done so to contact the registration department to sign all necessary forms in order for [place] to release information regarding their care.   Consent: Patient/Guardian gives verbal consent for treatment and assignment of benefits for services provided  during this visit. Patient/Guardian expressed understanding and agreed to proceed.   Diagnosis: MDD (major depressive disorder), recurrent severe, without psychosis ([place]) [F33.2]    1. MDD (major  depressive disorder), recurrent severe, without psychosis ([place])      Lorin Glass, LCSW

## 2022-07-27 NOTE — Psych (Signed)
Virtual Visit via Video Note  I connected with Jamie Stevens on 03/28/22 at  9:00 AM EDT by a video enabled telemedicine application and verified that I am speaking with the correct person using two identifiers.  Location: Patient: patient home Provider: clinical home office   I discussed the limitations of evaluation and management by telemedicine and the availability of in person appointments. The patient expressed understanding and agreed to proceed.  I discussed the assessment and treatment plan with the patient. The patient was provided an opportunity to ask questions and all were answered. The patient agreed with the plan and demonstrated an understanding of the instructions.   The patient was advised to call back or seek an in-person evaluation if the symptoms worsen or if the condition fails to improve as anticipated.  Pt was provided 240 minutes of non-face-to-face time during this encounter.   [name], LCSW   [facility] PHP THERAPIST PROGRESS NOTE  Jamie Stevens [id number]  Session Time: 9:00 - 10:00  Participation Level: Active  Behavioral Response: CasualAlertDepressed  Type of Therapy: Group Therapy  Treatment Goals addressed: Coping  Progress Towards Goals: Initial  Interventions: CBT, DBT, Supportive, and Reframing  Summary: Clinician led check-in regarding current stressors and situation, and review of patient completed daily inventory. Clinician utilized active listening and empathetic response and validated patient emotions. Clinician facilitated processing group on pertinent issues.?    Summary: Jamie Stevens is a 26 y.o. female who presents with depression symptoms. Patient arrived within time allowed. Patient rates her mood at a 4.5 on a scale of 1-10 with 10 being best. Pt states she feels "stressed out." Pt reports stress re: school, work, finances, and transportation. Pt reports she is "rationing" her medication because she does not have a ride  to the pharmacy. Pt reports sleep issues. Pt identifies passive SI off and on yesterday and denies plan or intent.  Patient able to process. Patient engaged in discussion.        Session Time: 10:00 am - 11:00 am   Participation Level: Active   Behavioral Response: CasualAlertDepressed   Type of Therapy: Group Therapy   Treatment Goals addressed: Coping   Progress Towards Goals: Progressing   Interventions: CBT, DBT, Solution Focused, Strength-based, Supportive, and Reframing   Therapist Response: Cln led processing group for pt's current struggles. Group members shared stressors and provided support and feedback. Cln brought in topics of boundaries, healthy relationships, and unhealthy thought processes to inform discussion.   Therapist Response: Pt able to process and provide support to group.            Session Time: 11:00 -12:00   Participation Level: Active   Behavioral Response: CasualAlertDepressed   Type of Therapy: Group Therapy, Spiritual Care   Treatment Goals addressed: Coping   Progress Towards Goals: Progressing   Interventions: Supportive, Education   Summary:  Laurell Josephs, Chaplain, led group.   Therapist Response: Pt participated        Session Time: 12:00 -1:00   Participation Level: Active   Behavioral Response: CasualAlertDepressed   Type of Therapy: Group Therapy, OT   Treatment Goals addressed: Coping   Progress Towards Goals: Progressing   Interventions: Supportive, Education   Summary:  OT, Kerrin Champagne, led group. 12:50 -1:00 Clinician led check-out. Clinician assessed for immediate needs, medication compliance and efficacy, and safety concerns   Therapist Response: 12:00 - 12:50: Pt participated 12:50 - 1:00 pm: At check-out, patient reports no immediate concerns. Patient demonstrates  progress as evidenced by participating in first group session. Patient denies SI/HI/self-harm thoughts at the end of  group.    Suicidal/Homicidal: Nowithout intent/plan  Plan: Pt will continue in PHP while working to decrease depression and grief symptoms, increase ADLs, and increase ability to manage symptoms in a healthy manner.   Collaboration of Care: Medication Management AEB T LEwis  Patient/Guardian was advised Release of Information must be obtained prior to any record release in order to collaborate their care with an outside provider. Patient/Guardian was advised if they have not already done so to contact the registration department to sign all necessary forms in order for [place] to release information regarding their care.   Consent: Patient/Guardian gives verbal consent for treatment and assignment of benefits for services provided during this visit. Patient/Guardian expressed understanding and agreed to proceed.   Diagnosis: MDD (major depressive disorder), recurrent severe, without psychosis ([place]) [F33.2]    1. MDD (major depressive disorder), recurrent severe, without psychosis ([place])      Lorin Glass, LCSW

## 2022-07-27 NOTE — Psych (Signed)
Virtual Visit via Video Note  I connected with [name] on 03/29/22 at  9:00 AM EDT by a video enabled telemedicine application and verified that I am speaking with the correct person using two identifiers.  Location: Patient: patient home Provider: clinical home office   I discussed the limitations of evaluation and management by telemedicine and the availability of in person appointments. The patient expressed understanding and agreed to proceed.  I discussed the assessment and treatment plan with the patient. The patient was provided an opportunity to ask questions and all were answered. The patient agreed with the plan and demonstrated an understanding of the instructions.   The patient was advised to call back or seek an in-person evaluation if the symptoms worsen or if the condition fails to improve as anticipated.  Pt was provided 240 minutes of non-face-to-face time during this encounter.   Lorin Glass, LCSW   [facility] [place] PHP THERAPIST PROGRESS NOTE  RAYLEN KEN [id number]  Session Time: 9:00 - 10:00  Participation Level: Active  Behavioral Response: CasualAlertDepressed  Type of Therapy: Group Therapy  Treatment Goals addressed: Coping  Progress Towards Goals: Initial  Interventions: CBT, DBT, Supportive, and Reframing  Summary: Clinician led check-in regarding current stressors and situation, and review of patient completed daily inventory. Clinician utilized active listening and empathetic response and validated patient emotions. Clinician facilitated processing group on pertinent issues.?    Summary: Jamie Stevens is a 26 y.o. female who presents with depression symptoms. Patient arrived within time allowed. Patient rates her mood at a 7 on a scale of 1-10 with 10 being best. Pt states she feels "not as down." Pt reports sleeping most of the day due to being tired and trying to escape her problems. Pt reports poor sleep at night due to sleeping during  the day. Pt struggles with coping skills other than sleeping. Patient able to process. Patient engaged in discussion.        Session Time: 10:00 am - 11:00 am   Participation Level: Active   Behavioral Response: CasualAlertDepressed   Type of Therapy: Group Therapy   Treatment Goals addressed: Coping   Progress Towards Goals: Progressing   Interventions: CBT, DBT, Solution Focused, Strength-based, Supportive, and Reframing   Therapist Response: Cln led discussion on the 5 stages of grief and the way loss affects [place]. Cln encouraged pt to consider grief as a journey to accepting a new future. Cln created space for pt to process grief concerns and validated pt's experiences.    Therapist Response:  Pt engaged in discussion and was able to identify areas of loss and process.            Session Time: 11:00 -12:00   Participation Level: Active   Behavioral Response: CasualAlertDepressed   Type of Therapy: Group Therapy, Occupational Therapy   Treatment Goals addressed: Coping   Progress Towards Goals: Progressing   Interventions: Supportive, Education   Summary:  Occupational Therapy group led by cln E. Hollan.   Therapist Response: Pt participated         Session Time: 12:00 -1:00   Participation Level: Active   Behavioral Response: CasualAlertDepressed   Type of Therapy: Group therapy   Treatment Goals addressed: Coping   Progress Towards Goals: Progressing   Interventions: CBT; Solution focused; Supportive; Reframing   Summary: 12:00 - 12:50: Cln led discussion on "why" and "what now" in terms of how we focus on our problems. Group members shared how focus on "why" has  impacted them and barriers to focusing on "what now." Group able to process. Cln encouraged pt's to consider what "why" accomplishes.  12:50 -1:00 Clinician led check-out. Clinician assessed for immediate needs, medication compliance and efficacy, and safety concerns.   Therapist Response:  12:00 - 12:50: Pt engaged in discussion. 12:50 - 1:00 pm: At check-out, patient reports no immediate concerns. Patient demonstrates progress as evidenced by following through with treatment. Patient denies SI/HI/self-harm thoughts at the end of group.    Suicidal/Homicidal: Nowithout intent/plan  Plan: Pt will continue in PHP while working to decrease depression and grief symptoms, increase ADLs, and increase ability to manage symptoms in a healthy manner.   Collaboration of Care: Medication Management AEB T Lewis  Patient/Guardian was advised Release of Information must be obtained prior to any record release in order to collaborate their care with an outside provider. Patient/Guardian was advised if they have not already done so to contact the registration department to sign all necessary forms in order for [place] to release information regarding their care.   Consent: Patient/Guardian gives verbal consent for treatment and assignment of benefits for services provided during this visit. Patient/Guardian expressed understanding and agreed to proceed.   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 MDD (major depressive disorder), recurrent severe, without psychosis ([facility])      [name], LCSW

## 2022-07-27 NOTE — Psych (Signed)
Virtual Visit via Video Note  I connected with [name] on 04/03/22 at  9:00 AM EDT by a video enabled telemedicine application and verified that I am speaking with the correct person using two identifiers.  Location: Patient: patient home Provider: clinical home office   I discussed the limitations of evaluation and management by telemedicine and the availability of in person appointments. The patient expressed understanding and agreed to proceed.  I discussed the assessment and treatment plan with the patient. The patient was provided an opportunity to ask questions and all were answered. The patient agreed with the plan and demonstrated an understanding of the instructions.   The patient was advised to call back or seek an in-person evaluation if the symptoms worsen or if the condition fails to improve as anticipated.  Pt was provided 240 minutes of non-face-to-face time during this encounter.   Lorin Glass, LCSW   [facility] PHP THERAPIST PROGRESS NOTE  KAISHA WACHOB [id number]  Session Time: 9:00 - 10:00  Participation Level: Active  Behavioral Response: CasualAlertDepressed  Type of Therapy: Group Therapy  Treatment Goals addressed: Coping  Progress Towards Goals: Progressing  Interventions: CBT, DBT, Supportive, and Reframing  Summary: Clinician led check-in regarding current stressors and situation, and review of patient completed daily inventory. Clinician utilized active listening and empathetic response and validated patient emotions. Clinician facilitated processing group on pertinent issues.?    Summary: BRYANNA YIM is a 26 y.o. female who presents with depression symptoms. Patient arrived within time allowed. Patient rates her mood at a 8 on a scale of 1-10 with 10 being best. Pt states she feels "alright." Pt reports she heard from her school and is moving through the process to determine her status. Pt reports working on disability application and  being disheartened by length of process. Pt states she cooked for the first time in a "long" time. Pt reports sleeping 5 hours.  Pt Patient able to process. Patient engaged in discussion.        Session Time: 10:00 am - 11:00 am   Participation Level: Active   Behavioral Response: CasualAlertDepressed   Type of Therapy: Group Therapy   Treatment Goals addressed: Coping   Progress Towards Goals: Progressing   Interventions: CBT, DBT, Solution Focused, Strength-based, Supportive, and Reframing   Therapist Response: Cln led discussion on impulsivity. Group discussed struggles with impulsivity. Cln highlighted theme of immediacy. Group built insight around the way immediacy interacts with impulsivity. Cln encouraged pt's to consider mantras and grounding statements to remind themselves there is time to think/feel/act.    Therapist Response:  Pt engaged in discussion and is able to make connections and gain insight.            Session Time: 11:00 -12:00   Participation Level: Active   Behavioral Response: CasualAlertDepressed   Type of Therapy: Group Therapy, Occupational Therapy   Treatment Goals addressed: Coping   Progress Towards Goals: Progressing   Interventions: Supportive, Education   Summary:  Occupational Therapy group led by cln E. Hollan.   Therapist Response: Pt participated         Session Time: 12:00 -1:00   Participation Level: Active   Behavioral Response: CasualAlertDepressed   Type of Therapy: Group therapy   Treatment Goals addressed: Coping   Progress Towards Goals: Progressing   Interventions: CBT; Solution focused; Supportive; Reframing   Summary: 12:00 - 12:50: Cln led discussion on healthy relationships. Group members shared issues they have experienced in past relationships  and in identifying what "healthy" looks like. Cln discussed respect, trust, and honesty as non-negotiable traits in a healthy dynamic. Group shared and problem  solved barriers to recognizing and priortizing these traits.  12:50 -1:00 Clinician led check-out. Clinician assessed for immediate needs, medication compliance and efficacy, and safety concerns.   Therapist Response: 12:00 - 12:50: Pt engaged in discussion and is able to process.  12:50 - 1:00 pm: At check-out, patient reports no immediate concerns. Patient demonstrates progress as evidenced by noting being proud of herself. Patient denies SI/HI/self-harm thoughts at the end of group.    Suicidal/Homicidal: Nowithout intent/plan  Plan: Pt will continue in PHP while working to decrease depression and grief symptoms, increase ADLs, and increase ability to manage symptoms in a healthy manner.   Collaboration of Care: Medication Management AEB T Lewis  Patient/Guardian was advised Release of Information must be obtained prior to any record release in order to collaborate their care with an outside provider. Patient/Guardian was advised if they have not already done so to contact the registration department to sign all necessary forms in order for [place] to release information regarding their care.   Consent: Patient/Guardian gives verbal consent for treatment and assignment of benefits for services provided during this visit. Patient/Guardian expressed understanding and agreed to proceed.   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 MDD (major depressive disorder), recurrent severe, without psychosis ([facility])      [name], LCSW

## 2022-07-27 NOTE — Psych (Signed)
Virtual Visit via Video Note  I connected with Jamie Stevens on 03/30/22 at  9:00 AM EDT by a video enabled telemedicine application and verified that I am speaking with the correct person using two identifiers.  Location: Patient: patient home Provider: clinical home office   I discussed the limitations of evaluation and management by telemedicine and the availability of in person appointments. The patient expressed understanding and agreed to proceed.  I discussed the assessment and treatment plan with the patient. The patient was provided an opportunity to ask questions and all were answered. The patient agreed with the plan and demonstrated an understanding of the instructions.   The patient was advised to call back or seek an in-person evaluation if the symptoms worsen or if the condition fails to improve as anticipated.  Pt was provided 240 minutes of non-face-to-face time during this encounter.   Lorin Glass, LCSW   [facility] PHP THERAPIST PROGRESS NOTE  Jamie Stevens [id number]  Session Time: 9:00 - 10:00  Participation Level: Active  Behavioral Response: CasualAlertDepressed  Type of Therapy: Group Therapy  Treatment Goals addressed: Coping  Progress Towards Goals: Progressing  Interventions: CBT, DBT, Supportive, and Reframing  Summary: Clinician led check-in regarding current stressors and situation, and review of patient completed daily inventory. Clinician utilized active listening and empathetic response and validated patient emotions. Clinician facilitated processing group on pertinent issues.?    Summary: Jamie Stevens is a 26 y.o. female who presents with depression symptoms. Patient arrived within time allowed. Patient rates her mood at a 7 on a scale of 1-10 with 10 being best. Pt states she feels "not as down." Pt reports she is trying to put "effort" in to making today better for herself. Pt reports getting out of bed and doing her hair and makeup.  Pt states yesterday was "a little rough" and she had passive SI due to increased stress and negative messages from her family. Pt Patient able to process. Patient engaged in discussion.        Session Time: 10:00 am - 11:00 am   Participation Level: Active   Behavioral Response: CasualAlertDepressed   Type of Therapy: Group Therapy   Treatment Goals addressed: Coping   Progress Towards Goals: Progressing   Interventions: CBT, DBT, Solution Focused, Strength-based, Supportive, and Reframing   Therapist Response: Cln led discussion on planning ahead as a way to mitigate anxiety. Group members shared worries about keeping up good habits when returning to "normal" life post-treatment. Group able to brainstorm ways to build habits now and how they can fit into their life after treatment.   Therapist Response:  Pt engaged in discussion and identified ways to plan ahead for anxieties.            Session Time: 11:00 -12:00   Participation Level: Active   Behavioral Response: CasualAlertDepressed   Type of Therapy: Group Therapy, Occupational Therapy   Treatment Goals addressed: Coping   Progress Towards Goals: Progressing   Interventions: Supportive, Education   Summary:  Occupational Therapy group led by cln E. Hollan.   Therapist Response: Pt participated         Session Time: 12:00 -1:00   Participation Level: Active   Behavioral Response: CasualAlertDepressed   Type of Therapy: Group therapy   Treatment Goals addressed: Coping   Progress Towards Goals: Progressing   Interventions: CBT; Solution focused; Supportive; Reframing   Summary: 12:00 - 12:50: Cln led discussion on saying "no." Group members shared struggles they  have with saying no and how it affects them. Cln utilized boundaries, communication, and self-esteem tenets to inform discussion.  12:50 -1:00 Clinician led check-out. Clinician assessed for immediate needs, medication compliance and  efficacy, and safety concerns.   Therapist Response: 12:00 - 12:50: Pt engaged in discussion and reports increased understanding of how to say "no."  12:50 - 1:00 pm: At check-out, patient reports no immediate concerns. Patient demonstrates progress as evidenced by increased effort. Patient denies SI/HI/self-harm thoughts at the end of group.    Suicidal/Homicidal: Nowithout intent/plan  Plan: Pt will continue in PHP while working to decrease depression and grief symptoms, increase ADLs, and increase ability to manage symptoms in a healthy manner.   Collaboration of Care: Medication Management AEB T Lewis  Patient/Guardian was advised Release of Information must be obtained prior to any record release in order to collaborate their care with an outside provider. Patient/Guardian was advised if they have not already done so to contact the registration department to sign all necessary forms in order for [place] to release information regarding their care.   Consent: Patient/Guardian gives verbal consent for treatment and assignment of benefits for services provided during this visit. Patient/Guardian expressed understanding and agreed to proceed.   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 MDD (major depressive disorder), recurrent severe, without psychosis ([facility])      [name], LCSW

## 2022-07-28 NOTE — Psych (Signed)
Virtual Visit via Video Note  I connected with Jamie Stevens on 04/09/22 at  9:00 AM EDT by a video enabled telemedicine application and verified that I am speaking with the correct person using two identifiers.  Location: Patient: patient home Provider: clinical home office   I discussed the limitations of evaluation and management by telemedicine and the availability of in person appointments. The patient expressed understanding and agreed to proceed.  I discussed the assessment and treatment plan with the patient. The patient was provided an opportunity to ask questions and all were answered. The patient agreed with the plan and demonstrated an understanding of the instructions.   The patient was advised to call back or seek an in-person evaluation if the symptoms worsen or if the condition fails to improve as anticipated.  Pt was provided 240 minutes of non-face-to-face time during this encounter.   Lorin Glass, LCSW   [facility] PHP THERAPIST PROGRESS NOTE  Jamie Stevens [id number]  Session Time: 9:00 - 10:00  Participation Level: Active  Behavioral Response: CasualAlertDepressed  Type of Therapy: Group Therapy  Treatment Goals addressed: Coping  Progress Towards Goals: Progressing  Interventions: CBT, DBT, Supportive, and Reframing  Summary: Clinician led check-in regarding current stressors and situation, and review of patient completed daily inventory. Clinician utilized active listening and empathetic response and validated patient emotions. Clinician facilitated processing group on pertinent issues.?    Summary: Jamie Stevens is a 26 y.o. female who presents with depression symptoms. Patient arrived within time allowed. Patient rates her mood at a 4.5 on a scale of 1-10 with 10 being best. Pt states she feels "stressed out." Pt reports she was offered a job and is unsure if she will be able to have the transportation there. Pt sates conflict with her godmother  over the weekend that lingered. Pt is tearful when discussing it. Pt identifies passive SI over the weekend and states she slept to manage.  Patient able to process. Patient engaged in discussion.        Session Time: 10:00 am - 11:00 am   Participation Level: Active   Behavioral Response: CasualAlertDepressed   Type of Therapy: Group Therapy   Treatment Goals addressed: Coping   Progress Towards Goals: Progressing   Interventions: CBT, DBT, Solution Focused, Strength-based, Supportive, and Reframing   Therapist Response: Cln led discussion on personal standards and they way in which it impacts the way we view ourselves and our abilities. Group members discussed judgment, struggles, and barriers they experience in terms of personal standards. Cln brought in topics of balance, grace, and kindness. Cln proposed the "best friend test" as a way to calibrate whether we are viewing our situation with kindness or harshness.     Therapist Response: Pt engaged in discussion and reports willingness to utilize the best friend test.          Session Time: 11:00 -12:00   Participation Level: Active   Behavioral Response: CasualAlertDepressed   Type of Therapy: Group Therapy, Occupational Therapy   Treatment Goals addressed: Coping   Progress Towards Goals: Progressing   Interventions: Supportive, Education   Summary:  Occupational Therapy group led by cln E. Hollan.   Therapist Response: Pt participated       Session Time: 12:00 -1:00   Participation Level: Active   Behavioral Response: CasualAlertDepressed   Type of Therapy: Group therapy   Treatment Goals addressed: Coping   Progress Towards Goals: Progressing   Interventions: CBT; Solution focused; Supportive; Reframing  Summary: 12:00 - 12:50: Cln introduced topic of CBT cognitive distortions. Cln discussed unhealthy thought patterns and how our thoughts shape our reality and irrational thoughts can alter our  perspective. Cln utilized handout "cognitive distortions" to discuss common examples of distorted thoughts and group members worked to identify examples in their own life.  12:50 -1:00 Clinician led check-out. Clinician assessed for immediate needs, medication compliance and efficacy, and safety concerns.   Therapist Response: 12:00 - 12:50: Pt engaged in discussion and is able to determine examples of distorted thinking in their own life.  12:50 - 1:00 pm: At check-out, patient reports no immediate concerns. Patient demonstrates progress as evidenced by effectively managing SI.  Patient denies SI/HI/self-harm thoughts at the end of group.    Suicidal/Homicidal: Nowithout intent/plan  Plan: Pt will continue in PHP while working to decrease depression and grief symptoms, increase ADLs, and increase ability to manage symptoms in a healthy manner.   Collaboration of Care: Medication Management AEB T Lewis  Patient/Guardian was advised Release of Information must be obtained prior to any record release in order to collaborate their care with an outside provider. Patient/Guardian was advised if they have not already done so to contact the registration department to sign all necessary forms in order for [place] to release information regarding their care.   Consent: Patient/Guardian gives verbal consent for treatment and assignment of benefits for services provided during this visit. Patient/Guardian expressed understanding and agreed to proceed.   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 MDD (major depressive disorder), recurrent severe, without psychosis (HCC)      Jamie Guiles, LCSW

## 2022-07-28 NOTE — Psych (Signed)
Virtual Visit via Video Note  I connected with Jamie Stevens on 04/11/22 at  9:00 AM EDT by a video enabled telemedicine application and verified that I am speaking with the correct person using two identifiers.  Location: Patient: patient home Provider: clinical home office   I discussed the limitations of evaluation and management by telemedicine and the availability of in person appointments. The patient expressed understanding and agreed to proceed.  I discussed the assessment and treatment plan with the patient. The patient was provided an opportunity to ask questions and all were answered. The patient agreed with the plan and demonstrated an understanding of the instructions.   The patient was advised to call back or seek an in-person evaluation if the symptoms worsen or if the condition fails to improve as anticipated.  Pt was provided 240 minutes of non-face-to-face time during this encounter.   Lorin Glass, LCSW   [facility] PHP THERAPIST PROGRESS NOTE  Jamie Stevens [id number]  Session Time: 9:00 - 10:00  Participation Level: Active  Behavioral Response: CasualAlertDepressed  Type of Therapy: Group Therapy  Treatment Goals addressed: Coping  Progress Towards Goals: Progressing  Interventions: CBT, DBT, Supportive, and Reframing  Summary: Clinician led check-in regarding current stressors and situation, and review of patient completed daily inventory. Clinician utilized active listening and empathetic response and validated patient emotions. Clinician facilitated processing group on pertinent issues.?    Summary: Jamie Stevens is a 26 y.o. female who presents with depression symptoms. Patient arrived within time allowed. Patient rates her mood at a 7 on a scale of 1-10 with 10 being best. Pt states she feels "okay." Pt reports attempting distractions to manage mood yesterday. Pt reports reaching out to friends. Pt states decrease in passive SI. Pt reports  sleeping problems and waking up early. Pt struggles with problem solving. Patient able to process. Patient engaged in discussion.        Session Time: 10:00 am - 11:00 am   Participation Level: Active   Behavioral Response: CasualAlertDepressed   Type of Therapy: Group Therapy   Treatment Goals addressed: Coping   Progress Towards Goals: Progressing   Interventions: CBT, DBT, Solution Focused, Strength-based, Supportive, and Reframing   Therapist Response: Cln led processing group for pt's current struggles. Group members shared stressors and provided support and feedback. Cln brought in topics of boundaries, healthy relationships, and unhealthy thought processes to inform discussion.   Therapist Response: Pt able to process and provide support to group.            Session Time: 11:00 -12:00   Participation Level: Active   Behavioral Response: CasualAlertDepressed   Type of Therapy: Group Therapy, Spiritual Care   Treatment Goals addressed: Coping   Progress Towards Goals: Progressing   Interventions: Supportive, Education   Summary:  Jamie Stevens, Chaplain, led group.   Therapist Response: Pt participated        Session Time: 12:00 -1:00   Participation Level: Active   Behavioral Response: CasualAlertDepressed   Type of Therapy: Group Therapy   Treatment Goals addressed: Coping   Progress Towards Goals: Progressing   Interventions: Supportive, Education   Summary:  Occupational Therapy group led by cln E. Hollan. 12:50 -1:00 Clinician led check-out. Clinician assessed for immediate needs, medication compliance and efficacy, and safety concerns   Therapist Response: 12:00 - 12:50: Pt participated 12:50 - 1:00 pm: At check-out, patient reports no immediate concerns. Patient demonstrates progress as evidenced by decreased SI.  Patient denies SI/HI/self-harm  thoughts at the end of group.    Suicidal/Homicidal: Nowithout intent/plan  Plan: Pt will  continue in PHP while working to decrease depression and grief symptoms, increase ADLs, and increase ability to manage symptoms in a healthy manner.   Collaboration of Care: Medication Management AEB T Lewis  Patient/Guardian was advised Release of Information must be obtained prior to any record release in order to collaborate their care with an outside provider. Patient/Guardian was advised if they have not already done so to contact the registration department to sign all necessary forms in order for [place] to release information regarding their care.   Consent: Patient/Guardian gives verbal consent for treatment and assignment of benefits for services provided during this visit. Patient/Guardian expressed understanding and agreed to proceed.   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 MDD (major depressive disorder), recurrent severe, without psychosis (HCC)   2. Unresolved grief      [name], LCSW

## 2022-07-28 NOTE — Psych (Signed)
Virtual Visit via Video Note  I connected with Jamie Stevens on 04/06/22 at  9:00 AM EDT by a video enabled telemedicine application and verified that I am speaking with the correct person using two identifiers.  Location: Patient: patient home Provider: clinical home office   I discussed the limitations of evaluation and management by telemedicine and the availability of in person appointments. The patient expressed understanding and agreed to proceed.  I discussed the assessment and treatment plan with the patient. The patient was provided an opportunity to ask questions and all were answered. The patient agreed with the plan and demonstrated an understanding of the instructions.   The patient was advised to call back or seek an in-person evaluation if the symptoms worsen or if the condition fails to improve as anticipated.  Pt was provided 240 minutes of non-face-to-face time during this encounter.   [name], LCSW   [facility] PHP THERAPIST PROGRESS NOTE  Jamie Stevens [id number]  Session Time: 9:00 - 10:00  Participation Level: Active  Behavioral Response: CasualAlertDepressed  Type of Therapy: Group Therapy  Treatment Goals addressed: Coping  Progress Towards Goals: Progressing  Interventions: CBT, DBT, Supportive, and Reframing  Summary: Clinician led check-in regarding current stressors and situation, and review of patient completed daily inventory. Clinician utilized active listening and empathetic response and validated patient emotions. Clinician facilitated processing group on pertinent issues.?    Summary: Jamie Stevens is a 26 y.o. female who presents with depression symptoms. Patient arrived within time allowed. Patient rates her mood at a 7 on a scale of 1-10 with 10 being best. Pt states she feels "okay." Pt reports she has two interviews today and is feeling anxious. Pt states she is "trying to stay level headed" and not let her anxiety overcome her.  Pt is able to discuss skills she can apply. Patient able to process. Patient engaged in discussion.        Session Time: 10:00 am - 11:00 am   Participation Level: Active   Behavioral Response: CasualAlertDepressed   Type of Therapy: Group Therapy   Treatment Goals addressed: Coping   Progress Towards Goals: Progressing   Interventions: CBT, DBT, Solution Focused, Strength-based, Supportive, and Reframing   Therapist Response: Cln led discussion on family dynamics and the way in which they impact [place]. Group members shared struggles with their families and the way the patterns of behaviors have negatively impacted them. Cln provided space to process and validated pt's experiences.     Therapist Response:  Pt engaged in discussion and is able to process.         Session Time: 11:00 -12:00   Participation Level: Active   Behavioral Response: CasualAlertDepressed   Type of Therapy: Group Therapy, Occupational Therapy   Treatment Goals addressed: Coping   Progress Towards Goals: Progressing   Interventions: Supportive, Education   Summary:  Occupational Therapy group led by cln E. Hollan.   Therapist Response: Pt participated         Session Time: 12:00 -1:00   Participation Level: Active   Behavioral Response: CasualAlertDepressed   Type of Therapy: Group therapy   Treatment Goals addressed: Coping   Progress Towards Goals: Progressing   Interventions: CBT; Solution focused; Supportive; Reframing   Summary: 12:00 - 12:50: Cln introduced DBT concept of radical acceptance. Cln discussed radical acceptance as a strategy to decrease distress and to manage situations outside of their control. Group volunteered struggles they are experiencing with control and cln helped  group apply radical acceptance.  12:50 -1:00 Clinician led check-out. Clinician assessed for immediate needs, medication compliance and efficacy, and safety concerns.   Therapist Response: 12:00 -  12:50: Pt engaged in discussion and identified ways they can apply radical acceptance in their life.  12:50 - 1:00 pm: At check-out, patient reports no immediate concerns. Patient demonstrates progress as evidenced by attempting coping skills.  Patient denies SI/HI/self-harm thoughts at the end of group.    Suicidal/Homicidal: Nowithout intent/plan  Plan: Pt will continue in PHP while working to decrease depression and grief symptoms, increase ADLs, and increase ability to manage symptoms in a healthy manner.   Collaboration of Care: Medication Management AEB T Lewis  Patient/Guardian was advised Release of Information must be obtained prior to any record release in order to collaborate their care with an outside provider. Patient/Guardian was advised if they have not already done so to contact the registration department to sign all necessary forms in order for [place] to release information regarding their care.   Consent: Patient/Guardian gives verbal consent for treatment and assignment of benefits for services provided during this visit. Patient/Guardian expressed understanding and agreed to proceed.   Diagnosis: MDD (major depressive disorder), recurrent severe, without psychosis ([place]) [F33.2]    1. MDD (major depressive disorder), recurrent severe, without psychosis ([place])      Lorin Glass, LCSW

## 2022-07-28 NOTE — Psych (Signed)
Virtual Visit via Video Note  I connected with Lavone Neri on 04/04/22 at  9:00 AM EDT by a video enabled telemedicine application and verified that I am speaking with the correct person using two identifiers.  Location: Patient: patient home Provider: clinical home office   I discussed the limitations of evaluation and management by telemedicine and the availability of in person appointments. The patient expressed understanding and agreed to proceed.  I discussed the assessment and treatment plan with the patient. The patient was provided an opportunity to ask questions and all were answered. The patient agreed with the plan and demonstrated an understanding of the instructions.   The patient was advised to call back or seek an in-person evaluation if the symptoms worsen or if the condition fails to improve as anticipated.  Pt was provided 240 minutes of non-face-to-face time during this encounter.   [name], LCSW   [facility] PHP THERAPIST PROGRESS NOTE  JAXYN MESTAS [id number]  Session Time: 9:00 - 10:00  Participation Level: Active  Behavioral Response: CasualAlertDepressed  Type of Therapy: Group Therapy  Treatment Goals addressed: Coping  Progress Towards Goals: Progressing  Interventions: CBT, DBT, Supportive, and Reframing  Summary: Clinician led check-in regarding current stressors and situation, and review of patient completed daily inventory. Clinician utilized active listening and empathetic response and validated patient emotions. Clinician facilitated processing group on pertinent issues.?    Summary: MELESSA COWELL is a 26 y.o. female who presents with depression symptoms. Patient arrived within time allowed. Patient rates her mood at a 6 on a scale of 1-10 with 10 being best. Pt states she feels "overwhelmed." Pt reports "everything hit me at once." Pt reports difficulty in registering for school, finances, and concentration. Pt reports having a job  offer but feeling unable to complete on boarding paperwork. Pt states feeling "defeated" and "mentally exhausted." Pt reports poor sleep. Patient able to process. Patient engaged in discussion.        Session Time: 10:00 am - 11:00 am   Participation Level: Active   Behavioral Response: CasualAlertDepressed   Type of Therapy: Group Therapy   Treatment Goals addressed: Coping   Progress Towards Goals: Progressing   Interventions: CBT, DBT, Solution Focused, Strength-based, Supportive, and Reframing   Therapist Response: Cln led processing group for pt's current struggles. Group members shared stressors and provided support and feedback. Cln brought in topics of boundaries, healthy relationships, and unhealthy thought processes to inform discussion.   Therapist Response: Pt able to process and provide support to group.            Session Time: 11:00 -12:00   Participation Level: Active   Behavioral Response: CasualAlertDepressed   Type of Therapy: Group Therapy, Spiritual Care   Treatment Goals addressed: Coping   Progress Towards Goals: Progressing   Interventions: Supportive, Education   Summary:  Laurell Josephs, Chaplain, led group.   Therapist Response: Pt participated        Session Time: 12:00 -1:00   Participation Level: Active   Behavioral Response: CasualAlertDepressed   Type of Therapy: Group Therapy   Treatment Goals addressed: Coping   Progress Towards Goals: Progressing   Interventions: CBT, DBT, Solution Focused, Strength-based, Supportive, and Reframing   Summary: Cln introduced topic of DBT Self-Soothe skills. Group discussed ways they can utilize the five senses to soothe themselves when struggling.  12:50 -1:00 Clinician led check-out. Clinician assessed for immediate needs, medication compliance and efficacy, and safety concerns   Therapist Response:  12:00 - 12:50: Pt engaged in discussion and is able to determine ways to utilize each of  the five senses. 12:50 - 1:00 pm: At check-out, patient reports no immediate concerns. Patient demonstrates progress as evidenced by coming to group when she didn't want to.  Patient denies SI/HI/self-harm thoughts at the end of group.    Suicidal/Homicidal: Nowithout intent/plan  Plan: Pt will continue in PHP while working to decrease depression and grief symptoms, increase ADLs, and increase ability to manage symptoms in a healthy manner.   Collaboration of Care: Medication Management AEB T Lewis  Patient/Guardian was advised Release of Information must be obtained prior to any record release in order to collaborate their care with an outside provider. Patient/Guardian was advised if they have not already done so to contact the registration department to sign all necessary forms in order for [place] to release information regarding their care.   Consent: Patient/Guardian gives verbal consent for treatment and assignment of benefits for services provided during this visit. Patient/Guardian expressed understanding and agreed to proceed.   Diagnosis: MDD (major depressive disorder), recurrent severe, without psychosis ([place]) [F33.2]    1. MDD (major depressive disorder), recurrent severe, without psychosis ([place])      Lorin Glass, LCSW

## 2022-07-28 NOTE — Psych (Signed)
Virtual Visit via Video Note  I connected with [name] on 04/10/22 at  9:00 AM EDT by a video enabled telemedicine application and verified that I am speaking with the correct person using two identifiers.  Location: Patient: patient home Provider: clinical home office   I discussed the limitations of evaluation and management by telemedicine and the availability of in person appointments. The patient expressed understanding and agreed to proceed.  I discussed the assessment and treatment plan with the patient. The patient was provided an opportunity to ask questions and all were answered. The patient agreed with the plan and demonstrated an understanding of the instructions.   The patient was advised to call back or seek an in-person evaluation if the symptoms worsen or if the condition fails to improve as anticipated.  Pt was provided 240 minutes of non-face-to-face time during this encounter.   Lorin Glass, LCSW   [facility] [place] PHP THERAPIST PROGRESS NOTE  CHERIDAN KIBLER [id number]  Session Time: 9:00 - 10:00  Participation Level: Active  Behavioral Response: CasualAlertDepressed  Type of Therapy: Group Therapy  Treatment Goals addressed: Coping  Progress Towards Goals: Progressing  Interventions: CBT, DBT, Supportive, and Reframing  Summary: Clinician led check-in regarding current stressors and situation, and review of patient completed daily inventory. Clinician utilized active listening and empathetic response and validated patient emotions. Clinician facilitated processing group on pertinent issues.?    Summary: ESTEPHANI POPPER is a 26 y.o. female who presents with depression symptoms. Patient arrived within time allowed. Patient rates her mood at a 6 on a scale of 1-10 with 10 being best. Pt states she feels "depressed." Pt reports she is in a low mood and struggling to manage it. Pt reports sleeping most of the day as an avoidant behavior. Pt reports ruminating  most f the night. Pt states increasing passive SI and denies plan or intent. Pt reports continued reliance on sleep as a coping skill. Patient able to process. Patient engaged in discussion.        Session Time: 10:00 am - 11:00 am   Participation Level: Active   Behavioral Response: CasualAlertDepressed   Type of Therapy: Group Therapy   Treatment Goals addressed: Coping   Progress Towards Goals: Progressing   Interventions: CBT, DBT, Solution Focused, Strength-based, Supportive, and Reframing   Therapist Response: Cln led discussion on unrealistic expectations and the ways expectations can alter perspective. Group members discussed feelings and situations that have occurred due to expectation and how to know if they are unrealistic. Cln brought in CBT thought challenging and reframing to aid discussion.     Therapist Response: Pt engaged in discussion and reports gaining insight.          Session Time: 11:00 -12:00   Participation Level: Active   Behavioral Response: CasualAlertDepressed   Type of Therapy: Group Therapy, Occupational Therapy   Treatment Goals addressed: Coping   Progress Towards Goals: Progressing   Interventions: Supportive, Education   Summary:  Occupational Therapy group led by cln E. Hollan.   Therapist Response: Pt participated       Session Time: 12:00 -1:00   Participation Level: Active   Behavioral Response: CasualAlertDepressed   Type of Therapy: Group therapy   Treatment Goals addressed: Coping   Progress Towards Goals: Progressing   Interventions: CBT; Solution focused; Supportive; Reframing   Summary: 12:00 - 12:50: Cln continued topic of CBT cognitive distortions and utilized handout "Unhealthy Thought Patterns "to review common examples of distorted thought to  increase awareness of the distorted thoughts. 12:50 -1:00 Clinician led check-out. Clinician assessed for immediate needs, medication compliance and efficacy, and  safety concerns.   Therapist Response: 12:00 - 12:50: Pt engaged in discussion and is able to make connections from her life to the distorted thoughts.   12:50 - 1:00 pm: At check-out, patient reports no immediate concerns. Patient demonstrates progress as evidenced by effectively managing SI.  Patient denies SI/HI/self-harm thoughts at the end of group.    Suicidal/Homicidal: Nowithout intent/plan  Plan: Pt will continue in PHP while working to decrease depression and grief symptoms, increase ADLs, and increase ability to manage symptoms in a healthy manner.   Collaboration of Care: Medication Management AEB T Lewis  Patient/Guardian was advised Release of Information must be obtained prior to any record release in order to collaborate their care with an outside provider. Patient/Guardian was advised if they have not already done so to contact the registration department to sign all necessary forms in order for [place] to release information regarding their care.   Consent: Patient/Guardian gives verbal consent for treatment and assignment of benefits for services provided during this visit. Patient/Guardian expressed understanding and agreed to proceed.   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 MDD (major depressive disorder), recurrent severe, without psychosis (HCC)      [name], LCSW

## 2022-07-28 NOTE — Psych (Signed)
Virtual Visit via Video Note  I connected with Jamie Stevens on 04/13/22 at  9:00 AM EDT by a video enabled telemedicine application and verified that I am speaking with the correct person using two identifiers.  Location: Patient: patient home Provider: clinical home office   I discussed the limitations of evaluation and management by telemedicine and the availability of in person appointments. The patient expressed understanding and agreed to proceed.  I discussed the assessment and treatment plan with the patient. The patient was provided an opportunity to ask questions and all were answered. The patient agreed with the plan and demonstrated an understanding of the instructions.   The patient was advised to call back or seek an in-person evaluation if the symptoms worsen or if the condition fails to improve as anticipated.  Pt was provided 240 minutes of non-face-to-face time during this encounter.   [name], LCSW   [facility] PHP THERAPIST PROGRESS NOTE  Jamie Stevens [id number]  Session Time: 9:00 - 10:00  Participation Level: Active  Behavioral Response: CasualAlertDepressed  Type of Therapy: Group Therapy  Treatment Goals addressed: Coping  Progress Towards Goals: Progressing  Interventions: CBT, DBT, Supportive, and Reframing  Summary: Clinician led check-in regarding current stressors and situation, and review of patient completed daily inventory. Clinician utilized active listening and empathetic response and validated patient emotions. Clinician facilitated processing group on pertinent issues.?    Summary: Jamie Stevens is a 26 y.o. female who presents with depression symptoms. Patient arrived within time allowed. Patient rates her mood at a 6 on a scale of 1-10 with 10 being best. Pt states she feels "in the middle." Pt reports she slept through the day again after becoming overwhelmed with an issue at her apartment. Pt is resisting other coping skills.  Pt reports self harm thoughts and managing with sleep.  Patient able to process. Patient engaged in discussion.        Session Time: 10:00 am - 11:00 am   Participation Level: Active   Behavioral Response: CasualAlertDepressed   Type of Therapy: Group Therapy   Treatment Goals addressed: Coping   Progress Towards Goals: Progressing   Interventions: CBT, DBT, Solution Focused, Strength-based, Supportive, and Reframing   Therapist Response:  Cln continued topic of positive self-esteem. Group members shared struggles with appearance and how that impacts their self-esteem. Cln encouraged pt's to consider what their body does for them as a way to reframe physical self-esteem.     Therapist Response: Pt engaged in discussion and shares struggles. Pt is able to state something her body does for her.           Session Time: 11:00 -12:00   Participation Level: Active   Behavioral Response: CasualAlertDepressed   Type of Therapy: Group Therapy, Occupational Therapy   Treatment Goals addressed: Coping   Progress Towards Goals: Progressing   Interventions: Supportive, Education   Summary:  Occupational Therapy group led by cln Jamie Stevens.   Therapist Response: Pt participated       Session Time: 12:00 -1:00   Participation Level: Active   Behavioral Response: CasualAlertDepressed   Type of Therapy: Group therapy   Treatment Goals addressed: Coping   Progress Towards Goals: Progressing   Interventions: CBT; Solution focused; Supportive; Reframing   Summary: 12:00 - 12:50: Cln led discussion on valuing ourselves in the same way, or more than we value others. Group viewed TED talk "The Person You Really Need to Marry" to facilitate discussion. Group discussed what  it would be like to view themselves as they do a romantic partner and treat themselves with as much care.  12:50 -1:00 Clinician led check-out. Clinician assessed for immediate needs, medication compliance and  efficacy, and safety concerns.   Therapist Response: 12:00 - 12:50:  Pt engaged in discussion and is able to share struggles with valuing themself. 12:50 - 1:00 pm: At check-out, patient reports no immediate concerns. Patient demonstrates limited progress as evidenced by resistance to utilizing other coping skills. Patient denies SI/HI/self-harm thoughts at the end of group.    Suicidal/Homicidal: Nowithout intent/plan  Plan: Pt will continue in PHP while working to decrease depression and grief symptoms, increase ADLs, and increase ability to manage symptoms in a healthy manner.   Collaboration of Care: Medication Management AEB T Lewis  Patient/Guardian was advised Release of Information must be obtained prior to any record release in order to collaborate their care with an outside provider. Patient/Guardian was advised if they have not already done so to contact the registration department to sign all necessary forms in order for [place] to release information regarding their care.   Consent: Patient/Guardian gives verbal consent for treatment and assignment of benefits for services provided during this visit. Patient/Guardian expressed understanding and agreed to proceed.   Diagnosis: MDD (major depressive disorder), recurrent severe, without psychosis ([place]) [F33.2]    1. MDD (major depressive disorder), recurrent severe, without psychosis ([place])      Lorin Glass, LCSW

## 2022-07-28 NOTE — Psych (Signed)
Virtual Visit via Video Note  I connected with Jamie Stevens on 04/05/22 at  9:00 AM EDT by a video enabled telemedicine application and verified that I am speaking with the correct person using two identifiers.  Location: Patient: patient home Provider: clinical home office   I discussed the limitations of evaluation and management by telemedicine and the availability of in person appointments. The patient expressed understanding and agreed to proceed.  I discussed the assessment and treatment plan with the patient. The patient was provided an opportunity to ask questions and all were answered. The patient agreed with the plan and demonstrated an understanding of the instructions.   The patient was advised to call back or seek an in-person evaluation if the symptoms worsen or if the condition fails to improve as anticipated.  Pt was provided 240 minutes of non-face-to-face time during this encounter.   Jamie Guiles, LCSW   [facility] PHP THERAPIST PROGRESS NOTE  Jamie Stevens [id number]  Session Time: 9:00 - 10:00  Participation Level: Active  Behavioral Response: CasualAlertDepressed  Type of Therapy: Group Therapy  Treatment Goals addressed: Coping  Progress Towards Goals: Progressing  Interventions: CBT, DBT, Supportive, and Reframing  Summary: Clinician led check-in regarding current stressors and situation, and review of patient completed daily inventory. Clinician utilized active listening and empathetic response and validated patient emotions. Clinician facilitated processing group on pertinent issues.?    Summary: Jamie Stevens is a 26 y.o. female who presents with depression symptoms. Patient arrived within time allowed. Patient rates her mood at a 7 on a scale of 1-10 with 10 being best. Pt states she feels "overwhelmed." Pt reports concern re: possible eviction and was given resources to assist. Pt states she was able to complete school registration and has a  job interview tomorrow, however struggles to feel positive. Pt states she slept most of the day "to avoid life." Patient able to process. Patient engaged in discussion.        Session Time: 10:00 am - 11:00 am   Participation Level: Active   Behavioral Response: CasualAlertDepressed   Type of Therapy: Group Therapy   Treatment Goals addressed: Coping   Progress Towards Goals: Progressing   Interventions: CBT, DBT, Solution Focused, Strength-based, Supportive, and Reframing   Therapist Response: Cln led discussion on feelings and the role they play for our lives. Cln contextualized feelings as a warning system that brings attention to areas of our lives that need focus. Group members discussed how to pay attention to what the feeling is telling us versus reacting to unpleasant aspects of a feeling.    Therapist Response:  Pt engaged in discussion and reports understanding.            Session Time: 11:00 -12:00   Participation Level: Active   Behavioral Response: CasualAlertDepressed   Type of Therapy: Group Therapy, Occupational Therapy   Treatment Goals addressed: Coping   Progress Towards Goals: Progressing   Interventions: Supportive, Education   Summary:  Occupational Therapy group led by cln E. Hollan.   Therapist Response: Pt participated         Session Time: 12:00 -1:00   Participation Level: Active   Behavioral Response: CasualAlertDepressed   Type of Therapy: Group therapy   Treatment Goals addressed: Coping   Progress Towards Goals: Progressing   Interventions: CBT; Solution focused; Supportive; Reframing   Summary: 12:00 - 12:50: Cln introduced CBT and the way in which it can provide context for addressing stumbling blocks. Group  discussed "the problem is not the problem, the problem is how we're thinking about the problem" and tried to change perspective on current struggles.  12:50 -1:00 Clinician led check-out. Clinician assessed for  immediate needs, medication compliance and efficacy, and safety concerns.   Therapist Response: 12:00 - 12:50: Pt engaged in discussion and is able to attempt reframing using CBT. 12:50 - 1:00 pm: At check-out, patient reports no immediate concerns. Patient demonstrates progress as evidenced by accepting group feedback.  Patient denies SI/HI/self-harm thoughts at the end of group.    Suicidal/Homicidal: Nowithout intent/plan  Plan: Pt will continue in PHP while working to decrease depression and grief symptoms, increase ADLs, and increase ability to manage symptoms in a healthy manner.   Collaboration of Care: Medication Management AEB T Lewis  Patient/Guardian was advised Release of Information must be obtained prior to any record release in order to collaborate their care with an outside provider. Patient/Guardian was advised if they have not already done so to contact the registration department to sign all necessary forms in order for [place] to release information regarding their care.   Consent: Patient/Guardian gives verbal consent for treatment and assignment of benefits for services provided during this visit. Patient/Guardian expressed understanding and agreed to proceed.   Diagnosis: MDD (major depressive disorder), recurrent severe, without psychosis ([place]) [F33.2]    1. MDD (major depressive disorder), recurrent severe, without psychosis ([place])   2. Unresolved grief      Lorin Glass, LCSW

## 2022-07-28 NOTE — Psych (Signed)
Virtual Visit via Video Note  I connected with Jamie Stevens on 04/12/22 at  9:00 AM EDT by a video enabled telemedicine application and verified that I am speaking with the correct person using two identifiers.  Location: Patient: patient home Provider: clinical home office   I discussed the limitations of evaluation and management by telemedicine and the availability of in person appointments. The patient expressed understanding and agreed to proceed.  I discussed the assessment and treatment plan with the patient. The patient was provided an opportunity to ask questions and all were answered. The patient agreed with the plan and demonstrated an understanding of the instructions.   The patient was advised to call back or seek an in-person evaluation if the symptoms worsen or if the condition fails to improve as anticipated.  Pt was provided 240 minutes of non-face-to-face time during this encounter.   [name], LCSW   [facility] PHP THERAPIST PROGRESS NOTE  Jamie Stevens [id number]  Session Time: 9:00 - 10:00  Participation Level: Active  Behavioral Response: CasualAlertDepressed  Type of Therapy: Group Therapy  Treatment Goals addressed: Coping  Progress Towards Goals: Progressing  Interventions: CBT, DBT, Supportive, and Reframing  Summary: Clinician led check-in regarding current stressors and situation, and review of patient completed daily inventory. Clinician utilized active listening and empathetic response and validated patient emotions. Clinician facilitated processing group on pertinent issues.?    Summary: Jamie Stevens is a 26 y.o. female who presents with depression symptoms. Patient arrived within time allowed. Patient rates her mood at a 6 on a scale of 1-10 with 10 being best. Pt states she feels "okay." Pt reports she slept through the day again after becoming overwhelmed when trying to clean her room. Cln reviewed other coping skills pt could  utilize and pt is somewhat open. Pt states she ate one time. Patient able to process. Patient engaged in discussion.        Session Time: 10:00 am - 11:00 am   Participation Level: Active   Behavioral Response: CasualAlertDepressed   Type of Therapy: Group Therapy   Treatment Goals addressed: Coping   Progress Towards Goals: Progressing   Interventions: CBT, DBT, Solution Focused, Strength-based, Supportive, and Reframing   Therapist Response: Cln led discussion on safety and the ways in which we can seek and own it in ourselves. Group members shared how they view safety and situations which hinder safety. Cln encouraged pt's to think about emotional safety and ways to foster it.     Therapist Response: Pt engaged in discussion and reports gaining insight.          Session Time: 11:00 -12:00   Participation Level: Active   Behavioral Response: CasualAlertDepressed   Type of Therapy: Group Therapy, Occupational Therapy   Treatment Goals addressed: Coping   Progress Towards Goals: Progressing   Interventions: Supportive, Education   Summary:  Occupational Therapy group led by cln E. Hollan.   Therapist Response: Pt participated       Session Time: 12:00 -1:00   Participation Level: Active   Behavioral Response: CasualAlertDepressed   Type of Therapy: Group therapy   Treatment Goals addressed: Coping   Progress Towards Goals: Progressing   Interventions: CBT; Solution focused; Supportive; Reframing   Summary: 12:00 - 12:50: Cln continued topic of CBT cognitive distortions and introduced thought challenging as a way to  utilize the "challenge" C in C-C-C. Group utilized Administrator, Civil Service questions" as a way to introduce challenges and reframe distorted thinking.  Group members worked through pt examples to practice challenging distorted thinking.  12:50 -1:00 Clinician led check-out. Clinician assessed for immediate needs, medication compliance and efficacy,  and safety concerns.   Therapist Response: 12:00 - 12:50: Pt engaged in discussion and demonstrates understanding of challenging distorted thoughts through practice.  12:50 - 1:00 pm: At check-out, patient reports no immediate concerns. Patient demonstrates progress as evidenced by decreased SI.  Patient denies SI/HI/self-harm thoughts at the end of group.    Suicidal/Homicidal: Nowithout intent/plan  Plan: Pt will continue in PHP while working to decrease depression and grief symptoms, increase ADLs, and increase ability to manage symptoms in a healthy manner.   Collaboration of Care: Medication Management AEB T Lewis  Patient/Guardian was advised Release of Information must be obtained prior to any record release in order to collaborate their care with an outside provider. Patient/Guardian was advised if they have not already done so to contact the registration department to sign all necessary forms in order for [place] to release information regarding their care.   Consent: Patient/Guardian gives verbal consent for treatment and assignment of benefits for services provided during this visit. Patient/Guardian expressed understanding and agreed to proceed.   Diagnosis: MDD (major depressive disorder), recurrent severe, without psychosis ([place]) [F33.2]    1. MDD (major depressive disorder), recurrent severe, without psychosis ([place])      Lorin Glass, LCSW

## 2022-09-17 DIAGNOSIS — Z3202 Encounter for pregnancy test, result negative: Secondary | ICD-10-CM | POA: Diagnosis not present

## 2022-09-17 DIAGNOSIS — N879 Dysplasia of cervix uteri, unspecified: Secondary | ICD-10-CM | POA: Diagnosis not present

## 2022-09-17 DIAGNOSIS — R87610 Atypical squamous cells of undetermined significance on cytologic smear of cervix (ASC-US): Secondary | ICD-10-CM | POA: Insufficient documentation

## 2022-09-17 DIAGNOSIS — R87810 Cervical high risk human papillomavirus (HPV) DNA test positive: Secondary | ICD-10-CM | POA: Diagnosis not present

## 2022-10-19 ENCOUNTER — Encounter (HOSPITAL_COMMUNITY): Payer: Self-pay | Admitting: Psychiatry

## 2022-10-19 ENCOUNTER — Inpatient Hospital Stay (HOSPITAL_COMMUNITY)
Admission: RE | Admit: 2022-10-19 | Discharge: 2022-10-25 | DRG: 885 | Disposition: A | Discharge status: Home / Self Care | Payer: BLUE CROSS/BLUE SHIELD | Attending: Psychiatry | Admitting: Psychiatry

## 2022-10-19 ENCOUNTER — Other Ambulatory Visit: Payer: Self-pay | Admitting: Psychiatry

## 2022-10-19 ENCOUNTER — Other Ambulatory Visit: Payer: Self-pay

## 2022-10-19 DIAGNOSIS — Z5986 Financial insecurity: Secondary | ICD-10-CM

## 2022-10-19 DIAGNOSIS — Z821 Family history of blindness and visual loss: Secondary | ICD-10-CM | POA: Diagnosis not present

## 2022-10-19 DIAGNOSIS — F332 Major depressive disorder, recurrent severe without psychotic features: Principal | ICD-10-CM | POA: Insufficient documentation

## 2022-10-19 DIAGNOSIS — F4310 Post-traumatic stress disorder, unspecified: Secondary | ICD-10-CM | POA: Diagnosis present

## 2022-10-19 DIAGNOSIS — F411 Generalized anxiety disorder: Secondary | ICD-10-CM | POA: Diagnosis present

## 2022-10-19 DIAGNOSIS — Z6841 Body Mass Index (BMI) 40.0 and over, adult: Secondary | ICD-10-CM | POA: Diagnosis not present

## 2022-10-19 DIAGNOSIS — Z818 Family history of other mental and behavioral disorders: Secondary | ICD-10-CM

## 2022-10-19 DIAGNOSIS — F410 Panic disorder [episodic paroxysmal anxiety] without agoraphobia: Secondary | ICD-10-CM | POA: Diagnosis not present

## 2022-10-19 DIAGNOSIS — Z1152 Encounter for screening for COVID-19: Secondary | ICD-10-CM

## 2022-10-19 DIAGNOSIS — Z833 Family history of diabetes mellitus: Secondary | ICD-10-CM

## 2022-10-19 DIAGNOSIS — K219 Gastro-esophageal reflux disease without esophagitis: Secondary | ICD-10-CM | POA: Diagnosis present

## 2022-10-19 DIAGNOSIS — F1210 Cannabis abuse, uncomplicated: Secondary | ICD-10-CM | POA: Diagnosis present

## 2022-10-19 DIAGNOSIS — Z5982 Transportation insecurity: Secondary | ICD-10-CM | POA: Diagnosis not present

## 2022-10-19 DIAGNOSIS — Z83438 Family history of other disorder of lipoprotein metabolism and other lipidemia: Secondary | ICD-10-CM

## 2022-10-19 DIAGNOSIS — Z811 Family history of alcohol abuse and dependence: Secondary | ICD-10-CM | POA: Diagnosis not present

## 2022-10-19 DIAGNOSIS — Z79899 Other long term (current) drug therapy: Secondary | ICD-10-CM

## 2022-10-19 DIAGNOSIS — E669 Obesity, unspecified: Secondary | ICD-10-CM | POA: Diagnosis not present

## 2022-10-19 DIAGNOSIS — G4700 Insomnia, unspecified: Secondary | ICD-10-CM | POA: Diagnosis not present

## 2022-10-19 DIAGNOSIS — Z9151 Personal history of suicidal behavior: Secondary | ICD-10-CM | POA: Diagnosis not present

## 2022-10-19 DIAGNOSIS — Z8249 Family history of ischemic heart disease and other diseases of the circulatory system: Secondary | ICD-10-CM

## 2022-10-19 DIAGNOSIS — R45851 Suicidal ideations: Secondary | ICD-10-CM | POA: Diagnosis present

## 2022-10-19 DIAGNOSIS — F909 Attention-deficit hyperactivity disorder, unspecified type: Secondary | ICD-10-CM | POA: Diagnosis present

## 2022-10-19 DIAGNOSIS — Z91141 Patient's other noncompliance with medication regimen due to financial hardship: Secondary | ICD-10-CM

## 2022-10-19 DIAGNOSIS — F17210 Nicotine dependence, cigarettes, uncomplicated: Secondary | ICD-10-CM | POA: Diagnosis present

## 2022-10-19 LAB — RESP PANEL BY RT-PCR (RSV, FLU A&B, COVID)  RVPGX2
Influenza A by PCR: NEGATIVE
Influenza B by PCR: NEGATIVE
Resp Syncytial Virus by PCR: NEGATIVE
SARS Coronavirus 2 by RT PCR: NEGATIVE

## 2022-10-19 MED ORDER — ACETAMINOPHEN 325 MG PO TABS: 650.0000 mg | ORAL_TABLET | Freq: Four times a day (QID) | ORAL | Status: DC | PRN

## 2022-10-19 MED ORDER — MAGNESIUM HYDROXIDE 400 MG/5ML PO SUSP: 30.0000 mL | Freq: Every day | ORAL | Status: DC | PRN

## 2022-10-19 MED ORDER — WHITE PETROLATUM EX OINT
TOPICAL_OINTMENT | CUTANEOUS | Status: AC
  Administered 2022-10-19: 1
  Filled 2022-10-19: qty 5

## 2022-10-19 MED ORDER — ALUM & MAG HYDROXIDE-SIMETH 200-200-20 MG/5ML PO SUSP: 30.0000 mL | ORAL | Status: DC | PRN

## 2022-10-19 NOTE — Plan of Care (Signed)
  Problem: Education: Goal: Emotional status will improve Outcome: Not Progressing Goal: Mental status will improve Outcome: Not Progressing   Problem: Activity: Goal: Interest or engagement in activities will improve Outcome: Not Progressing   Problem: Coping: Goal: Coping ability will improve Outcome: Not Progressing   

## 2022-10-19 NOTE — Tx Team (Signed)
Initial Treatment Plan 10/19/2022 10:51 PM Jamie Stevens:[id number]    PATIENT STRESSORS: Educational concerns   Financial difficulties     PATIENT STRENGTHS: Average or above average intelligence  Capable of independent living  General fund of knowledge    PATIENT IDENTIFIED PROBLEMS: Insecure housing  School  SI                 DISCHARGE CRITERIA:  Adequate post-discharge living arrangements Improved stabilization in mood, thinking, and/or behavior Reduction of life-threatening or endangering symptoms to within safe limits  PRELIMINARY DISCHARGE PLAN: Outpatient therapy Placement in alternative living arrangements Return to previous work or school arrangements  PATIENT/FAMILY INVOLVEMENT: This treatment plan has been presented to and reviewed with the patient, Jamie Stevens.  The patient has been given the opportunity to ask questions and make suggestions.  Gardiner Barefoot, RN 10/19/2022, 10:51 PM

## 2022-10-19 NOTE — Progress Notes (Signed)
Patient ID: Jamie Stevens, female   DOB: 11-03-1995, 26 y.o.   MRN: [id number]  Patient is a 26 yo female who walked into [facility] after attempting suicide by hanging. Patient reports that her current stressors are school and insecure housing. She states that she is facing eviction and that her roommate is unable to care for her dog, therefore the leasing office is turning the dog over to the shelter. Patient endorses SI but verbally contracts for safety while on the unit. Patient denies HI and AVH. Patient denies ETOH and tobacco use, but endorses marijuana use of 2G/day. Patient was tearful throughout assessment. Skin check performed with a small abrasion noted on her left elbow. No contraband found. Patient oriented to the unit and unit rules. Patient offered food and beverage. Safety checks in place. Patient remains safe at this time.

## 2022-10-19 NOTE — H&P (Cosign Needed Addendum)
Behavioral Health Medical Screening Exam  Jamie Stevens is an 26 y.o. 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   Patient presents alert and oriented. Withdrawn, flat, tearful, and depressed affect. She reports currently being a senior at [employer] where she was notified she is on academic dismissal and is being evicted as a result. She states both parents are deceased and is estranged from other family in the area. Minimal supports. No active outpatient services. She reports history of [facility] hospitalization x2; last 02/2022.  She endorses 2 gram/day marijuana use; denies any alcohol or other illicit substance use. Family history of depression in mother, father, paternal aunt/uncle; father attempted suicide while patient was in 3rd grade. She reports current symptoms as poor sleep, anhedonia with increased isolation, feelings of guilt and worthlessness, low energy, poor concentration, inconsistent appetite, and active suicidal ideations including an attempt to hang herself. She verbalizes feeling unsafe and is unable to contract for safety at this time.   Total Time spent with patient: 45 minutes  Psychiatric Specialty Exam: Physical Exam Vitals and nursing note reviewed.  Constitutional:      General: She is not in acute distress.    Appearance: She is obese.  HENT:     Head: Normocephalic.     Nose: Nose normal.     Mouth/Throat:     Pharynx: Oropharynx is clear.  Eyes:     Pupils: Pupils are equal, round, and reactive to light.  Cardiovascular:     Pulses: Normal pulses.  Pulmonary:     Effort: Pulmonary effort is normal.  Musculoskeletal:        General: Normal range of motion.      Cervical back: Normal range of motion.  Skin:    General: Skin is dry.  Neurological:     Mental Status: She is oriented to person, place, and time.  Psychiatric:        Attention and Perception: She does not perceive auditory or visual hallucinations.        Mood and Affect: Mood is depressed. Affect is flat and tearful.        Speech: Speech normal.        Behavior: Behavior is withdrawn. Behavior is cooperative.        Thought Content: Thought content is not paranoid or delusional. Thought content includes suicidal ideation. Thought content does not include homicidal ideation. Thought content includes suicidal plan. Thought content does not include homicidal plan.        Cognition and Memory: Cognition and memory normal.        Judgment: Judgment is impulsive and inappropriate.    Review of Systems  Constitutional:  Positive for activity change and appetite change.  Psychiatric/Behavioral:  Positive for decreased concentration, dysphoric mood, self-injury, sleep disturbance and suicidal ideas.   All other systems reviewed and are negative.  Blood pressure 138/85, pulse 72, temperature 98.3 F (36.8 C), temperature source Oral, resp. rate 18, SpO2 99 %, unknown if currently breastfeeding.There is no height or weight on file to calculate BMI. General Appearance: Casual and Disheveled Eye Contact:  Poor Speech:  Clear and Coherent Volume:  Normal Mood:  Depressed and Dysphoric Affect:  Blunt, Congruent, and Tearful Thought Process:  Linear Orientation:  Full (Time,  Place, and Person) Thought Content:  Logical Suicidal Thoughts:  Yes.  with intent/plan Homicidal Thoughts:  Yes.  without intent/plan Memory:  Immediate;   Fair Recent;   Fair Judgement:  Fair Insight:  Present Psychomotor Activity:  Normal Concentration: Concentration: Fair and Attention Span: Fair Recall:  [employer] of Knowledge:Fair Language: Fair Akathisia:  NA Handed:  Right AIMS (if indicated):     Assets:  Communication Skills Desire for Improvement Physical Health Resilience Vocational/Educational Sleep:     Musculoskeletal: Strength & Muscle Tone: within normal limits Gait & Station: normal Patient leans: N/A  Blood pressure 138/85, pulse 72, temperature 98.3 F (36.8 C), temperature source Oral, resp. rate 18, SpO2 99 %, unknown if currently breastfeeding.  [place] Scale:  Flowsheet Row [facility] from 03/28/2022 in [facility] Counselor from 03/23/2022 in [facility]  C-SSRS RISK CATEGORY High Risk High Risk High Risk      Recommendations: Based on my evaluation the patient does not appear to have an emergency medical condition. Patient recommended for inpatient psychiatric services. Accepted to [facility].   Loletta Parish, NP 10/19/2022, 3:49 PM

## 2022-10-20 DIAGNOSIS — F411 Generalized anxiety disorder: Secondary | ICD-10-CM | POA: Insufficient documentation

## 2022-10-20 DIAGNOSIS — F332 Major depressive disorder, recurrent severe without psychotic features: Secondary | ICD-10-CM | POA: Diagnosis not present

## 2022-10-20 DIAGNOSIS — F4310 Post-traumatic stress disorder, unspecified: Secondary | ICD-10-CM | POA: Insufficient documentation

## 2022-10-20 LAB — LIPID PANEL
Cholesterol: 185 mg/dL (ref 0–200)
HDL: 50 mg/dL (ref >40)
LDL Cholesterol: 116 mg/dL — ABNORMAL HIGH (ref 0–99)
Total CHOL/HDL Ratio: 3.7 RATIO
Triglycerides: 94 mg/dL (ref <150)
VLDL: 19 mg/dL (ref 0–40)

## 2022-10-20 LAB — URINALYSIS, ROUTINE W REFLEX MICROSCOPIC
Bacteria, UA: NONE SEEN
Bilirubin Urine: NEGATIVE
Glucose, UA: NEGATIVE mg/dL
Ketones, ur: NEGATIVE mg/dL
Leukocytes,Ua: NEGATIVE
Nitrite: NEGATIVE
Protein, ur: 30 mg/dL — AB
RBC / HPF: >50 RBC/hpf — ABNORMAL HIGH (ref 0–5)
Specific Gravity, Urine: 1.018 (ref 1.005–1.030)
pH: 6 (ref 5.0–8.0)

## 2022-10-20 LAB — COMPREHENSIVE METABOLIC PANEL
ALT: 17 U/L (ref 0–44)
AST: 17 U/L (ref 15–41)
Albumin: 3.7 g/dL (ref 3.5–5.0)
Alkaline Phosphatase: 53 U/L (ref 38–126)
Anion gap: 8 (ref 5–15)
BUN: 8 mg/dL (ref 6–20)
CO2: 25 mmol/L (ref 22–32)
Calcium: 9.1 mg/dL (ref 8.9–10.3)
Chloride: 106 mmol/L (ref 98–111)
Creatinine, Ser: 0.82 mg/dL (ref 0.44–1.00)
GFR, Estimated: >60 mL/min (ref >60)
Glucose, Bld: 117 mg/dL — ABNORMAL HIGH (ref 70–99)
Potassium: 3.5 mmol/L (ref 3.5–5.1)
Sodium: 139 mmol/L (ref 135–145)
Total Bilirubin: 0.7 mg/dL (ref 0.3–1.2)
Total Protein: 7.8 g/dL (ref 6.5–8.1)

## 2022-10-20 LAB — PREGNANCY, URINE: Preg Test, Ur: NEGATIVE

## 2022-10-20 LAB — CBC
HCT: 36.2 % (ref 36.0–46.0)
Hemoglobin: 11.5 g/dL — ABNORMAL LOW (ref 12.0–15.0)
MCH: 27.3 pg (ref 26.0–34.0)
MCHC: 31.8 g/dL (ref 30.0–36.0)
MCV: 86 fL (ref 80.0–100.0)
Platelets: 219 10*3/uL (ref 150–400)
RBC: 4.21 MIL/uL (ref 3.87–5.11)
RDW: 14.1 % (ref 11.5–15.5)
WBC: 6.3 10*3/uL (ref 4.0–10.5)
nRBC: 0 % (ref 0.0–0.2)

## 2022-10-20 LAB — TSH: TSH: 2.026 u[IU]/mL (ref 0.350–4.500)

## 2022-10-20 LAB — RAPID URINE DRUG SCREEN, HOSP PERFORMED
Amphetamines: NOT DETECTED
Barbiturates: NOT DETECTED
Benzodiazepines: NOT DETECTED
Cocaine: NOT DETECTED
Opiates: NOT DETECTED
Tetrahydrocannabinol: POSITIVE — AB

## 2022-10-20 MED ORDER — FLUOXETINE HCL 20 MG PO CAPS
20.0000 mg | ORAL_CAPSULE | Freq: Every day | ORAL | Status: DC
  Administered 2022-10-20 – 2022-10-25 (×6): 20 mg via ORAL
  Filled 2022-10-20 (×9): qty 1

## 2022-10-20 MED ORDER — HYDROXYZINE HCL 25 MG PO TABS
25.0000 mg | ORAL_TABLET | Freq: Three times a day (TID) | ORAL | Status: DC | PRN
  Administered 2022-10-21 – 2022-10-22 (×2): 25 mg via ORAL
  Filled 2022-10-20 (×2): qty 1

## 2022-10-20 MED ORDER — CLONIDINE HCL 0.1 MG PO TABS
0.1000 mg | ORAL_TABLET | Freq: Every day | ORAL | Status: DC
  Administered 2022-10-20 – 2022-10-22 (×3): 0.1 mg via ORAL
  Filled 2022-10-20 (×6): qty 1

## 2022-10-20 MED ORDER — WHITE PETROLATUM EX OINT
TOPICAL_OINTMENT | CUTANEOUS | Status: AC
  Filled 2022-10-20: qty 5

## 2022-10-20 NOTE — Progress Notes (Signed)
Patient has been pleasant and cooperative this shift. She took her medications as prescribed today and was cooperative during assessments. She has been participating in the milleu as well. Patient endorses some passive SI but contracts for safety on the unit. Denies HI/AVH.

## 2022-10-20 NOTE — Progress Notes (Signed)
EKG and urine sample obtained. EKG placed on chart. Urine sample picked up by lab.

## 2022-10-20 NOTE — Group Note (Signed)
LCSW Group Therapy Note  10/20/2022      Type of Therapy and Topic:  Group Therapy: Gratitude   Description:   Group could not be held by social worker, but licensed RN did provide group.  A handout was given to each patient, with the following information:   Gratitude  "Acknowledging the good that you already have in your life is the foundation for all abundance." - Eckhart Tolle  " 'Enough' is a feast." - Buddhist Proverb  "Gratitude sweetens even the smallest moments."  "It is not joy that makes us grateful; It is gratitude that makes us joyful." - David Steindl-Rast    Put at least one response under each category of something for which you are grateful:  People:  Experiences:  Things:  Places:  Skills:  Other:  Add more responses as you get ideas from other people.   Therapeutic Modalities:   Activity  Ilze Roselli J Grossman-Orr, LCSW   

## 2022-10-20 NOTE — Progress Notes (Signed)
Adult Psychoeducational Group Note  Date:  10/20/2022 Time:  10:00 PM  Group Topic/Focus:  Wrap-Up Group:   The focus of this group is to help patients review their daily goal of treatment and discuss progress on daily workbooks.  Participation Level:  Active  Participation Quality:  Appropriate  Affect:  Appropriate  Cognitive:  Appropriate  Insight: Appropriate  Engagement in Group:  Developing/Improving  Modes of Intervention:  Discussion  Additional Comments:  Pt stated her goal for today was to focus on her treatment plan. Pt stated she accomplished her goal today. Pt stated she talked with her treatment team today about her care. Pt rated her overall day a 5 out of 10. Pt stated she was able to contact her friends today which improved her overall day. Pt stated she felt better about herself tonight. Pt stated she was able to attend all meals today. Pt stated she took all medications provided today. Pt stated her appetite was pretty good today. Pt rated her sleep last night was pretty good. Pt stated the goal tonight was to get some rest. Pt stated she had no physical pain tonight. Pt deny visual hallucinations and auditory issues tonight. Pt denies thoughts of harming herself or others. Pt stated she would alert staff if anything changed.  Felipa Furnace 10/20/2022, 10:00 PM

## 2022-10-20 NOTE — [facility] Suicide Risk Assessment (Signed)
Suicide Risk Assessment  Admission Assessment    [facility] Admission Suicide Risk Assessment   Nursing information obtained from:    Demographic factors:  Adolescent or young adult, Low socioeconomic status Current Mental Status:  Suicidal ideation indicated by patient, Suicide plan, Self-harm thoughts, Self-harm behaviors, Intention to act on suicide plan Loss Factors:  Financial problems / change in socioeconomic status Historical Factors:  Prior suicide attempts Risk Reduction Factors:  Living with another person, especially a relative, Positive social support  Total Time spent with patient: 45 minutes Principal Problem: MDD (major depressive disorder), recurrent severe, without psychosis (HCC) Diagnosis:  Principal Problem:   MDD (major depressive disorder), recurrent severe, without psychosis (HCC) Active Problems:   Suicidal ideation   PTSD (post-traumatic stress disorder)   Generalized anxiety disorder  Subjective Data:   Jamie Stevens is a 26 year old, African-American female with a past psychiatric history significant for major depressive disorder, ADHD, cannabis use, and past suicide attempts who voluntarily presented to [facility] as a walk-in for the assessment of the suicidal ideations with reported recent attempt.   Patient reports that she attempted suicide last Tuesday by attempting to hang herself.  She reports that she attempted to hang herself at her apartment.  Patient states that her suicide attempt was triggered by recently being kicked out of school and losing her housing.  Patient states that her parents being deceased also contributed to her suicide attempt.  Patient reports that she was kicked out of school due to being on academic dismissal after not being able to pass one of her classes.  Patient states that she will have to wait a year before she can appeal to be placed back in school.  Patient endorses the following stressors: ongoing grief,  recent miscarriage, and dealing with her eviction during the semester.   Patient endorses depression and states that she is currently not taking any medications.  Patient reports that she was originally taking medications but has been off her medications since the last time she was discharged from this facility.  Patient states that she was last admitted to [facility] back in May after attempting suicide via drug overdose.  She states that the medications she was placed on during her last admission were helpful but is unable to remember the names of her medications.  Patient states that she has been dealing with depression for the past year.  She states that her depressive symptoms are alleviated by sleeping and smoking marijuana but worsened by stress.  Patient endorses the following depressive symptoms: insomnia, hypersomnia, anhedonia, low mood, feelings of guilt/worthlessness, hopelessness, decreased energy, decreased concentration, self-isolation, fluctuating weight, fluctuating appetite, agitation, psychomotor retardation, memory issues, decreased libido, and suicidal ideations.   In addition to depression, patient also endorses anxiety and rates her anxiety at 10 out of 10.  Patient's anxiety is characterized by the following: agoraphobia, excessive worrying, and social anxiety.  Patient also endorses panic attacks and experiences panic attacks at least once a month.  Patient reports that her panic attacks are triggered by stress.  Patient's panic attacks are characterized by the following symptoms: hyperventilation, the feeling of passing out, muffled hearing, and crying spells.  Patient states that her last manic episode occurred roughly 2 months ago and was characterized by being without sleep for 2 to 3 days.  During her most recent manic episode, patient stated that she felt like she was losing her mind, internally; and was also spending all the money she had.  Patient also endorsed distractibility,  racing thoughts, irritability, and mood swings.  Patient denied symptoms of psychosis prior to her admission to this facility.   Patient was last hospitalized at [facility] on 03/07/2022 and was discharged on 03/13/2022.  Prior to discharge, patient was placed on the following psychiatric medications: Fluoxetine 20 mg daily, hydroxyzine 25 mg 3 times daily as needed, and mirtazapine 7.5 mg at bedtime.  Patient states that she stopped taking her medications due to not having transportation and lack of funds.  Patient continues to endorse depression she rates at an 8 out of 10 with 10 being most severe.  Patient also endorses anxiety she rates at 10 out of 10.  Patient continues to endorse suicidal ideations but denies any specific plan or intent.  Patient denies homicidal ideations.  Patient further denies auditory or visual hallucinations and does not appear to be responding to internal/external stimuli.  Patient denies paranoia or delusional thoughts.  Patient endorses okay sleep and states that she receives on average 6 to 7 hours the night before.  Patient endorses okay appetite.   Patient is casually dressed, alert and oriented x 4.  Patient is calm, cooperative, and fully engaged in conversation during the encounter.  Patient maintains good eye contact.  Patient's speech is clear, coherent, and with normal rate.  Patient's thought process is organized.  Patient's thought content is coherent.  Patient endorses depression and anxiety with congruent affect.  Patient endorses suicidal ideations but is able to contract for safety.  Patient denies paranoia or delusional thoughts.  Patient does not appear to be responding to internal/external stimuli.  Continued Clinical Symptoms:   During her assessment, patient continues to endorse depression and anxiety.  Patient also endorses suicidal ideations but denies having a specific plan or intent.  Patient denies psychotic symptoms at this time.  Alcohol Use Disorder  Identification Test Final Score (AUDIT): 1 The "Alcohol Use Disorders Identification Test", Guidelines for Use in Primary Care, Second Edition.  World Science writer [facility]). Score between 0-7:  no or low risk or alcohol related problems. Score between 8-15:  moderate risk of alcohol related problems. Score between 16-19:  high risk of alcohol related problems. Score 20 or above:  warrants further diagnostic evaluation for alcohol dependence and treatment.   CLINICAL FACTORS:   Severe Anxiety and/or Agitation Depression:   Anhedonia Comorbid alcohol abuse/dependence Hopelessness Impulsivity Insomnia Severe Alcohol/Substance Abuse/Dependencies More than one psychiatric diagnosis Previous Psychiatric Diagnoses and Treatments   Musculoskeletal: Strength & Muscle Tone: within normal limits Gait & Station: normal Patient leans: N/A  Psychiatric Specialty Exam:  Presentation  General Appearance:  Appropriate for Environment; Casual; Fairly Groomed  Eye Contact: Good  Speech: Clear and Coherent; Normal Rate  Speech Volume: Normal  Handedness: Ambidextrous   Mood and Affect  Mood: Anxious; Depressed  Affect: Tearful; Congruent   Thought Process  Thought Processes: Coherent; Linear; Goal Directed  Descriptions of Associations:Intact  Orientation:Full (Time, Place and Person)  Thought Content:Logical  History of Schizophrenia/Schizoaffective disorder:No  Duration of Psychotic Symptoms:No data recorded Hallucinations:Hallucinations: None  Ideas of Reference:None  Suicidal Thoughts:Suicidal Thoughts: Yes, Passive SI Passive Intent and/or Plan: Without Intent; Without Plan; Without Means to Carry Out  Homicidal Thoughts:No data recorded  Sensorium  Memory: Immediate Good; Recent Good; Remote Good  Judgment: Fair  Insight: Fair   Executive Functions  Concentration: Good  Attention Span: Good  Recall: Peri Jefferson  Fund of  Knowledge: Fair  Language: Good   Psychomotor Activity  Psychomotor Activity: Psychomotor Activity: Normal   Assets  Assets: Communication Skills; Desire for Improvement; Social Support   Sleep  Sleep: Sleep: Fair    Physical Exam: Physical Exam Constitutional:      Appearance: Normal appearance.  HENT:     Head: Normocephalic and atraumatic.     Nose: Nose normal.     Mouth/Throat:     Mouth: Mucous membranes are moist.  Eyes:     Extraocular Movements: Extraocular movements intact.     Pupils: Pupils are equal, round, and reactive to light.  Cardiovascular:     Rate and Rhythm: Normal rate and regular rhythm.  Pulmonary:     Effort: Pulmonary effort is normal.     Breath sounds: Normal breath sounds.  Abdominal:     General: Abdomen is flat.  Musculoskeletal:        General: Normal range of motion.     Cervical back: Normal range of motion and neck supple.  Skin:    General: Skin is warm and dry.  Neurological:     General: No focal deficit present.     Mental Status: She is alert and oriented to person, place, and time.  Psychiatric:        Attention and Perception: Attention and perception normal. She does not perceive auditory or visual hallucinations.        Mood and Affect: Mood is anxious and depressed. Affect is tearful.        Speech: Speech normal.        Behavior: Behavior normal. Behavior is cooperative.        Thought Content: Thought content is not paranoid or delusional. Thought content includes suicidal ideation. Thought content does not include homicidal ideation. Thought content does not include suicidal plan.        Cognition and Memory: Cognition and memory normal.        Judgment: Judgment is impulsive.    Review of Systems  Constitutional: Negative.   HENT: Negative.    Eyes: Negative.   Respiratory: Negative.    Cardiovascular: Negative.   Gastrointestinal: Negative.   Skin: Negative.   Neurological: Negative.    Psychiatric/Behavioral:  Positive for depression, substance abuse and suicidal ideas. Negative for hallucinations. The patient is nervous/anxious. The patient does not have insomnia.    Blood pressure (!) 146/74, pulse 84, temperature 98.1 F (36.7 C), temperature source Oral, resp. rate 20, height 5\' 7"  (1.702 m), weight 136 kg, SpO2 100 %, unknown if currently breastfeeding. Body mass index is 46.96 kg/m.   COGNITIVE FEATURES THAT CONTRIBUTE TO RISK:  None    SUICIDE RISK:  Severe:  Frequent, intense, and enduring suicidal ideation, specific plan, no subjective intent, but some objective markers of intent (i.e., choice of lethal method), the method is accessible, some limited preparatory behavior, evidence of impaired self-control, severe dysphoria/symptomatology, multiple risk factors present, and few if any protective factors, particularly a lack of social support.  PLAN OF CARE:   Plan:   Patient was [facility] due to suicide attempt.  [facility] due to drug overdose and was discharged on the following medications: Prozac 20 mg daily, hydroxyzine 25 mg 3 times daily as needed, and Remeron 7.5 mg at bedtime.  Patient was unable to be compliant with her last medication regimen due to lack of funds and having no transportation.  Patient continues to endorse depression and anxiety.  She also endorses suicidal ideations but  denies having a specific plan or intent.  Patient to be placed back on Prozac 20 mg daily for the management of her depressive symptoms, anxiety, and PTSD.  Patient to also be placed on clonidine for the management of her ADHD symptoms, PTSD, and anxiety.   Safety and Monitoring: Voluntary admission to inpatient psychiatric unit for safety, stabilization and treatment Daily contact with patient to assess and evaluate symptoms and progress in treatment Patient's case to be discussed in multi-disciplinary team  meeting Observation Level : q15 minute checks Vital signs: q12 hours Precautions: suicide, elopement, and assault    Diagnosis:  Principal Problem:   MDD (major depressive disorder), recurrent severe, without psychosis (HCC) Active Problems:   Suicidal ideation   PTSD (post-traumatic stress disorder)   Generalized anxiety disorder   #Major depressive disorder, recurrent severe, without psychosis -Start Prozac 20 mg daily for depression   #Generalized anxiety disorder -Start Prozac 20 mg daily for anxiety -Start clonidine 0.1 mg at bedtime for anxiety -Start hydroxyzine 25 mg 3 times daily as needed for anxiety   #PTSD -Start Prozac 20 mg daily for PTSD -Start clonidine 0.1 mg at bedtime for PTSD   As needed medications: -Patient to continue taking Tylenol 650 mg every 6 hours as needed for mild pain -Patient to continue taking Maalox/Mylanta 30 mL every 4 hours as needed for indigestion -Patient to continue taking Milk of Magnesia 30 mL as needed for mild constipation   The following labs were ordered: TSH, lipid panel, complete blood count, and CMP.  CMP was significant for elevated blood glucose (117 mg/dL).  Complete blood count was significant for decreased hemoglobin (11.5 g/dL).  Lipid panel was significant for elevated LDL (116 mg/dL).  TSH within normal limits   Discharge Planning: Social work and case management to assist with discharge planning and identification of hospital follow-up needs prior to discharge Estimated LOS: 5-7 days Discharge Concerns: Need to establish a safety plan; Medication compliance and effectiveness Discharge Goals: Return home with outpatient referrals for mental health follow-up including medication management/psychotherapy  I certify that inpatient services furnished can reasonably be expected to improve the patient's condition.   Meta Hatchet, PA 10/20/2022, 4:19 PM
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
## 2022-10-20 NOTE — Plan of Care (Signed)
  Problem: Education: Goal: Knowledge of [facility] Education information/materials will improve Outcome: Progressing Goal: Emotional status will improve Outcome: Progressing Goal: Mental status will improve Outcome: Progressing Goal: Verbalization of understanding the information provided will improve Outcome: Progressing   Problem: Activity: Goal: Interest or engagement in activities will improve Outcome: Progressing Goal: Sleeping patterns will improve Outcome: Progressing   Problem: Coping: Goal: Ability to verbalize frustrations and anger appropriately will improve Outcome: Progressing Goal: Ability to demonstrate self-control will improve Outcome: Progressing   Problem: Health Behavior/Discharge Planning: Goal: Identification of resources available to assist in meeting health care needs will improve Outcome: Progressing Goal: Compliance with treatment plan for underlying cause of condition will improve Outcome: Progressing   Problem: Physical Regulation: Goal: Ability to maintain clinical measurements within normal limits will improve Outcome: Progressing   Problem: Safety: Goal: Periods of time without injury will increase Outcome: Progressing   Problem: Education: Goal: Ability to make informed decisions regarding treatment will improve Outcome: Progressing   Problem: Medication: Goal: Compliance with prescribed medication regimen will improve Outcome: Progressing   Problem: Self-Concept: Goal: Ability to disclose and discuss suicidal ideas will improve Outcome: Progressing Goal: Will verbalize positive feelings about self Outcome: Progressing   Problem: Education: Goal: Ability to state activities that reduce stress will improve Outcome: Progressing   Problem: Coping: Goal: Ability to identify and develop effective coping behavior will improve Outcome: Progressing   Problem: Self-Concept: Goal: Ability to identify factors that promote  anxiety will improve Outcome: Progressing Goal: Level of anxiety will decrease Outcome: Progressing Goal: Ability to modify response to factors that promote anxiety will improve Outcome: Progressing   Problem: Education: Goal: Utilization of techniques to improve thought processes will improve Outcome: Progressing Goal: Knowledge of the prescribed therapeutic regimen will improve Outcome: Progressing   Problem: Activity: Goal: Interest or engagement in leisure activities will improve Outcome: Progressing Goal: Imbalance in normal sleep/wake cycle will improve Outcome: Progressing   Problem: Coping: Goal: Coping ability will improve Outcome: Progressing Goal: Will verbalize feelings Outcome: Progressing   Problem: Health Behavior/Discharge Planning: Goal: Ability to make decisions will improve Outcome: Progressing Goal: Compliance with therapeutic regimen will improve Outcome: Progressing   Problem: Role Relationship: Goal: Will demonstrate positive changes in social behaviors and relationships Outcome: Progressing   Problem: Safety: Goal: Ability to disclose and discuss suicidal ideas will improve Outcome: Progressing Goal: Ability to identify and utilize support systems that promote safety will improve Outcome: Progressing   Problem: Self-Concept: Goal: Will verbalize positive feelings about self Outcome: Progressing Goal: Level of anxiety will decrease Outcome: Progressing

## 2022-10-20 NOTE — Plan of Care (Signed)
  Problem: Education: Goal: Emotional status will improve Outcome: Progressing Goal: Mental status will improve Outcome: Progressing   Problem: Activity: Goal: Sleeping patterns will improve Outcome: Progressing   Problem: Health Behavior/Discharge Planning: Goal: Compliance with treatment plan for underlying cause of condition will improve Outcome: Progressing   Problem: Medication: Goal: Compliance with prescribed medication regimen will improve Outcome: Progressing

## 2022-10-20 NOTE — H&P (Signed)
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 oriented to person, place, and time.  Psychiatric:        Attention and Perception: Attention and perception normal. She does not perceive auditory or visual hallucinations.        Mood and Affect: Mood is anxious and depressed. Affect is tearful.        Speech: Speech normal.        Behavior: Behavior normal. Behavior is cooperative.        Thought Content: Thought content is not paranoid or delusional.  Thought content includes suicidal ideation. Thought content does not include homicidal ideation. Thought content does not include suicidal plan.        Cognition and Memory: Cognition and memory normal.        Judgment: Judgment is impulsive.    Review of Systems  Constitutional: Negative.   HENT: Negative.    Eyes: Negative.   Respiratory: Negative.    Cardiovascular: Negative.   Gastrointestinal: Negative.   Skin: Negative.   Neurological: Negative.   Psychiatric/Behavioral:  Positive for depression, substance abuse and suicidal ideas. Negative for hallucinations. The patient is nervous/anxious. The patient does not have insomnia.    Blood pressure (!) 146/74, pulse 84, temperature 98.1 F (36.7 C), temperature source Oral, resp. rate 20, height 5\' 7"  (1.702 m), weight 136 kg, SpO2 100 %, unknown if currently breastfeeding. Body mass index is 46.96 kg/m.  Treatment Plan Summary:  Daily contact with patient to assess and evaluate symptoms and progress in treatment and Medication management  Observation Level/Precautions:  15 minute checks  Laboratory: Labs to be independently reviewed on 10/20/2022  Psychotherapy:  Unit group sessions  Medications:  See [facility]  Consultations:  To be determined  Discharge Concerns: Mood stability, medication compliance, safety  Estimated LOS: 5 - 7 days  Other:  N/A   Physician Treatment Plan for Primary Diagnosis: MDD (major depressive disorder), recurrent severe, without psychosis (HCC) Long Term Goal(s): Improvement in symptoms so as ready for discharge  Short Term Goals: Ability to identify changes in lifestyle to reduce recurrence of condition will improve, Ability to verbalize feelings will improve, Ability to disclose and discuss suicidal ideas, Ability to demonstrate self-control will improve, Ability to identify and develop effective coping behaviors will improve, Ability to maintain clinical measurements within normal limits will improve,  Compliance with prescribed medications will improve, and Ability to identify triggers associated with substance abuse/mental health issues will improve  Physician Treatment Plan for Secondary Diagnosis: Principal Problem:   MDD (major depressive disorder), recurrent severe, without psychosis (HCC) Active Problems:   Suicidal ideation   PTSD (post-traumatic stress disorder)   Generalized anxiety disorder  Long Term Goal(s): Improvement in symptoms so as ready for discharge  Short Term Goals: Ability to identify changes in lifestyle to reduce recurrence of condition will improve, Ability to verbalize feelings will improve, Ability to disclose and discuss suicidal ideas, Ability to demonstrate self-control will improve, Ability to identify and develop effective coping behaviors will improve, Ability to maintain clinical measurements within normal limits will improve, Compliance with prescribed medications will improve, and Ability to identify triggers associated with  substance abuse/mental health issues will improve  Plan:  Patient was voluntarily admitted to [facility] due to suicide attempt.  Patient was last admitted to [facility] on 03/07/2022 due to drug overdose and was discharged on the following medications: Prozac 20 mg daily, hydroxyzine 25 mg 3 times daily as needed, and Remeron 7.5 mg at bedtime.  Patient was unable to be compliant with her last medication regimen due to lack of funds and having no transportation.  Patient continues to endorse depression and anxiety.  She also endorses suicidal ideations but denies having a specific plan or intent.  Patient to be placed back on Prozac 20 mg daily for the management of her depressive symptoms, anxiety, and PTSD.  Patient to also be placed on clonidine for the management of her ADHD symptoms, PTSD, and anxiety.  Safety and Monitoring: Voluntary admission to inpatient psychiatric unit for safety, stabilization and treatment Daily contact with  patient to assess and evaluate symptoms and progress in treatment Patient's case to be discussed in multi-disciplinary team meeting Observation Level : q15 minute checks Vital signs: q12 hours Precautions: suicide, elopement, and assault   Diagnosis:  Principal Problem:   MDD (major depressive disorder), recurrent severe, without psychosis ([facility]) Active Problems:   Suicidal ideation   PTSD (post-traumatic stress disorder)   Generalized anxiety disorder  #Major depressive disorder, recurrent severe, without psychosis -Start Prozac 20 mg daily for depression  #Generalized anxiety disorder -Start Prozac 20 mg daily for anxiety -Start clonidine 0.1 mg at bedtime for anxiety -Start hydroxyzine 25 mg 3 times daily as needed for anxiety  #PTSD -Start Prozac 20 mg daily for PTSD -Start clonidine 0.1 mg at bedtime for PTSD  As needed medications: -Patient to continue taking Tylenol 650 mg every 6 hours as needed for mild pain -Patient to continue taking Maalox/Mylanta 30 mL every 4 hours as needed for indigestion -Patient to continue taking Milk of Magnesia 30 mL as needed for mild constipation  The following labs were ordered: TSH, lipid panel, complete blood count, and CMP.  CMP was significant for elevated blood glucose (117 mg/dL).  Complete blood count was significant for decreased hemoglobin (11.5 g/dL).  Lipid panel was significant for elevated LDL (116 mg/dL).  TSH within normal limits  Discharge Planning: Social work and case management to assist with discharge planning and identification of hospital follow-up needs prior to discharge Estimated LOS: 5-7 days Discharge Concerns: Need to establish a safety plan; Medication compliance and effectiveness Discharge Goals: Return home with outpatient referrals for mental health follow-up including medication management/psychotherapy   I certify that inpatient services furnished can reasonably be expected to improve the patient's  condition.    Malachy Mood, PA 12/23/20233:11 PM

## 2022-10-20 NOTE — [facility] Group Notes (Signed)
Adult Psychoeducational Group Note Date:  10/20/2022 Time:  0900-1000 Group Topic/Focus: PROGRESSIVE RELAXATION. A group where deep breathing is taught and tensing and relaxation muscle groups is used. Imagery is used as well.  Pts are asked to imagine 3 pillars that hold them up when they are not able to hold themselves up and to share that with the group.   Participation Level:  Active  Participation Quality:  Appropriate  Affect:  Appropriate  Cognitive:  Approprate  Insight: Improving  Engagement in Group:  Engaged  Modes of Intervention:  deep breathing, Imagery. Discussion  Additional Comments:  States  her energy is a 2.5/10. States her friends, her hob and music hold her up.   : Jamie Stevens

## 2022-10-20 NOTE — Progress Notes (Signed)
D- Patient alert and oriented. Denies SI, HI, AVH, and pain. Patient reported improved moved since an argument over the phone with her aunt. Anxiety rated as an 8/10 and depression as 6/10. Patient appeared fidgety. Patient seen in the dayroom interacting with other patients.  A-Scheduled medications administered to patient, per MAR. Support and encouragement provided.  Routine safety checks conducted every 15 minutes.  Patient informed to notify staff with problems or concerns.  R- No adverse drug reactions noted. Patient contracts for safety at this time. Patient compliant with medications and treatment plan. Patient receptive, calm, and cooperative. Patient interacts well with others on the unit.  Patient remains safe at this time.

## 2022-10-21 DIAGNOSIS — F332 Major depressive disorder, recurrent severe without psychotic features: Secondary | ICD-10-CM | POA: Diagnosis not present

## 2022-10-21 LAB — SARS CORONAVIRUS 2 BY RT PCR: SARS Coronavirus 2 by RT PCR: NEGATIVE

## 2022-10-21 NOTE — Progress Notes (Signed)
   10/21/22 2149  Psych Admission Type (Psych Patients Only)  Admission Status Voluntary  Psychosocial Assessment  Patient Complaints Anxiety;Depression  Eye Contact Fair  Facial Expression Animated  Affect Appropriate to circumstance  Speech Soft;Logical/coherent  Interaction Assertive  Motor Activity Other (Comment) (WDL)  Appearance/Hygiene Disheveled  Behavior Characteristics Cooperative;Appropriate to situation  Mood Pleasant;Anxious  Thought Process  Coherency WDL  Content WDL  Delusions None reported or observed  Perception WDL  Hallucination None reported or observed  Judgment Poor  Confusion None  Danger to Self  Current suicidal ideation? Denies  Self-Injurious Behavior No self-injurious ideation or behavior indicators observed or expressed   Agreement Not to Harm Self Yes  Description of Agreement Verbally contracts for safety.  Danger to Others  Danger to Others None reported or observed   Patient alert and oriented. Presenting appropriate to circumstance with a pleasant, anxious mood. Patient denies SI, HI, AVH, and pain. Patient endorses anxiety rated 6/10 and depression 5/10, on 0-10 scale with 0 being the least and 10 being the worst. PRN hydroxyzine administered, per provider orders. Scheduled medication administered to patient, per provider orders. Support and encouragement provided. Routine safety checks conducted every 15 minutes. Patient verbally contracts for safety. Patient remains safe on the unit.

## 2022-10-21 NOTE — Group Note (Signed)
Date:  10/21/2022 Time:  5:29 PM  Group Topic/Focus:  Early Warning Signs:   The focus of this group is to help patients identify signs or symptoms they exhibit before slipping into an unhealthy state or crisis.    Participation Level:  Active  Participation Quality:  Appropriate  Affect:  Appropriate  Cognitive:  Appropriate  Insight: Appropriate  Engagement in Group:  Lacking  Modes of Intervention:  Exploration  Additional Comments:     Reymundo Poll 10/21/2022, 5:29 PM

## 2022-10-21 NOTE — Group Note (Unsigned)
Date:  10/21/2022 Time:  2:09 PM  Group Topic/Focus:  Goals Group:   The focus of this group is to help patients establish daily goals to achieve during treatment and discuss how the patient can incorporate goal setting into their daily lives to aide in recovery. Orientation:   The focus of this group is to educate the patient on the purpose and policies of crisis stabilization and provide a format to answer questions about their admission.  The group details unit policies and expectations of patients while admitted.     Participation Level:  {[facility] PARTICIPATION LEVEL:22264}  Participation Quality:  {[facility] PARTICIPATION QUALITY:22265}  Affect:  {[facility] AFFECT:22266}  Cognitive:  {[facility] COGNITIVE:22267}  Insight: {[facility] Insight2:20797}  Engagement in Group:  {[facility] ENGAGEMENT IN GROUP:22268}  Modes of Intervention:  {[facility] MODES OF INTERVENTION:22269}  Additional Comments:  ***  Plumer Mittelstaedt Lashawn [name] Speas 10/21/2022, 2:09 PM  

## 2022-10-21 NOTE — Progress Notes (Signed)
Adult Psychoeducational Group Note  Date:  10/21/2022 Time:  9:08 PM  Group Topic/Focus:  Wrap-Up Group:   The focus of this group is to help patients review their daily goal of treatment and discuss progress on daily workbooks.  Participation Level:  Active  Participation Quality:  Appropriate and Sharing  Affect:  Appropriate  Cognitive:  Appropriate  Insight: Appropriate  Engagement in Group:  Engaged  Modes of Intervention:  Discussion, Rapport Building, and Support  Additional Comments:   Pt attended and participated in the Wrap Up group. Pt denied SI/HI/AVH and pain. Pt rated her day 7/10. "I'm better than I was yesterday". Pt shared her challenges with her aunt and housing. Writer provided support and encouragement. Pt receptive and expressed gratitude. "I'm proud of myself I didn't react the way I normally do". Pt has not made any progress toward housing.  Pt used coloring, prayer, and talking to friends to improve mood and daily coping. Pt receptive to verbal praise.  Jamie Stevens 10/21/2022, 9:08 PM

## 2022-10-21 NOTE — Progress Notes (Signed)
[facility]  10/21/2022 6:11 PM Jamie Stevens  MRN:  [id number] Subjective:    Jamie Stevens is a 26 year old, African-American female with a past psychiatric history significant for major depressive disorder, ADHD, cannabis use, and past suicide attempts who voluntarily presented [facility] as a walk-in for the assessment of the suicidal ideations with reported recent attempt.  Patient was assessed by Karel JarvisUchenna Rahm Minix, PA-C for reevaluation.  Patient is currently being managed on the following psychiatric medications:  Prozac 20 mg daily Clonidine 0.1 mg at bedtime Hydroxyzine 25 mg 3 times daily as needed  Patient denies experiencing any adverse side effects from her current medication regimen.  Patient states that she was a little calm last night after taking her clonidine.  Patient continues to endorse stressors related to family and her housing situation.  Patient dates that she got into an argument with her aunt about staying at her deceased mother's house.  Patient continues to endorse depression and rates it a 6 out of 10.  Patient endorses anxiety and rates it a 7 out of 10.  Patient denies suicidal or homicidal ideations.  She further denies auditory or visual hallucinations and does not appear to be responding to internal/external stimuli.  Patient denies paranoia or delusional thoughts.  Patient endorses okay sleep and states that she received 5 to 6 hours of sleep last night.  Patient endorses okay appetite.  Patient is laying in bed, alert and oriented x 4.  Patient is calm, cooperative, and fully engaged in conversation during the encounter.  Patient maintains fair eye contact.  Patient's speech is clear, coherent, and with normal rate.  Patient's thought process is organized.  Patient's thought content is clear.  Patient continues to endorse depression and anxiety with congruent affect.  Patient denies suicidal ideations.  Patient does not appear  to be responding to internal/external stimuli.  Principal Problem: MDD (major depressive disorder), recurrent severe, without psychosis (HCC) Diagnosis: Principal Problem:   MDD (major depressive disorder), recurrent severe, without psychosis (HCC) Active Problems:   Suicidal ideation   PTSD (post-traumatic stress disorder)   Generalized anxiety disorder  Total Time spent with patient: 20 minutes  Past Psychiatric History:  Major depressive disorder Generalized anxiety disorder PTSD  Past Medical History:  Past Medical History:  Diagnosis Date   Asthma    Depression    GERD (gastroesophageal reflux disease)    Obesity    Panic anxiety syndrome    Seasonal allergies    Vision abnormalities    Pt wears glasses    Past Surgical History:  Procedure Laterality Date   ADENOIDECTOMY     TONSILLECTOMY     Family History:  Family History  Problem Relation Age of Onset   Depression Mother    Vision loss Mother    Hyperlipidemia Mother    Diabetes Mother    Cancer Mother    Alcohol abuse Father    Depression Father    Heart disease Father    Cancer Father    Family Psychiatric  History:  Mother - Depression Aunt (maternal) - bipolar disorder  Social History:  Social History   Substance and Sexual Activity  Alcohol Use No     Social History   Substance and Sexual Activity  Drug Use Yes   Types: Marijuana    Social History   Socioeconomic History   Marital status: Single    Spouse name: Not on file   Number of children: 0  Years of education: Not on file   Highest education level: Some college, no degree  Occupational History   Not on file  Tobacco Use   Smoking status: Some Days    Passive exposure: Yes   Smokeless tobacco: Never  Vaping Use   Vaping Use: Never used  Substance and Sexual Activity   Alcohol use: No   Drug use: Yes    Types: Marijuana   Sexual activity: Not Currently    Birth control/protection: None  Other Topics Concern   Not  on file  Social History Narrative   Not on file   Social Determinants of Health   Financial Resource Strain: Not on file  Food Insecurity: Food Insecurity Present (10/19/2022)   Hunger Vital Sign    Worried About Running Out of Food in the Last Year: Often true    Ran Out of Food in the Last Year: Often true  Transportation Needs: Unmet Transportation Needs (10/19/2022)   PRAPARE - Administrator, Civil Service (Medical): Yes    Lack of Transportation (Non-Medical): Yes  Physical Activity: Not on file  Stress: Not on file  Social Connections: Not on file   Additional Social History:  See H&P  Sleep: Fair  Appetite:  Fair  Current Medications: Current Facility-Administered Medications  Medication Dose Route Frequency Provider Last Rate Last Admin   acetaminophen (TYLENOL) tablet 650 mg  650 mg Oral Q6H PRN Leevy-Johnson, Brooke A, NP       alum & mag hydroxide-simeth (MAALOX/MYLANTA) 200-200-20 MG/5ML suspension 30 mL  30 mL Oral Q4H PRN Leevy-Johnson, Brooke A, NP       cloNIDine (CATAPRES) tablet 0.1 mg  0.1 mg Oral QHS Jamesa Tedrick E, PA   0.1 mg at 10/20/22 2059   FLUoxetine (PROZAC) capsule 20 mg  20 mg Oral Daily Dale Ribeiro E, PA   20 mg at 10/21/22 2542   hydrOXYzine (ATARAX) tablet 25 mg  25 mg Oral TID PRN Bonnie Overdorf E, PA       magnesium hydroxide (MILK OF MAGNESIA) suspension 30 mL  30 mL Oral Daily PRN Leevy-Johnson, Brooke A, NP        Lab Results:  Results for orders placed or performed during the hospital encounter of 10/19/22 (from the past 48 hour(s))  Comprehensive metabolic panel     Status: Abnormal   Collection Time: 10/20/22  6:35 AM  Result Value Ref Range   Sodium 139 135 - 145 mmol/L   Potassium 3.5 3.5 - 5.1 mmol/L   Chloride 106 98 - 111 mmol/L   CO2 25 22 - 32 mmol/L   Glucose, Bld 117 (H) 70 - 99 mg/dL    Comment: Glucose reference range applies only to samples taken after fasting for at least 8 hours.   BUN 8 6 - 20  mg/dL   Creatinine, Ser 7.06 0.44 - 1.00 mg/dL   Calcium 9.1 8.9 - 23.7 mg/dL   Total Protein 7.8 6.5 - 8.1 g/dL   Albumin 3.7 3.5 - 5.0 g/dL   AST 17 15 - 41 U/L   ALT 17 0 - 44 U/L   Alkaline Phosphatase 53 38 - 126 U/L   Total Bilirubin 0.7 0.3 - 1.2 mg/dL   GFR, Estimated >62 >83 mL/min    Comment: (NOTE) Calculated using the CKD-EPI Creatinine Equation (2021)    Anion gap 8 5 - 15    Comment: Performed at [facility], [address], [place], [place] [address]  CBC     Status: Abnormal   Collection Time: 10/20/22  6:35 AM  Result Value Ref Range   WBC 6.3 4.0 - 10.5 K/uL   RBC 4.21 3.87 - 5.11 MIL/uL   Hemoglobin 11.5 (L) 12.0 - 15.0 g/dL   HCT 02.7 25.3 - 66.4 %   MCV 86.0 80.0 - 100.0 fL   MCH 27.3 26.0 - 34.0 pg   MCHC 31.8 30.0 - 36.0 g/dL   RDW 40.3 47.4 - 25.9 %   Platelets 219 150 - 400 K/uL   nRBC 0.0 0.0 - 0.2 %    Comment: Performed at [facility], [address] [place], [place] [address]  Lipid panel     Status: Abnormal   Collection Time: 10/20/22  6:35 AM  Result Value Ref Range   Cholesterol 185 0 - 200 mg/dL   Triglycerides 94 <564 mg/dL   HDL 50 >33 mg/dL   Total CHOL/HDL Ratio 3.7 RATIO   VLDL 19 0 - 40 mg/dL   LDL Cholesterol 295 (H) 0 - 99 mg/dL    Comment:        Total Cholesterol/HDL:CHD Risk Coronary Heart Disease Risk Table                     Men   Women  1/2 Average Risk   3.4   3.3  Average Risk       5.0   4.4  2 X Average Risk   9.6   7.1  3 X Average Risk  23.4   11.0        Use the calculated Patient Ratio above and the CHD Risk Table to determine the patient's CHD Risk.        ATP III CLASSIFICATION (LDL):  <100     mg/dL   Optimal  188-416  mg/dL   Near or Above                    Optimal  130-159  mg/dL   Borderline  606-301  mg/dL   High  >601     mg/dL   Very High Performed at [facility], [address], [place], [place] [address]   TSH     Status: None    Collection Time: 10/20/22  6:35 AM  Result Value Ref Range   TSH 2.026 0.350 - 4.500 uIU/mL    Comment: Performed by a 3rd Generation assay with a functional sensitivity of <=0.01 uIU/mL. Performed at [facility], [address], [place], [place] [address]   Urinalysis, Routine w reflex microscopic Urine, Clean Catch     Status: Abnormal   Collection Time: 10/20/22  6:19 PM  Result Value Ref Range   Color, Urine YELLOW YELLOW   APPearance HAZY (A) CLEAR   Specific Gravity, Urine 1.018 1.005 - 1.030   pH 6.0 5.0 - 8.0   Glucose, UA NEGATIVE NEGATIVE mg/dL   Hgb urine dipstick LARGE (A) NEGATIVE   Bilirubin Urine NEGATIVE NEGATIVE   Ketones, ur NEGATIVE NEGATIVE mg/dL   Protein, ur 30 (A) NEGATIVE mg/dL   Nitrite NEGATIVE NEGATIVE   Leukocytes,Ua NEGATIVE NEGATIVE   RBC / HPF >50 (H) 0 - 5 RBC/hpf   Bacteria, UA NONE SEEN NONE SEEN   Squamous Epithelial / LPF 0-5 0 - 5   Mucus PRESENT     Comment: Performed at [facility], [address], [place], [place] [address]  Pregnancy, urine     Status: None   Collection Time: 10/20/22  6:19 PM  Result Value Ref Range   Preg Test, Ur NEGATIVE NEGATIVE    Comment:        THE SENSITIVITY OF THIS METHODOLOGY IS >20 mIU/mL. Performed at [facility], [address] [place], [place] [address]   Rapid urine drug screen (hospital performed) not at [facility]     Status: Abnormal   Collection Time: 10/20/22  6:19 PM  Result Value Ref Range   Opiates NONE DETECTED NONE DETECTED   Cocaine NONE DETECTED NONE DETECTED   Benzodiazepines NONE DETECTED NONE DETECTED   Amphetamines NONE DETECTED NONE DETECTED   Tetrahydrocannabinol POSITIVE (A) NONE DETECTED   Barbiturates NONE DETECTED NONE DETECTED    Comment: (NOTE) DRUG SCREEN FOR MEDICAL PURPOSES ONLY.  IF CONFIRMATION IS NEEDED FOR ANY PURPOSE, NOTIFY LAB WITHIN 5 DAYS.  LOWEST DETECTABLE LIMITS FOR URINE DRUG SCREEN Drug Class                      Cutoff (ng/mL) Amphetamine and metabolites    1000 Barbiturate and metabolites    200 Benzodiazepine                 200 Opiates and metabolites        300 Cocaine and metabolites        300 THC                            50 Performed at [facility], [address] [place], [place] [address]   SARS Coronavirus 2 by RT PCR (hospital order, performed in [facility] hospital lab) *cepheid single result test* Anterior Nasal Swab     Status: None   Collection Time: 10/21/22  5:37 AM   Specimen: Anterior Nasal Swab  Result Value Ref Range   SARS Coronavirus 2 by RT PCR NEGATIVE NEGATIVE    Comment: (NOTE) SARS-CoV-2 target nucleic acids are NOT DETECTED.  The SARS-CoV-2 RNA is generally detectable in upper and lower respiratory specimens during the acute phase of infection. The lowest concentration of SARS-CoV-2 viral copies this assay can detect is 250 copies / mL. A negative result does not preclude SARS-CoV-2 infection and should not be used as the sole basis for treatment or other patient management decisions.  A negative result may occur with improper specimen collection / handling, submission of specimen other than nasopharyngeal swab, presence of viral mutation(s) within the areas targeted by this assay, and inadequate number of viral copies (<250 copies / mL). A negative result must be combined with clinical observations, patient history, and epidemiological information.  Fact Sheet for Patients:   RoadLapTop.co.za  Fact Sheet for Healthcare Providers: http://kim-miller.com/  This test is not yet approved or  cleared by the [place] FDA and has been authorized for detection and/or diagnosis of SARS-CoV-2 by FDA under an Emergency Use Authorization (EUA).  This EUA will remain in effect (meaning this test can be used) for the duration of the COVID-19 declaration under Section 564(b)(1) of the  Act, 21 U.S.C. section 360bbb-3(b)(1), unless the authorization is terminated or revoked sooner.  Performed at [facility], [address] [place], [place] [address]     Blood Alcohol level:  Lab Results  Component Value Date   [facility] <10 03/06/2022    Metabolic Disorder Labs: Lab Results  Component Value Date  HGBA1C 5.1 03/06/2022   MPG 99.67 03/06/2022   No results found for: "PROLACTIN" Lab Results  Component Value Date   CHOL 185 10/20/2022   TRIG 94 10/20/2022   HDL 50 10/20/2022   CHOLHDL 3.7 10/20/2022   VLDL 19 10/20/2022   LDLCALC 116 (H) 10/20/2022    Physical Findings: AIMS:  , ,  ,  ,    CIWA:    COWS:     Musculoskeletal: Strength & Muscle Tone: within normal limits Gait & Station: normal Patient leans: N/A  Psychiatric Specialty Exam:  Presentation  General Appearance:  Appropriate for Environment; Casual; Fairly Groomed  Eye Contact: Good  Speech: Clear and Coherent; Normal Rate  Speech Volume: Normal  Handedness: Ambidextrous   Mood and Affect  Mood: Anxious; Depressed  Affect: Congruent   Thought Process  Thought Processes: Coherent; Goal Directed; Linear  Descriptions of Associations:Intact  Orientation:Full (Time, Place and Person)  Thought Content:Logical  History of Schizophrenia/Schizoaffective disorder:No  Duration of Psychotic Symptoms:No data recorded Hallucinations:Hallucinations: None  Ideas of Reference:None  Suicidal Thoughts:Suicidal Thoughts: No SI Passive Intent and/or Plan: Without Intent; Without Plan; Without Means to Carry Out  Homicidal Thoughts:Homicidal Thoughts: No   Sensorium  Memory: Immediate Good; Recent Good; Remote Good  Judgment: Fair  Insight: Fair   Art therapist  Concentration: Good  Attention Span: Good  Recall: Good  Fund of Knowledge: Fair  Language: Good   Psychomotor Activity  Psychomotor Activity: Psychomotor  Activity: Normal   Assets  Assets: Communication Skills; Desire for Improvement; Social Support   Sleep  Sleep: Sleep: Fair    Physical Exam: Physical Exam Constitutional:      Appearance: Normal appearance.  HENT:     Head: Normocephalic and atraumatic.     Nose: Nose normal.     Mouth/Throat:     Mouth: Mucous membranes are moist.  Eyes:     Extraocular Movements: Extraocular movements intact.     Pupils: Pupils are equal, round, and reactive to light.  Cardiovascular:     Rate and Rhythm: Normal rate and regular rhythm.  Pulmonary:     Effort: Pulmonary effort is normal.     Breath sounds: Normal breath sounds.  Abdominal:     General: Abdomen is flat.  Musculoskeletal:        General: Normal range of motion.     Cervical back: Normal range of motion and neck supple.  Skin:    General: Skin is warm and dry.  Neurological:     General: No focal deficit present.     Mental Status: She is alert and oriented to person, place, and time.  Psychiatric:        Attention and Perception: Attention and perception normal. She does not perceive auditory or visual hallucinations.        Mood and Affect: Mood is anxious and depressed. Affect is blunt.        Speech: Speech normal.        Behavior: Behavior normal. Behavior is cooperative.        Thought Content: Thought content normal. Thought content is not paranoid or delusional. Thought content does not include homicidal or suicidal ideation.        Cognition and Memory: Cognition and memory normal.        Judgment: Judgment normal.    Review of Systems  Constitutional: Negative.   HENT: Negative.    Eyes: Negative.   Respiratory: Negative.    Cardiovascular: Negative.   Gastrointestinal:  Negative.   Skin: Negative.   Neurological: Negative.   Psychiatric/Behavioral:  Positive for depression and substance abuse. Negative for hallucinations and suicidal ideas. The patient is nervous/anxious. The patient does not have  insomnia.    Blood pressure 123/82, pulse 64, temperature 98.7 F (37.1 C), temperature source Oral, resp. rate 18, height 5\' 7"  (1.702 m), weight 136 kg, SpO2 100 %, unknown if currently breastfeeding. Body mass index is 46.96 kg/m.   Treatment Plan Summary:  Daily contact with patient to assess and evaluate symptoms and progress in treatment and Medication management  Plan:  Patient continues to endorse depression and anxiety.  Patient is denying suicidal ideations at this time.  Patient has restarted her current medication regimen and will continue to take her medications as prescribed.  Will continue to monitor patient's response to medication regimen.  Safety and Monitoring: Voluntary admission to inpatient psychiatric unit for safety, stabilization and treatment Daily contact with patient to assess and evaluate symptoms and progress in treatment Patient's case to be discussed in multi-disciplinary team meeting Observation Level : q15 minute checks Vital signs: q12 hours Precautions: safety  Diagnosis: Principal Problem:   MDD (major depressive disorder), recurrent severe, without psychosis (HCC) Active Problems:   Suicidal ideation   PTSD (post-traumatic stress disorder)   Generalized anxiety disorder  #Major depressive disorder, recurrent severe, without psychosis -Continue Prozac 20 mg daily for depression  #Generalized anxiety disorder -Continue Prozac 20 mg daily for anxiety -Continue clonidine 0.1 mg at bedtime for anxiety -Continue hydroxyzine 25 mg 3 times daily as needed for anxiety  #PTSD -Continue Prozac 20 mg daily for PTSD -Continue clonidine 0.1 mg at bedtime for PTSD  Discharge Planning: Social work and case management to assist with discharge planning and identification of hospital follow-up needs prior to discharge Estimated LOS: 5-7 days Discharge Concerns: Need to establish a safety plan; Medication compliance and effectiveness Discharge Goals:  Return home with outpatient referrals for mental health follow-up including medication management/psychotherapy   I certify that inpatient services furnished can reasonably be expected to improve the patient's condition.     , PA 10/21/2022, 6:11 PM

## 2022-10-21 NOTE — Progress Notes (Signed)
   10/21/22 0515  15 Minute Checks  Location Bedroom  Visual Appearance Calm  Behavior Sleeping  Sleep (Behavioral Health Patients Only)  Calculate sleep? (Click Yes once per 24 hr at 0600 safety check) Yes  Documented sleep last 24 hours 8.75

## 2022-10-21 NOTE — Group Note (Signed)
Date:  10/21/2022 Time:  2:01 PM  Group Topic/Focus:  Goals Group:   The focus of this group is to help patients establish daily goals to achieve during treatment and discuss how the patient can incorporate goal setting into their daily lives to aide in recovery.    Participation Level:  Did Not Attend  Participation Quality:    Affect:    Cognitive:    Insight:   Engagement in Group:    Modes of Intervention:    Additional Comments:    Jaquita Rector 10/21/2022, 2:01 PM

## 2022-10-21 NOTE — Group Note (Signed)
[facility] LCSW Group Therapy Note  Date/Time:  10/21/2022   Type of Therapy and Topic:  Group could not be held today.  A handout to read on the topic of developing a healthy support system was given to each patient.  They were encouraged to discuss this with their peers.  Handout:  A handout entitled "Developing Your Support System" was provided.  This handout discusses the benefits of a social support system, how to sustain current relationships, some ideas for building a social support system, and why it is important to cultivate your social support system now.  Therapeutic Modalities:   Handout  Avaree Gilberti Grossman-Orr, LCSW        

## 2022-10-21 NOTE — Plan of Care (Signed)
  Problem: Education: Goal: Knowledge of [place] General Education information/materials will improve Outcome: Progressing Goal: Emotional status will improve Outcome: Progressing Goal: Mental status will improve Outcome: Progressing Goal: Verbalization of understanding the information provided will improve Outcome: Progressing   Problem: Activity: Goal: Interest or engagement in activities will improve Outcome: Progressing Goal: Sleeping patterns will improve Outcome: Progressing   Problem: Coping: Goal: Ability to verbalize frustrations and anger appropriately will improve Outcome: Progressing Goal: Ability to demonstrate self-control will improve Outcome: Progressing   Problem: Health Behavior/Discharge Planning: Goal: Identification of resources available to assist in meeting health care needs will improve Outcome: Progressing Goal: Compliance with treatment plan for underlying cause of condition will improve Outcome: Progressing   Problem: Physical Regulation: Goal: Ability to maintain clinical measurements within normal limits will improve Outcome: Progressing   Problem: Safety: Goal: Periods of time without injury will increase Outcome: Progressing   Problem: Education: Goal: Ability to make informed decisions regarding treatment will improve Outcome: Progressing   Problem: Health Behavior/Discharge Planning: Goal: Identification of resources available to assist in meeting health care needs will improve Outcome: Progressing   Problem: Medication: Goal: Compliance with prescribed medication regimen will improve Outcome: Progressing   Problem: Self-Concept: Goal: Ability to disclose and discuss suicidal ideas will improve Outcome: Progressing Goal: Will verbalize positive feelings about self Outcome: Progressing   Problem: Education: Goal: Ability to state activities that reduce stress will improve Outcome: Progressing   Problem: Coping: Goal:  Ability to identify and develop effective coping behavior will improve Outcome: Progressing   Problem: Self-Concept: Goal: Ability to identify factors that promote anxiety will improve Outcome: Progressing Goal: Level of anxiety will decrease Outcome: Progressing Goal: Ability to modify response to factors that promote anxiety will improve Outcome: Progressing   Problem: Education: Goal: Utilization of techniques to improve thought processes will improve Outcome: Progressing Goal: Knowledge of the prescribed therapeutic regimen will improve Outcome: Progressing   Problem: Activity: Goal: Interest or engagement in leisure activities will improve Outcome: Progressing Goal: Imbalance in normal sleep/wake cycle will improve Outcome: Progressing   Problem: Coping: Goal: Coping ability will improve Outcome: Progressing Goal: Will verbalize feelings Outcome: Progressing   Problem: Health Behavior/Discharge Planning: Goal: Ability to make decisions will improve Outcome: Progressing Goal: Compliance with therapeutic regimen will improve Outcome: Progressing   Problem: Role Relationship: Goal: Will demonstrate positive changes in social behaviors and relationships Outcome: Progressing   Problem: Safety: Goal: Ability to disclose and discuss suicidal ideas will improve Outcome: Progressing Goal: Ability to identify and utilize support systems that promote safety will improve Outcome: Progressing

## 2022-10-22 ENCOUNTER — Encounter (HOSPITAL_COMMUNITY): Payer: Self-pay

## 2022-10-22 DIAGNOSIS — F332 Major depressive disorder, recurrent severe without psychotic features: Secondary | ICD-10-CM | POA: Diagnosis not present

## 2022-10-22 NOTE — [facility] Group Notes (Signed)
Patient attended the AA group. 

## 2022-10-22 NOTE — [facility] Group Notes (Signed)
Patient attended goals group. She shared that her goal is "to cope with things that I can not change".

## 2022-10-22 NOTE — Group Note (Signed)
Recreation Therapy Group Note   Group Topic:Problem Solving  Group Date: 10/22/2022 Start Time: 0930 End Time: 1000 Facilitators: Brie Eppard-McCall, LRT,CTRS Location: [address] Dayroom   Goal Area(s) Addresses:  Patient will effectively work in a team with other group members. Patient will verbalize importance of using appropriate problem solving techniques.  Patient will identify positive change associated with effective problem solving skills.    Group Description: Christmas Trivia.  Patients were divided into two teams.  LRT conducted three rounds of trivia with the patients.  The value of each question, was written beside each line were the answer was to be written.  After each round, patients will count of their points and write them at the bottom of the sheet.  At the end of the game, patients will add the totals of all three rounds together.  The team with the most points, wins the game.     Affect/Mood: N/A   Participation Level: Did not attend    Clinical Observations/Individualized Feedback:     Plan: Continue to engage patient in RT group sessions 2-3x/week.   Ava Deguire-McCall, LRT,CTRS 10/22/2022 11:45 AM

## 2022-10-22 NOTE — Progress Notes (Signed)
     10/22/22 2116  Psych Admission Type (Psych Patients Only)  Admission Status Voluntary  Psychosocial Assessment  Patient Complaints Anxiety;Depression;Sadness  Eye Contact Fair  Facial Expression Sad  Affect Anxious  Speech Logical/coherent  Interaction Assertive  Motor Activity Other (Comment) (WDL)  Appearance/Hygiene Unremarkable  Behavior Characteristics Appropriate to situation  Mood Sad;Anxious  Thought Process  Coherency WDL  Content WDL  Delusions None reported or observed  Perception WDL  Hallucination None reported or observed  Judgment Poor  Confusion None  Danger to Self  Current suicidal ideation? Denies  Agreement Not to Harm Self Yes  Description of Agreement verbal  Danger to Others  Danger to Others None reported or observed

## 2022-10-22 NOTE — Progress Notes (Signed)
D:  Patient denied SI and HI, contracts for safety.  Denied A/V hallucinations.   A:  Medications administered per MD orders.  Emotional support and encouragement given patient. R:  Safety maintained with 15 minute checks.  

## 2022-10-22 NOTE — Progress Notes (Signed)
   10/22/22 0539  Sleep  Number of Hours 7

## 2022-10-22 NOTE — Plan of Care (Signed)
  Problem: Activity: Goal: Interest or engagement in activities will improve Outcome: Progressing   Problem: Coping: Goal: Ability to demonstrate self-control will improve Outcome: Progressing   Problem: Medication: Goal: Compliance with prescribed medication regimen will improve Outcome: Progressing   

## 2022-10-22 NOTE — Progress Notes (Signed)
[facility] MD Progress Note  10/22/2022 8:51 AM [name]  MRN:  [id number]  Reason for admission: Jamie Stevens is a 26 year old, African-American female with a past psychiatric history significant for major depressive disorder, ADHD, cannabis use, and past suicide attempts who voluntarily presented to [facility] as a walk-in for the assessment of the suicidal ideations with reported recent attempt.  Daily notes: Ernesha reports, "I'm here because I attempted to commit suicide by hanging, but was not successful. I have been going through some difficult time with school & housing. It was kind of traumatic to me. I'm sort of feeling okay today, but still grieving for the deaths of my parents 4 years ago. I'm also mad at them because they left me in this cruel world without any support system. My mood today feels indifferent. That means, I'm not sad or happy. I slept okay last night. I have re-started my medicines which I have not taken since May of this year due to financial problems. I'm also battling to get back to school to complete my degree in sociology. I have been asked by the school to not return back to school until one year (academic probation). This is not good because I have less than a semester to graduate. Besides, my housing is attached to my school & If I'm not in school, I will lose my housing privileges. I have written some emails to the school officials so they can look into this".  Patient reports taking her medications, denies any side effects. She currently denies any SIHI, AVH, delusional thoughts or paranoia. She does not appear to be responding to any internal stimuli. She says she is attending & participating in the group sessions. There are no changes made on the current plan of care, will continue as already in progress. Reviewed labs, no changes. Will repeat u/a.  Principal Problem: MDD (major depressive disorder), recurrent severe, without psychosis  (HCC) Diagnosis: Principal Problem:   MDD (major depressive disorder), recurrent severe, without psychosis (HCC) Active Problems:   Suicidal ideation   PTSD (post-traumatic stress disorder)   Generalized anxiety disorder  Total Time spent with patient: 20 minutes  Past Psychiatric History:  Major depressive disorder Generalized anxiety disorder PTSD  Past Medical History:  Past Medical History:  Diagnosis Date   Asthma    Depression    GERD (gastroesophageal reflux disease)    Obesity    Panic anxiety syndrome    Seasonal allergies    Vision abnormalities    Pt wears glasses    Past Surgical History:  Procedure Laterality Date   ADENOIDECTOMY     TONSILLECTOMY     Family History:  Family History  Problem Relation Age of Onset   Depression Mother    Vision loss Mother    Hyperlipidemia Mother    Diabetes Mother    Cancer Mother    Alcohol abuse Father    Depression Father    Heart disease Father    Cancer Father    Family Psychiatric  History:  Mother - Depression Aunt (maternal) - bipolar disorder  Social History:  Social History   Substance and Sexual Activity  Alcohol Use No     Social History   Substance and Sexual Activity  Drug Use Yes   Types: Marijuana    Social History   Socioeconomic History   Marital status: Single    Spouse name: Not on file   Number of children: 0   Years of  education: Not on file   Highest education level: Some college, no degree  Occupational History   Not on file  Tobacco Use   Smoking status: Some Days    Passive exposure: Yes   Smokeless tobacco: Never  Vaping Use   Vaping Use: Never used  Substance and Sexual Activity   Alcohol use: No   Drug use: Yes    Types: Marijuana   Sexual activity: Not Currently    Birth control/protection: None  Other Topics Concern   Not on file  Social History Narrative   Not on file   Social Determinants of Health   Financial Resource Strain: Not on file  Food  Insecurity: Food Insecurity Present (10/19/2022)   Hunger Vital Sign    Worried About Running Out of Food in the Last Year: Often true    Ran Out of Food in the Last Year: Often true  Transportation Needs: Unmet Transportation Needs (10/19/2022)   PRAPARE - Administrator, Civil Service (Medical): Yes    Lack of Transportation (Non-Medical): Yes  Physical Activity: Not on file  Stress: Not on file  Social Connections: Not on file   Additional Social History:  See H&P  Sleep: Fair  Appetite:  Fair  Current Medications: Current Facility-Administered Medications  Medication Dose Route Frequency Provider Last Rate Last Admin   acetaminophen (TYLENOL) tablet 650 mg  650 mg Oral Q6H PRN Leevy-Johnson, Brooke A, NP       alum & mag hydroxide-simeth (MAALOX/MYLANTA) 200-200-20 MG/5ML suspension 30 mL  30 mL Oral Q4H PRN Leevy-Johnson, Brooke A, NP       cloNIDine (CATAPRES) tablet 0.1 mg  0.1 mg Oral QHS Mayu Ronk, Uchenna E, PA   0.1 mg at 10/21/22 2150   FLUoxetine (PROZAC) capsule 20 mg  20 mg Oral Daily Xitlalli Newhard, Uchenna E, PA   20 mg at 10/21/22 1443   hydrOXYzine (ATARAX) tablet 25 mg  25 mg Oral TID PRN Karel Jarvis E, PA   25 mg at 10/21/22 2149   magnesium hydroxide (MILK OF MAGNESIA) suspension 30 mL  30 mL Oral Daily PRN Leevy-Johnson, Lina Sar, NP       Lab Results:  Results for orders placed or performed during the hospital encounter of 10/19/22 (from the past 48 hour(s))  Urinalysis, Routine w reflex microscopic Urine, Clean Catch     Status: Abnormal   Collection Time: 10/20/22  6:19 PM  Result Value Ref Range   Color, Urine YELLOW YELLOW   APPearance HAZY (A) CLEAR   Specific Gravity, Urine 1.018 1.005 - 1.030   pH 6.0 5.0 - 8.0   Glucose, UA NEGATIVE NEGATIVE mg/dL   Hgb urine dipstick LARGE (A) NEGATIVE   Bilirubin Urine NEGATIVE NEGATIVE   Ketones, ur NEGATIVE NEGATIVE mg/dL   Protein, ur 30 (A) NEGATIVE mg/dL   Nitrite NEGATIVE NEGATIVE   Leukocytes,Ua  NEGATIVE NEGATIVE   RBC / HPF >50 (H) 0 - 5 RBC/hpf   Bacteria, UA NONE SEEN NONE SEEN   Squamous Epithelial / LPF 0-5 0 - 5   Mucus PRESENT     Comment: Performed at [facility], [address] [place], [place] [address]  Pregnancy, urine     Status: None   Collection Time: 10/20/22  6:19 PM  Result Value Ref Range   Preg Test, Ur NEGATIVE NEGATIVE    Comment:        THE SENSITIVITY OF THIS METHODOLOGY IS >20 mIU/mL. Performed at [facility], [address], [place], [place] [address]   Rapid urine drug screen (hospital performed) not at [facility]     Status: Abnormal   Collection Time: 10/20/22  6:19 PM  Result Value Ref Range   Opiates NONE DETECTED NONE DETECTED   Cocaine NONE DETECTED NONE DETECTED   Benzodiazepines NONE DETECTED NONE DETECTED   Amphetamines NONE DETECTED NONE DETECTED   Tetrahydrocannabinol POSITIVE (A) NONE DETECTED   Barbiturates NONE DETECTED NONE DETECTED    Comment: (NOTE) DRUG SCREEN FOR MEDICAL PURPOSES ONLY.  IF CONFIRMATION IS NEEDED FOR ANY PURPOSE, NOTIFY LAB WITHIN 5 DAYS.  LOWEST DETECTABLE LIMITS FOR URINE DRUG SCREEN Drug Class                     Cutoff (ng/mL) Amphetamine and metabolites    1000 Barbiturate and metabolites    200 Benzodiazepine                 200 Opiates and metabolites        300 Cocaine and metabolites        300 THC                            50 Performed at [facility], [address], [place], [place] [address]   SARS Coronavirus 2 by RT PCR (hospital order, performed in [facility] hospital lab) *cepheid single result test* Anterior Nasal Swab     Status: None   Collection Time: 10/21/22  5:37 AM   Specimen: Anterior Nasal Swab  Result Value Ref Range   SARS Coronavirus 2 by RT PCR NEGATIVE NEGATIVE    Comment: (NOTE) SARS-CoV-2 target nucleic acids are NOT DETECTED.  The SARS-CoV-2 RNA is generally detectable in upper and lower respiratory  specimens during the acute phase of infection. The lowest concentration of SARS-CoV-2 viral copies this assay can detect is 250 copies / mL. A negative result does not preclude SARS-CoV-2 infection and should not be used as the sole basis for treatment or other patient management decisions.  A negative result may occur with improper specimen collection / handling, submission of specimen other than nasopharyngeal swab, presence of viral mutation(s) within the areas targeted by this assay, and inadequate number of viral copies (<250 copies / mL). A negative result must be combined with clinical observations, patient history, and epidemiological information.  Fact Sheet for Patients:   RoadLapTop.co.za  Fact Sheet for Healthcare Providers: http://kim-miller.com/  This test is not yet approved or  cleared by the [place] FDA and has been authorized for detection and/or diagnosis of SARS-CoV-2 by FDA under an Emergency Use Authorization (EUA).  This EUA will remain in effect (meaning this test can be used) for the duration of the COVID-19 declaration under Section 564(b)(1) of the Act, 21 U.S.C. section 360bbb-3(b)(1), unless the authorization is terminated or revoked sooner.  Performed at [facility], [address] [place], [place] [address]    Blood Alcohol level:  Lab Results  Component Value Date   ETH <10 03/06/2022   Metabolic Disorder Labs: Lab Results  Component Value Date   HGBA1C 5.1 03/06/2022   MPG 99.67 03/06/2022   No results found for: "PROLACTIN" Lab Results  Component Value Date   CHOL 185 10/20/2022   TRIG 94 10/20/2022   HDL 50 10/20/2022   CHOLHDL 3.7 10/20/2022   VLDL 19 10/20/2022   LDLCALC 116 (H) 10/20/2022  Physical Findings: AIMS:  , ,  ,  ,    CIWA:    COWS:     Musculoskeletal: Strength & Muscle Tone: within normal limits Gait & Station: normal Patient leans:  N/A  Psychiatric Specialty Exam:  Presentation  General Appearance:  Appropriate for Environment; Casual; Fairly Groomed  Eye Contact: Good  Speech: Clear and Coherent; Normal Rate  Speech Volume: Normal  Handedness: Ambidextrous   Mood and Affect  Mood: Anxious; Depressed  Affect: Congruent   Thought Process  Thought Processes: Coherent; Goal Directed; Linear  Descriptions of Associations:Intact  Orientation:Full (Time, Place and Person)  Thought Content:Logical  History of Schizophrenia/Schizoaffective disorder:No  Duration of Psychotic Symptoms:No data recorded Hallucinations:Hallucinations: None  Ideas of Reference:None  Suicidal Thoughts:Suicidal Thoughts: No SI Passive Intent and/or Plan: Without Intent; Without Plan; Without Means to Carry Out  Homicidal Thoughts:Homicidal Thoughts: No  Sensorium  Memory: Immediate Good; Recent Good; Remote Good  Judgment: Fair  Insight: Fair   Art therapistxecutive Functions  Concentration: Good  Attention Span: Good  Recall: Good  Fund of Knowledge: Fair  Language: Good   Psychomotor Activity  Psychomotor Activity: Psychomotor Activity: Normal   Assets  Assets: Communication Skills; Desire for Improvement; Social Support  Sleep  Sleep: Sleep: Fair  Physical Exam: Physical Exam Constitutional:      Appearance: Normal appearance.  HENT:     Head: Normocephalic and atraumatic.     Nose: Nose normal.     Mouth/Throat:     Mouth: Mucous membranes are moist.  Eyes:     Extraocular Movements: Extraocular movements intact.     Pupils: Pupils are equal, round, and reactive to light.  Cardiovascular:     Rate and Rhythm: Normal rate and regular rhythm.  Pulmonary:     Effort: Pulmonary effort is normal.     Breath sounds: Normal breath sounds.  Abdominal:     General: Abdomen is flat.  Musculoskeletal:        General: Normal range of motion.     Cervical back: Normal range of motion  and neck supple.  Skin:    General: Skin is warm and dry.  Neurological:     General: No focal deficit present.     Mental Status: She is alert and oriented to person, place, and time.  Psychiatric:        Attention and Perception: Attention and perception normal. She does not perceive auditory or visual hallucinations.        Mood and Affect: Mood is anxious and depressed. Affect is blunt.        Speech: Speech normal.        Behavior: Behavior normal. Behavior is cooperative.        Thought Content: Thought content normal. Thought content is not paranoid or delusional. Thought content does not include homicidal or suicidal ideation.        Cognition and Memory: Cognition and memory normal.        Judgment: Judgment normal.    Review of Systems  Constitutional: Negative.   HENT: Negative.    Eyes: Negative.   Respiratory: Negative.    Cardiovascular: Negative.   Gastrointestinal: Negative.   Skin: Negative.   Neurological: Negative.   Psychiatric/Behavioral:  Positive for depression and substance abuse. Negative for hallucinations and suicidal ideas. The patient is nervous/anxious. The patient does not have insomnia.    Blood pressure (!) 110/56, pulse 77, temperature 97.9 F (36.6 C), temperature source Oral, resp. rate 18, height 5\' 7"  (  1.702 m), weight 136 kg, SpO2 100 %, unknown if currently breastfeeding. Body mass index is 46.96 kg/m.  Treatment Plan Summary:  Daily contact with patient to assess and evaluate symptoms and progress in treatment and Medication management.   Continue inpatient hospitalization.  Will continue today 10/22/2022 plan as below except where it is noted.    Diagnosis: Principal Problem:   MDD (major depressive disorder), recurrent severe, without psychosis (HCC) Active Problems:   Suicidal ideation   PTSD (post-traumatic stress disorder)   Generalized anxiety disorder  Plan: #Major depressive disorder, recurrent severe, without  psychosis -Continue Prozac 20 mg po daily.  #Generalized anxiety disorder -Continue Prozac 20 mg daily for anxiety -Continue clonidine 0.1 mg at bedtime for anxiety -Continue hydroxyzine 25 mg 3 times daily as needed for anxiety  #PTSD -Continue Prozac 20 mg daily for PTSD -Continue clonidine 0.1 mg at bedtime for PTSD  Discharge Planning: Social work and case management to assist with discharge planning and identification of hospital follow-up needs prior to discharge Estimated LOS: 5-7 days Discharge Concerns: Need to establish a safety plan; Medication compliance and effectiveness Discharge Goals: Return home with outpatient referrals for mental health follow-up including medication management/psychotherapy   I certify that inpatient services furnished can reasonably be expected to improve the patient's condition.     Armandina Stammer, NP, pmhnp, fnp-bc. 10/22/2022, 8:51 AMPatient ID: Lavone Neri, female   DOB: 07/23/1996, 26 y.o.   MRN: [id number]

## 2022-10-22 NOTE — Plan of Care (Signed)
Nurse discussed anxiety, depression and coping skills with patient.  

## 2022-10-22 NOTE — BH IP Treatment Plan (Signed)
Interdisciplinary Treatment and Diagnostic Plan Update  10/22/2022 Time of Session: 9:40AM  Jamie Stevens MRN: [id number]  Principal Diagnosis: MDD (major depressive disorder), recurrent severe, without psychosis (HCC)  Secondary Diagnoses: Principal Problem:   MDD (major depressive disorder), recurrent severe, without psychosis (HCC) Active Problems:   Suicidal ideation   PTSD (post-traumatic stress disorder)   Generalized anxiety disorder   Current Medications:  Current Facility-Administered Medications  Medication Dose Route Frequency Provider Last Rate Last Admin   acetaminophen (TYLENOL) tablet 650 mg  650 mg Oral Q6H PRN Leevy-Johnson, Brooke A, NP       alum & mag hydroxide-simeth (MAALOX/MYLANTA) 200-200-20 MG/5ML suspension 30 mL  30 mL Oral Q4H PRN Leevy-Johnson, Brooke A, NP       cloNIDine (CATAPRES) tablet 0.1 mg  0.1 mg Oral QHS Nwoko, Uchenna E, PA   0.1 mg at 10/21/22 2150   FLUoxetine (PROZAC) capsule 20 mg  20 mg Oral Daily Nwoko, Uchenna E, PA   20 mg at 10/22/22 5573   hydrOXYzine (ATARAX) tablet 25 mg  25 mg Oral TID PRN Karel Jarvis E, PA   25 mg at 10/21/22 2149   magnesium hydroxide (MILK OF MAGNESIA) suspension 30 mL  30 mL Oral Daily PRN Leevy-Johnson, Brooke A, NP       PTA Medications: Medications Prior to Admission  Medication Sig Dispense Refill Last Dose   albuterol (PROVENTIL HFA;VENTOLIN HFA) 108 (90 Base) MCG/ACT inhaler Inhale 1-2 puffs into the lungs every 4 (four) hours as needed for wheezing or shortness of breath. 3.7 g 0     Patient Stressors: Educational concerns   Financial difficulties    Patient Strengths: Average or above average intelligence  Capable of independent living  [employer] of knowledge   Treatment Modalities: Medication Management, Group therapy, Case management,  1 to 1 session with clinician, Psychoeducation, Recreational therapy.   Physician Treatment Plan for Primary Diagnosis: MDD (major depressive  disorder), recurrent severe, without psychosis (HCC) Long Term Goal(s): Improvement in symptoms so as ready for discharge   Short Term Goals: Ability to identify changes in lifestyle to reduce recurrence of condition will improve Ability to verbalize feelings will improve Ability to disclose and discuss suicidal ideas Ability to demonstrate self-control will improve Ability to identify and develop effective coping behaviors will improve Ability to maintain clinical measurements within normal limits will improve Compliance with prescribed medications will improve Ability to identify triggers associated with substance abuse/mental health issues will improve  Medication Management: Evaluate patient's response, side effects, and tolerance of medication regimen.  Therapeutic Interventions: 1 to 1 sessions, Unit Group sessions and Medication administration.  Evaluation of Outcomes: Not Progressing  Physician Treatment Plan for Secondary Diagnosis: Principal Problem:   MDD (major depressive disorder), recurrent severe, without psychosis (HCC) Active Problems:   Suicidal ideation   PTSD (post-traumatic stress disorder)   Generalized anxiety disorder  Long Term Goal(s): Improvement in symptoms so as ready for discharge   Short Term Goals: Ability to identify changes in lifestyle to reduce recurrence of condition will improve Ability to verbalize feelings will improve Ability to disclose and discuss suicidal ideas Ability to demonstrate self-control will improve Ability to identify and develop effective coping behaviors will improve Ability to maintain clinical measurements within normal limits will improve Compliance with prescribed medications will improve Ability to identify triggers associated with substance abuse/mental health issues will improve     Medication Management: Evaluate patient's response, side effects, and tolerance of medication regimen.  Therapeutic Interventions:  1 to  1 sessions, Unit Group sessions and Medication administration.  Evaluation of Outcomes: Not Progressing   RN Treatment Plan for Primary Diagnosis: MDD (major depressive disorder), recurrent severe, without psychosis (HCC) Long Term Goal(s): Knowledge of disease and therapeutic regimen to maintain health will improve  Short Term Goals: Ability to remain free from injury will improve, Ability to verbalize frustration and anger appropriately will improve, Ability to demonstrate self-control, Ability to participate in decision making will improve, Ability to verbalize feelings will improve, Ability to disclose and discuss suicidal ideas, Ability to identify and develop effective coping behaviors will improve, and Compliance with prescribed medications will improve  Medication Management: RN will administer medications as ordered by provider, will assess and evaluate patient's response and provide education to patient for prescribed medication. RN will report any adverse and/or side effects to prescribing provider.  Therapeutic Interventions: 1 on 1 counseling sessions, Psychoeducation, Medication administration, Evaluate responses to treatment, Monitor vital signs and CBGs as ordered, Perform/monitor CIWA, COWS, AIMS and Fall Risk screenings as ordered, Perform wound care treatments as ordered.  Evaluation of Outcomes: Not Progressing   LCSW Treatment Plan for Primary Diagnosis: MDD (major depressive disorder), recurrent severe, without psychosis (HCC) Long Term Goal(s): Safe transition to appropriate next level of care at discharge, Engage patient in therapeutic group addressing interpersonal concerns.  Short Term Goals: Engage patient in aftercare planning with referrals and resources, Increase social support, Increase ability to appropriately verbalize feelings, Increase emotional regulation, Facilitate acceptance of mental health diagnosis and concerns, Facilitate patient progression through  stages of change regarding substance use diagnoses and concerns, Identify triggers associated with mental health/substance abuse issues, and Increase skills for wellness and recovery  Therapeutic Interventions: Assess for all discharge needs, 1 to 1 time with Social worker, Explore available resources and support systems, Assess for adequacy in community support network, Educate family and significant other(s) on suicide prevention, Complete Psychosocial Assessment, Interpersonal group therapy.  Evaluation of Outcomes: Not Progressing   Progress in Treatment: Attending groups: No. Participating in groups: No. Taking medication as prescribed: Yes. Toleration medication: Yes. Family/Significant other contact made: No, will contact:  friend Beckie Salts [phone] Patient understands diagnosis: Yes. Discussing patient identified problems/goals with staff: Yes. Medical problems stabilized or resolved: Yes. Denies suicidal/homicidal ideation: Yes. Issues/concerns per patient self-inventory: No.   New problem(s) identified: No, Describe:  None Reported   New Short Term/Long Term Goal(s): medication stabilization, elimination of SI thoughts, development of comprehensive mental wellness plan.    Patient Goals: "stay in school, seek therapy services , get a PCP, find assistance with housing and with my fiances"   Discharge Plan or Barriers: Patient recently admitted. CSW will continue to follow and assess for appropriate referrals and possible discharge planning.    Reason for Continuation of Hospitalization: Anxiety Depression Medication stabilization Suicidal ideation  Estimated Length of Stay:3-5 days   Last 3 [place] Suicide Severity Risk Score: Flowsheet Row Admission (Current) [facility] from 03/28/2022 in [facility] Counselor from 03/23/2022 in [facility]  C-SSRS RISK CATEGORY High Risk High Risk High Risk       Last [facility] 2/9 Scores:    03/28/2022    9:44 AM 03/23/2022   10:30 AM 03/06/2022   10:03 PM  Depression screen PHQ 2/9  Decreased Interest 3 2 2   Down, Depressed, Hopeless 2 2 3   PHQ - 2 Score 5 4  5  Altered sleeping 3 3 3   Tired, decreased energy 2 3 3   Change in appetite 3 3 1   Feeling bad or failure about yourself  2 3 2   Trouble concentrating 3 3 1   Moving slowly or fidgety/restless 1 1 0  Suicidal thoughts 1 1 2   PHQ-9 Score 20 21 17   Difficult doing work/chores Extremely dIfficult Very difficult Extremely dIfficult    Scribe for Treatment Team: 10/22/2022 11:49 AM

## 2022-10-23 DIAGNOSIS — F332 Major depressive disorder, recurrent severe without psychotic features: Secondary | ICD-10-CM | POA: Diagnosis not present

## 2022-10-23 MED ORDER — HYDROXYZINE HCL 50 MG PO TABS
50.0000 mg | ORAL_TABLET | Freq: Every evening | ORAL | Status: DC | PRN
  Administered 2022-10-23 – 2022-10-24 (×2): 50 mg via ORAL
  Filled 2022-10-23 (×2): qty 1

## 2022-10-23 MED ORDER — WHITE PETROLATUM EX OINT
TOPICAL_OINTMENT | CUTANEOUS | Status: AC
  Filled 2022-10-23: qty 10

## 2022-10-23 MED ORDER — MELATONIN 5 MG PO TABS
5.0000 mg | ORAL_TABLET | Freq: Every evening | ORAL | Status: DC | PRN
  Administered 2022-10-23 – 2022-10-24 (×2): 5 mg via ORAL
  Filled 2022-10-23 (×2): qty 1

## 2022-10-23 NOTE — Group Note (Signed)
Date:  10/23/2022 Time:  9:35 AM  Group Topic/Focus:  Goals Group:   The focus of this group is to help patients establish daily goals to achieve during treatment and discuss how the patient can incorporate goal setting into their daily lives to aide in recovery. Orientation:   The focus of this group is to educate the patient on the purpose and policies of crisis stabilization and provide a format to answer questions about their admission.  The group details unit policies and expectations of patients while admitted.    Participation Level:  Active  Participation Quality:  Appropriate  Affect:  Appropriate  Cognitive:  Appropriate  Insight: Appropriate  Engagement in Group:  Engaged  Modes of Intervention:  Education  Additional Comments:  PT goal for today `is to use coping skills  Gwinda Maine 10/23/2022, 9:35 AM

## 2022-10-23 NOTE — Progress Notes (Signed)
D- Patient alert and oriented. Denies SI, HI, AVH, and pain. Patient reports improved mood after securing housing with her cousin upon discharge. Patient rates anxiety as 5/10 and depression 4/10. Patient stated that she has been experiencing mild chest tightness throughout the day with a "slight" cough. Patient denies radiating pain and indigestion symptoms. Patient states that she smokes cigarettes and has been diagnosed with asthma in the past. Assessment shows respiratory system to be WDL.  A- PRN melatonin and hydroxyzine 50mg  administered to patient, per MAR. Support and encouragement provided.  Routine safety checks conducted every 15 minutes.  Patient informed to notify staff with problems or concerns.  R- No adverse drug reactions noted. Patient contracts for safety at this time. Patient compliant with medications and treatment plan. Patient receptive, calm, and cooperative. Patient interacts well with others on the unit.  Patient remains safe at this time.

## 2022-10-23 NOTE — Group Note (Signed)
LCSW Group Therapy Note  Group Date: 10/23/2022 Start Time: 1100 End Time: 1200   Type of Therapy and Topic:  Group Therapy - How To Cope with Nervousness about Discharge   Participation Level:  Minimal   Description of Group This process group involved identification of patients' feelings about discharge. Some of them are scheduled to be discharged soon, while others are new admissions, but each of them was asked to share thoughts and feelings surrounding discharge from the hospital. One common theme was that they are excited at the prospect of going home, while another was that many of them are apprehensive about sharing why they were hospitalized. Patients were given the opportunity to discuss these feelings with their peers in preparation for discharge.  Therapeutic Goals  Patient will identify their overall feelings about pending discharge. Patient will think about how they might proactively address issues that they believe will once again arise once they get home (i.e. with parents). Patients will participate in discussion about having hope for change.   Summary of Patient Progress:  Jamie Stevens was Minimally active throughout the session. Jamie Stevens demonstrated very little insight into the subject matter, and proved open to input from peers and feedback from CSW. Jamie Stevens was respectful of peers.   Therapeutic Modalities Cognitive Behavioral Therapy   Ane Payment, LCSW 10/23/2022  1:35 PM

## 2022-10-23 NOTE — Progress Notes (Signed)
   On assessment, patient is alert and oriented.  Patient presents with moderate anxiety and depression, and a sad affect.  Patient is observed interacting with peers in the milieu and attending group.  Patient reports speaking with friends today, but no visitor.  Patient denies SI/HI/AVH.  Patient contracts for safety.  Vitals obtained.  BP 141/86, HR 77.  Administered scheduled medication, Clonidine and PRN Hydroxyzine per [facility] per patient request.  Patient was provided emotional support.  Patient is safe on the unit with Q 15 minute safety checks.

## 2022-10-23 NOTE — BHH Group Notes (Signed)
Adult Psychoeducational Group Note  Date:  10/23/2022 Time:  4:04 PM  Group Topic/Focus:  Healthy Communication:   The focus of this group is to discuss communication, barriers to communication, as well as healthy ways to communicate with others.  Participation Level:  Active  Participation Quality:  Appropriate  Affect:  Appropriate  Cognitive:  Appropriate  Insight: Appropriate  Engagement in Group:  Engaged  Modes of Intervention:  Education  Additional Comments:  PT participated in group   Gwinda Maine 10/23/2022, 4:04 PM

## 2022-10-23 NOTE — Progress Notes (Signed)
D:  Patient's self inventory sheet, patient has good sleep, sleep medication helpful.  Fair appetite, normal energy level, good concentration.  Rated depression 5, hopeless 3, anxiety 7.  Denied withdrawals.  Denied SI.  Denied physical problems.  Denied physical pain  Goal is decrease anxiety.  Plans to use coping skills.  Does have discharge plans. A:  Medications administered per MD orders.  Emotional support and encouragement given patient. R:  Denied SI and HI, contracts for safety.  Denied A/V hallucinations.  Safety maintained with 15 minute checks.  Patient stated she slept 8 hours last night.

## 2022-10-23 NOTE — Plan of Care (Signed)
Nurse discussed anxiety, depression and coping skills with patient.  

## 2022-10-23 NOTE — Plan of Care (Signed)
  Problem: Education: Goal: Emotional status will improve Outcome: Progressing Goal: Mental status will improve Outcome: Progressing   Problem: Health Behavior/Discharge Planning: Goal: Identification of resources available to assist in meeting health care needs will improve Outcome: Progressing Goal: Compliance with treatment plan for underlying cause of condition will improve Outcome: Progressing   Problem: Education: Goal: Ability to make informed decisions regarding treatment will improve Outcome: Progressing

## 2022-10-23 NOTE — Group Note (Signed)
Recreation Therapy Group Note   Group Topic:Animal Assisted Therapy   Group Date: 10/23/2022 Start Time: 1430 End Time: 1505 Facilitators: Krystian Younglove-McCall, LRT,CTRS Location: [address] Dayroom   Animal-Assisted Activity (AAA) Program Checklist/Progress Notes Patient Eligibility Criteria Checklist & Daily Group note for Rec Tx Intervention  AAA/T Program Assumption of Risk Form signed by Patient/ or Parent Legal Guardian Yes  Patient is free of allergies or severe asthma Yes  Patient reports no fear of animals Yes  Patient reports no history of cruelty to animals Yes  Patient understands his/her participation is voluntary Yes  Patient washes hands before animal contact Yes  Patient washes hands after animal contact Yes   Affect/Mood: Appropriate   Participation Level: Engaged   Participation Quality: Independent   Behavior: Appropriate    Clinical Observations/Individualized Feedback:  Patient attended session and interacted appropriately with therapy dog and peers. Patient asked appropriate questions about therapy dog and his training. Patient shared stories about their pets at home with group.    Plan: Continue to engage patient in RT group sessions 2-3x/week.   Austan Nicholl-McCall, LRT,CTRS 10/23/2022 3:21 PM

## 2022-10-23 NOTE — Group Note (Signed)
Date:  10/23/2022 Time:  10:02 AM  Group Topic/Focus:  Goals Group:   The focus of this group is to help patients establish daily goals to achieve during treatment and discuss how the patient can incorporate goal setting into their daily lives to aide in recovery.    Participation Level:  Active  Participation Quality:  Appropriate  Affect:  Appropriate  Cognitive:  Appropriate  Insight: Appropriate  Engagement in Group:  Engaged  Modes of Intervention:  Education  Additional Comments:  PTS goal is to use coping skills  Gwinda Maine 10/23/2022, 10:02 AM

## 2022-10-23 NOTE — Progress Notes (Signed)
[facility] MD Progress Note  10/23/2022 5:53 PM Jamie Stevens  MRN:  [id number]  Subjective:    Jamie Stevens is a 26 year old, African-American female with a past psychiatric history significant for major depressive disorder, ADHD, cannabis use, and past suicide attempts who voluntarily presented to [facility] as a walk-in for the assessment of the suicidal ideations with reported recent attempt.   Patient was assessed by Karel Jarvis, PA-C for reevaluation.  Patient is currently being managed on the following psychiatric medications:   Prozac 20 mg daily Clonidine 0.1 mg at bedtime Hydroxyzine 25 mg 3 times daily as needed  Patient reports that she feels better but still continues to have anxiety regarding issues with her aunt about housing.  Patient rates her depression at 5 out of 10 with 10 being most severe.  Patient rates her anxiety as 6 or 7 out of 10.  Patient states that her anxiety also stems from the unknown related to what she will do following discharge from this facility.  Patient's current stressors include housing, school, and dealing with grief.  Patient reports that she has been experiencing chest pain as well as fluctuations in her blood pressure since being placed on clonidine.   Patient denies suicidal or homicidal ideations.  She further denies auditory or visual hallucinations and does not appear to be responding to internal/external stimuli.  Patient denies paranoia or delusional thoughts.  Patient endorses fair sleep and receives on average 6 to 7 hours of intermittent sleep.  Patient reports that she has taken trazodone in the past but it took her a while to fall asleep when taking the medication.  She also states that the medication made her feel groggy.  Patient endorses okay appetite.  Patient is sitting in bed, casually dressed, and alert and oriented x 4.  Patient is calm, cooperative, and fully engaged in conversation during the encounter.   Patient maintains good eye contact.  Patient's speech is clear, coherent, and with normal rate.  Patient's thought process is organized.  Patient's thought content is clear.  Patient continues to endorse depression and anxiety with congruent affect.  Patient denies suicidal ideations.  Patient does not appear to be responding to internal/external stimuli.  Principal Problem: MDD (major depressive disorder), recurrent severe, without psychosis (HCC) Diagnosis: Principal Problem:   MDD (major depressive disorder), recurrent severe, without psychosis (HCC) Active Problems:   Suicidal ideation   PTSD (post-traumatic stress disorder)   Generalized anxiety disorder  Total Time spent with patient: 25 minutes  Past Psychiatric History:    Past Medical History:  Past Medical History:  Diagnosis Date   Asthma    Depression    GERD (gastroesophageal reflux disease)    Obesity    Panic anxiety syndrome    Seasonal allergies    Vision abnormalities    Pt wears glasses    Past Surgical History:  Procedure Laterality Date   ADENOIDECTOMY     TONSILLECTOMY     Family History:  Family History  Problem Relation Age of Onset   Depression Mother    Vision loss Mother    Hyperlipidemia Mother    Diabetes Mother    Cancer Mother    Alcohol abuse Father    Depression Father    Heart disease Father    Cancer Father    Family Psychiatric  History:  Mother - Depression Aunt (maternal) - bipolar disorder  Social History:  Social History   Substance and Sexual Activity  Alcohol Use No     Social History   Substance and Sexual Activity  Drug Use Yes   Types: Marijuana    Social History   Socioeconomic History   Marital status: Single    Spouse name: Not on file   Number of children: 0   Years of education: Not on file   Highest education level: Some college, no degree  Occupational History   Not on file  Tobacco Use   Smoking status: Some Days    Passive exposure: Yes    Smokeless tobacco: Never  Vaping Use   Vaping Use: Never used  Substance and Sexual Activity   Alcohol use: No   Drug use: Yes    Types: Marijuana   Sexual activity: Not Currently    Birth control/protection: None  Other Topics Concern   Not on file  Social History Narrative   Not on file   Social Determinants of Health   Financial Resource Strain: Not on file  Food Insecurity: Food Insecurity Present (10/19/2022)   Hunger Vital Sign    Worried About Running Out of Food in the Last Year: Often true    Ran Out of Food in the Last Year: Often true  Transportation Needs: Unmet Transportation Needs (10/19/2022)   PRAPARE - Administrator, Civil Service (Medical): Yes    Lack of Transportation (Non-Medical): Yes  Physical Activity: Not on file  Stress: Not on file  Social Connections: Not on file   Additional Social History:  See H&P  Sleep: Fair  Appetite:  Fair  Current Medications: Current Facility-Administered Medications  Medication Dose Route Frequency Provider Last Rate Last Admin   acetaminophen (TYLENOL) tablet 650 mg  650 mg Oral Q6H PRN Leevy-Johnson, Brooke A, NP       alum & mag hydroxide-simeth (MAALOX/MYLANTA) 200-200-20 MG/5ML suspension 30 mL  30 mL Oral Q4H PRN Leevy-Johnson, Brooke A, NP       FLUoxetine (PROZAC) capsule 20 mg  20 mg Oral Daily Jauna Raczynski E, PA   20 mg at 10/23/22 0757   hydrOXYzine (ATARAX) tablet 25 mg  25 mg Oral TID PRN Karel Jarvis E, PA   25 mg at 10/22/22 2116   hydrOXYzine (ATARAX) tablet 50 mg  50 mg Oral QHS PRN Twylia Oka E, PA       magnesium hydroxide (MILK OF MAGNESIA) suspension 30 mL  30 mL Oral Daily PRN Leevy-Johnson, Brooke A, NP       melatonin tablet 5 mg  5 mg Oral QHS PRN Junetta Hearn E, PA       white petrolatum (VASELINE) gel             Lab Results: No results found for this or any previous visit (from the past 48 hour(s)).  Blood Alcohol level:  Lab Results  Component Value Date    ETH <10 03/06/2022    Metabolic Disorder Labs: Lab Results  Component Value Date   HGBA1C 5.1 03/06/2022   MPG 99.67 03/06/2022   No results found for: "PROLACTIN" Lab Results  Component Value Date   CHOL 185 10/20/2022   TRIG 94 10/20/2022   HDL 50 10/20/2022   CHOLHDL 3.7 10/20/2022   VLDL 19 10/20/2022   LDLCALC 116 (H) 10/20/2022    Physical Findings: AIMS:  , ,  ,  ,    CIWA:    COWS:     Musculoskeletal: Strength & Muscle Tone: within normal limits Gait & Station: normal Patient  leans: N/A  Psychiatric Specialty Exam:  Presentation  General Appearance:  Appropriate for Environment; Casual; Fairly Groomed  Eye Contact: Good  Speech: Clear and Coherent; Normal Rate  Speech Volume: Normal  Handedness: Ambidextrous   Mood and Affect  Mood: Anxious; Depressed  Affect: Congruent   Thought Process  Thought Processes: Coherent; Goal Directed  Descriptions of Associations:Intact  Orientation:Full (Time, Place and Person)  Thought Content:Logical  History of Schizophrenia/Schizoaffective disorder:No  Duration of Psychotic Symptoms:No data recorded Hallucinations:Hallucinations: None  Ideas of Reference:None  Suicidal Thoughts:Suicidal Thoughts: No  Homicidal Thoughts:Homicidal Thoughts: No   Sensorium  Memory: Immediate Good; Recent Good; Remote Good  Judgment: Fair  Insight: Fair   Art therapistxecutive Functions  Concentration: Good  Attention Span: Good  Recall: Good  Fund of Knowledge: Fair  Language: Good   Psychomotor Activity  Psychomotor Activity: Psychomotor Activity: Normal   Assets  Assets: Communication Skills; Desire for Improvement; Social Support   Sleep  Sleep: Sleep: Fair    Physical Exam: Physical Exam Constitutional:      Appearance: Normal appearance.  HENT:     Head: Normocephalic and atraumatic.     Nose: Nose normal.     Mouth/Throat:     Mouth: Mucous membranes are moist.  Eyes:      Extraocular Movements: Extraocular movements intact.     Pupils: Pupils are equal, round, and reactive to light.  Cardiovascular:     Rate and Rhythm: Normal rate and regular rhythm.  Pulmonary:     Effort: Pulmonary effort is normal.     Breath sounds: Normal breath sounds.  Abdominal:     General: Abdomen is flat.  Musculoskeletal:     Cervical back: Normal range of motion and neck supple.  Skin:    General: Skin is warm and dry.  Neurological:     General: No focal deficit present.     Mental Status: She is alert and oriented to person, place, and time.  Psychiatric:        Attention and Perception: Attention and perception normal. She does not perceive auditory or visual hallucinations.        Mood and Affect: Mood is anxious and depressed. Affect is blunt.        Speech: Speech normal.        Behavior: Behavior normal. Behavior is cooperative.        Thought Content: Thought content normal. Thought content is not paranoid or delusional. Thought content does not include homicidal or suicidal ideation.        Cognition and Memory: Cognition and memory normal.        Judgment: Judgment normal.    Review of Systems  Constitutional: Negative.   HENT: Negative.    Eyes: Negative.   Respiratory: Negative.    Cardiovascular: Negative.   Gastrointestinal: Negative.   Skin: Negative.   Neurological: Negative.   Psychiatric/Behavioral:  Positive for depression and substance abuse. Negative for hallucinations and suicidal ideas. The patient is nervous/anxious. The patient does not have insomnia.    Blood pressure 132/76, pulse 65, temperature 97.9 F (36.6 C), temperature source Oral, resp. rate 18, height 5\' 7"  (1.702 m), weight 136 kg, SpO2 100 %, unknown if currently breastfeeding. Body mass index is 46.96 kg/m.   Treatment Plan Summary:  Daily contact with patient to assess and evaluate symptoms and progress in treatment and Medication management  Plan:  Although  patient continues to endorse depression, she reports that her mood has improved some.  Patient  continues to endorse anxiety related to plans after discharge as well as her issue with her aunt in regards to her housing situation.  Patient reports that her medications are overall helpful; however, patient reports that she has been experiencing chest pain and fluctuations in her blood pressure since being on clonidine.  Patient to be taken off clonidine and placed on another medication to aid in her sleep management.  Patient to be prescribed hydroxyzine 50 mg at bedtime as needed or melatonin 5 mg at bedtime as needed for the management of her sleep.  Safety and Monitoring: Voluntary admission to inpatient psychiatric unit for safety, stabilization and treatment Daily contact with patient to assess and evaluate symptoms and progress in treatment Patient's case to be discussed in multi-disciplinary team meeting Observation Level : q15 minute checks Vital signs: q12 hours Precautions: safety  Diagnosis: Principal Problem:   MDD (major depressive disorder), recurrent severe, without psychosis (HCC) Active Problems:   Suicidal ideation   PTSD (post-traumatic stress disorder)   Generalized anxiety disorder  #Major depressive disorder, recurrent severe, without psychosis -Continue Prozac 20 mg daily for depression  #Generalized anxiety disorder -Continue Prozac 20 mg daily for anxiety -Continue hydroxyzine 25 mg 3 times daily as needed for anxiety  #PTSD -Continue Prozac 20 mg daily for PTSD  #Insomnia -Start hydroxyzine 50 mg at bedtime as needed for sleep -Start melatonin 5 mg at bedtime as needed for sleep  As needed medications: -Patient to continue taking Tylenol 650 mg every 6 hours as needed for mild pain -Patient to continue taking Maalox/Mylanta 30 mL every 4 hours as needed for indigestion -Patient to continue taking Milk of Magnesia 30 mL as needed for mild  constipation  Discharge Planning: Social work and case management to assist with discharge planning and identification of hospital follow-up needs prior to discharge Estimated LOS: 5-7 days Discharge Concerns: Need to establish a safety plan; Medication compliance and effectiveness Discharge Goals: Return home with outpatient referrals for mental health follow-up including medication management/psychotherapy   I certify that inpatient services furnished can reasonably be expected to improve the patient's condition.    Meta Hatchet, PA 10/23/2022, 5:53 PM

## 2022-10-23 NOTE — [facility] Suicide Risk Assessment (Signed)
[facility] INPATIENT:  Family/Significant Other Suicide Prevention Education  Suicide Prevention Education:  Education Completed; 10-23-2022, friend Jamie Stevens [phone]  has been identified by the patient as the family member/significant other with whom the patient will be residing, and identified as the person(Jamie) who will aid the patient in the event of a mental health crisis (suicidal ideations/suicide attempt).  With written consent from the patient, the family member/significant other has been provided the following suicide prevention education, prior to the and/or following the discharge of the patient.  The suicide prevention education provided includes the following: Suicide risk factors Suicide prevention and interventions National Suicide Hotline telephone number [facility] assessment telephone number [facility] Emergency Assistance 911 [facility] and/or Residential Mobile Crisis Unit telephone number  Request made of family/significant other to: Remove weapons (e.g., guns, rifles, knives), all items previously/currently identified as safety concern.   Remove drugs/medications (over-the-counter, prescriptions, illicit drugs), all items previously/currently identified as a safety concern.  Jamie Cheek) Jamie Stevens [phone]  verbalizes understanding of the suicide prevention education information provided.  The family member/significant other agrees to remove the items of safety concern listed above.  Jamie Stevens Jamie Stevens 10/23/2022, 1:32 PM

## 2022-10-24 DIAGNOSIS — F332 Major depressive disorder, recurrent severe without psychotic features: Secondary | ICD-10-CM | POA: Diagnosis not present

## 2022-10-24 MED ORDER — FERROUS SULFATE 325 (65 FE) MG PO TABS
325.0000 mg | ORAL_TABLET | Freq: Every day | ORAL | Status: DC
  Administered 2022-10-25: 325 mg via ORAL
  Filled 2022-10-24 (×2): qty 1

## 2022-10-24 MED ORDER — WHITE PETROLATUM EX OINT
TOPICAL_OINTMENT | CUTANEOUS | Status: AC
  Administered 2022-10-24: 1
  Filled 2022-10-24: qty 5

## 2022-10-24 NOTE — Progress Notes (Signed)
     10/24/22 2146  Psych Admission Type (Psych Patients Only)  Admission Status Voluntary  Psychosocial Assessment  Patient Complaints None  Eye Contact Fair  Facial Expression Sad  Affect Depressed  Speech Logical/coherent  Interaction Assertive  Motor Activity Other (Comment) (WDL)  Appearance/Hygiene Unremarkable  Behavior Characteristics Cooperative;Appropriate to situation  Mood Pleasant;Depressed  Thought Process  Coherency WDL  Content WDL  Delusions None reported or observed  Perception WDL  Hallucination None reported or observed  Judgment Impaired  Confusion None  Danger to Self  Current suicidal ideation? Denies  Agreement Not to Harm Self Yes  Description of Agreement verbal  Danger to Others  Danger to Others None reported or observed

## 2022-10-24 NOTE — Group Note (Signed)
Date:  10/24/2022 Time:  2:39 PM  Group Topic/Focus:  Self Care:   The focus of this group is to help patients understand the importance of self-care in order to improve or restore emotional, physical, spiritual, interpersonal, and financial health.    Participation Level:  Active  Participation Quality:  Appropriate  Affect:  Appropriate  Cognitive:  Appropriate  Insight: Appropriate  Engagement in Group:  Engaged  Modes of Intervention:  Discussion  Additional Comments:  Pt went over Self-Encouragement handouts.  Jaquita Rector 10/24/2022, 2:39 PM

## 2022-10-24 NOTE — Progress Notes (Signed)
D: Pt denied SI/HI/AVH this morning. Pt rates her depression a 3/10 and her anxiety a 3/10 anxiety. Pt's only complaint is of her chest pain which she states gets worse whenever she moves around. Pt has been calm and cooperative throughout the shift.   A: RN provided support and encouragement to patient. Pt given scheduled medications as prescribed. MD made aware of pt's complaint of chest pain. Q15 min checks verified for safety.    R: Patient verbally contracts for safety. Patient compliant with medications and treatment plan. Pt is safe on the unit.   10/24/22 0941  Psych Admission Type (Psych Patients Only)  Admission Status Voluntary  Psychosocial Assessment  Patient Complaints Other (Comment) (Pt's only complaint is of her chest pain)  Eye Contact Fair  Facial Expression Sad  Affect Depressed  Speech Logical/coherent  Interaction Assertive  Motor Activity Other (Comment) (WDL)  Appearance/Hygiene Unremarkable  Behavior Characteristics Cooperative;Appropriate to situation  Mood Depressed;Sad  Thought Process  Coherency WDL  Content WDL  Delusions None reported or observed  Perception WDL  Hallucination None reported or observed  Judgment Impaired  Confusion None  Danger to Self  Current suicidal ideation? Denies  Self-Injurious Behavior No self-injurious ideation or behavior indicators observed or expressed   Agreement Not to Harm Self Yes  Description of Agreement Pt verbally contracts for safety  Danger to Others  Danger to Others None reported or observed

## 2022-10-24 NOTE — Group Note (Signed)
Recreation Therapy Group Note   Group Topic:Team Building  Group Date: 10/24/2022 Start Time: 0930 End Time: 0950 Facilitators: Draylen Lobue-McCall, LRT,CTRS Location: [address] Dayroom   Goal Area(s) Addresses:  Patient will effectively work with peer towards shared goal.  Patient will identify skills used to make activity successful.  Patient will share challenges and verbalize solution-driven approaches used. Patient will identify how skills used during activity can be used to reach post d/c goals.   Group Description: [employer]. Patients were provided the following materials: 5 drinking straws, 5 rubber bands, 5 paper clips, 2 index cards and 2 drinking cups. Using the provided materials patients were asked to build a launching mechanism to launch a ping pong ball across the room, approximately 10 feet. Patients were divided into teams of 3-5. Instructions required all materials be incorporated into the device, functionality of items left to the peer group's discretion.   Affect/Mood: Appropriate   Participation Level: Engaged   Participation Quality: Independent   Behavior: Appropriate   Speech/Thought Process: Focused   Insight: Good   Judgement: Good   Modes of Intervention: STEM Activity   Patient Response to Interventions:  Engaged   Education Outcome:  Acknowledges education and In group clarification offered    Clinical Observations/Individualized Feedback: Pt was engaged, worked well with peers.  Pt showed some frustration with not being able to figure out how to complete the activity.  Pt was appropriate throughout group.    Plan: Continue to engage patient in RT group sessions 2-3x/week.   Jian Hodgman-McCall, LRT,CTRS 10/24/2022 12:15 PM

## 2022-10-24 NOTE — Progress Notes (Signed)
   10/24/22 0544  15 Minute Checks  Location Bedroom  Visual Appearance Calm  Behavior Sleeping  Sleep (Behavioral Health Patients Only)  Calculate sleep? (Click Yes once per 24 hr at 0600 safety check) Yes  Documented sleep last 24 hours 9.75

## 2022-10-24 NOTE — [facility] Group Notes (Signed)
[facility] Group Notes:  (Nursing/MHT/Case Management/Adjunct)  Date:  10/24/2022  Time:  12:25 AM  Type of Therapy:   wrap up   Participation Level:  Active  Participation Quality:  Appropriate  Affect:  Appropriate  Cognitive:  Alert and Appropriate  Insight:  Appropriate and Good  Engagement in Group:  Engaged  Modes of Intervention:  Discussion  Summary of Progress/Problems: The pt. Shared with the group how she rates her day with a 7.  She was excited to continue and share how she will be discharge with housing.  Annell Greening [place] 10/24/2022, 12:25 AM

## 2022-10-24 NOTE — Progress Notes (Signed)
[facility]  10/24/2022 9:54 AM Jamie Neriikiah A Gerstenberger  MRN:  [id number]  Reason for admission: Jamie Stevens is a 26 year old, African-American female with a past psychiatric history significant for major depressive disorder, ADHD, cannabis use, and past suicide attempts who voluntarily presented [facility] as a walk-in for the assessment of the suicidal ideations with reported recent attempt.   Daily notes: Jamie Levelsikiah is seen, chart reviewed. The chart findings discussed with the treatment team. She presents alert while lying down in bed. She is arousable. She reports improved & the optimism that she does have a place to go to after discharge. She says she reached out to her cousin who has agreed for her to come live with them after discharge. She says she is happy. Jamie Levelsikiah reports,  "Mentally, I'm ok. I just feel run down physically this morning. That is why I'm lying down. I'm sleeping well. Doing well on all my medicines, no side effects. I have attending group sessions. I think I'm feeling ready for discharge. My cousin has agreed for me to come live with them after discharge". Patient currently denies any SIHI, AVH, delusional thoughts or paranoia. She does not appear to be responding to any internal stimuli. There are no changes made on her current plan of care, exception an addition Ferrous sulfate 325 mg po daily for low hgb. Will continue as already in progress. Reviewed labs, will obtain hgba1c.  Principal Problem: MDD (major depressive disorder), recurrent severe, without psychosis (HCC)  Diagnosis: Principal Problem:   MDD (major depressive disorder), recurrent severe, without psychosis (HCC) Active Problems:   Suicidal ideation   PTSD (post-traumatic stress disorder)   Generalized anxiety disorder  Total Time spent with patient: 25 minutes  Past Psychiatric History:    Past Medical History:  Past Medical History:  Diagnosis Date   Asthma     Depression    GERD (gastroesophageal reflux disease)    Obesity    Panic anxiety syndrome    Seasonal allergies    Vision abnormalities    Pt wears glasses    Past Surgical History:  Procedure Laterality Date   ADENOIDECTOMY     TONSILLECTOMY     Family History:  Family History  Problem Relation Age of Onset   Depression Mother    Vision loss Mother    Hyperlipidemia Mother    Diabetes Mother    Cancer Mother    Alcohol abuse Father    Depression Father    Heart disease Father    Cancer Father    Family Psychiatric  History:  Mother - Depression Aunt (maternal) - bipolar disorder  Social History:  Social History   Substance and Sexual Activity  Alcohol Use No     Social History   Substance and Sexual Activity  Drug Use Yes   Types: Marijuana    Social History   Socioeconomic History   Marital status: Single    Spouse name: Not on file   Number of children: 0   Years of education: Not on file   Highest education level: Some college, no degree  Occupational History   Not on file  Tobacco Use   Smoking status: Some Days    Passive exposure: Yes   Smokeless tobacco: Never  Vaping Use   Vaping Use: Never used  Substance and Sexual Activity   Alcohol use: No   Drug use: Yes    Types: Marijuana   Sexual activity: Not Currently  Birth control/protection: None  Other Topics Concern   Not on file  Social History Narrative   Not on file   Social Determinants of Health   Financial Resource Strain: Not on file  Food Insecurity: Food Insecurity Present (10/19/2022)   Hunger Vital Sign    Worried About Running Out of Food in the Last Year: Often true    Ran Out of Food in the Last Year: Often true  Transportation Needs: Unmet Transportation Needs (10/19/2022)   PRAPARE - Administrator, Civil Service (Medical): Yes    Lack of Transportation (Non-Medical): Yes  Physical Activity: Not on file  Stress: Not on file  Social Connections: Not  on file   Additional Social History:  See H&P  Sleep: Fair  Appetite:  Fair  Current Medications: Current Facility-Administered Medications  Medication Dose Route Frequency Provider Last Rate Last Admin   acetaminophen (TYLENOL) tablet 650 mg  650 mg Oral Q6H PRN Leevy-Johnson, Brooke A, NP       alum & mag hydroxide-simeth (MAALOX/MYLANTA) 200-200-20 MG/5ML suspension 30 mL  30 mL Oral Q4H PRN Leevy-Johnson, Brooke A, NP       FLUoxetine (PROZAC) capsule 20 mg  20 mg Oral Daily Anora Schwenke, Uchenna E, PA   20 mg at 10/24/22 0737   hydrOXYzine (ATARAX) tablet 25 mg  25 mg Oral TID PRN Hikari Tripp, Uchenna E, PA   25 mg at 10/22/22 2116   hydrOXYzine (ATARAX) tablet 50 mg  50 mg Oral QHS PRN Tahari Clabaugh, Uchenna E, PA   50 mg at 10/23/22 2042   magnesium hydroxide (MILK OF MAGNESIA) suspension 30 mL  30 mL Oral Daily PRN Leevy-Johnson, Brooke A, NP       melatonin tablet 5 mg  5 mg Oral QHS PRN Jaleal Schliep, Uchenna E, PA   5 mg at 10/23/22 2042    Lab Results: No results found for this or any previous visit (from the past 48 hour(s)).  Blood Alcohol level:  Lab Results  Component Value Date   ETH <10 03/06/2022    Metabolic Disorder Labs: Lab Results  Component Value Date   HGBA1C 5.1 03/06/2022   MPG 99.67 03/06/2022   No results found for: "PROLACTIN" Lab Results  Component Value Date   CHOL 185 10/20/2022   TRIG 94 10/20/2022   HDL 50 10/20/2022   CHOLHDL 3.7 10/20/2022   VLDL 19 10/20/2022   LDLCALC 116 (H) 10/20/2022    Physical Findings: AIMS:  , ,  ,  ,    CIWA:    COWS:     Musculoskeletal: Strength & Muscle Tone: within normal limits Gait & Station: normal Patient leans: N/A  Psychiatric Specialty Exam:  Presentation  General Appearance:  Appropriate for Environment; Casual; Fairly Groomed  Eye Contact: Good  Speech: Clear and Coherent; Normal Rate  Speech Volume: Normal  Handedness: Ambidextrous  Mood and Affect  Mood: Anxious;  Depressed  Affect: Congruent  Thought Process  Thought Processes: Coherent; Goal Directed  Descriptions of Associations:Intact  Orientation:Full (Time, Place and Person)  Thought Content:Logical  History of Schizophrenia/Schizoaffective disorder:No  Duration of Psychotic Symptoms:No data recorded Hallucinations:Hallucinations: None  Ideas of Reference:None  Suicidal Thoughts:Suicidal Thoughts: No  Homicidal Thoughts:Homicidal Thoughts: No  Sensorium  Memory: Immediate Good; Recent Good; Remote Good  Judgment: Fair  Insight: Fair  Art therapist  Concentration: Good  Attention Span: Good  Recall: Good  Fund of Knowledge: Fair  Language: Good  Psychomotor Activity  Psychomotor Activity: Psychomotor Activity: Normal  Assets  Assets: Manufacturing systems engineer; Desire for Improvement; Social Support  Sleep  Sleep: Sleep: Fair  Physical Exam: Physical Exam Constitutional:      Appearance: Normal appearance.  HENT:     Head: Normocephalic and atraumatic.     Nose: Nose normal.     Mouth/Throat:     Mouth: Mucous membranes are moist.  Eyes:     Extraocular Movements: Extraocular movements intact.     Pupils: Pupils are equal, round, and reactive to light.  Cardiovascular:     Rate and Rhythm: Normal rate and regular rhythm.  Pulmonary:     Effort: Pulmonary effort is normal.     Breath sounds: Normal breath sounds.  Abdominal:     General: Abdomen is flat.  Genitourinary:    Comments: Deferred Musculoskeletal:        General: Normal range of motion.     Cervical back: Normal range of motion and neck supple.  Skin:    General: Skin is warm and dry.  Neurological:     General: No focal deficit present.     Mental Status: She is alert and oriented to person, place, and time.  Psychiatric:        Attention and Perception: Attention and perception normal. She does not perceive auditory or visual hallucinations.        Mood and Affect:  Mood is anxious and depressed. Affect is blunt.        Speech: Speech normal.        Behavior: Behavior normal. Behavior is cooperative.        Thought Content: Thought content normal. Thought content is not paranoid or delusional. Thought content does not include homicidal or suicidal ideation.        Cognition and Memory: Cognition and memory normal.        Judgment: Judgment normal.    Review of Systems  Constitutional: Negative.   HENT: Negative.    Eyes: Negative.   Respiratory: Negative.    Cardiovascular: Negative.   Gastrointestinal: Negative.   Skin: Negative.   Neurological: Negative.   Psychiatric/Behavioral:  Positive for depression ("improving") and substance abuse. Negative for hallucinations and suicidal ideas. The patient is nervous/anxious. The patient does not have insomnia.    Blood pressure 132/87, pulse 76, temperature 98.9 F (37.2 C), temperature source Oral, resp. rate 16, height 5\' 7"  (1.702 m), weight 136 kg, SpO2 99 %, unknown if currently breastfeeding. Body mass index is 46.96 kg/m.  Treatment Plan Summary:  Daily contact with patient to assess and evaluate symptoms and progress in treatment and Medication management.   Continue inpatient hospitalization.  Will continue today 10/24/2022 plan as below except where it is noted.   Diagnosis: Principal Problem:   MDD (major depressive disorder), recurrent severe, without psychosis (HCC) Active Problems:   Suicidal ideation   PTSD (post-traumatic stress disorder)   Generalized anxiety disorder.   Plan: #Major depressive disorder, recurrent severe, without psychosis -Continue Prozac 20 mg daily for depression  #Generalized anxiety disorder -Continue Prozac 20 mg daily for anxiety -Continue hydroxyzine 25 mg 3 times daily as needed for anxiety  #PTSD -Continue Prozac 20 mg daily for PTSD  #Insomnia -Continue hydroxyzine 50 mg at bedtime as needed for sleep -Continue melatonin 5 mg at bedtime as  needed for sleep.   As needed medications: -Patient to continue taking Tylenol 650 mg every 6 hours as needed for mild pain -Patient to continue taking Maalox/Mylanta 30 mL every 4 hours as needed for  indigestion -Patient to continue taking Milk of Magnesia 30 mL as needed for mild constipation  Safety and Monitoring: Voluntary admission to inpatient psychiatric unit for safety, stabilization and treatment Daily contact with patient to assess and evaluate symptoms and progress in treatment Patient's case to be discussed in multi-disciplinary team meeting Observation Level : q15 minute checks Vital signs: q12 hours Precautions: safety  Discharge Planning: Social work and case management to assist with discharge planning and identification of hospital follow-up needs prior to discharge Estimated LOS: 5-7 days Discharge Concerns: Need to establish a safety plan; Medication compliance and effectiveness Discharge Goals: Return home with outpatient referrals for mental health follow-up including medication management/psychotherapy   I certify that inpatient services furnished can reasonably be expected to improve the patient's condition.    Armandina Stammer, NP, pmhnp, fnp-bc. 10/24/2022, 9:54 AMPatient ID: Jamie Stevens, female   DOB: 16-May-1996, 26 y.o.   MRN: [id number]

## 2022-10-24 NOTE — Group Note (Signed)
Date:  10/24/2022 Time:  9:40 AM  Group Topic/Focus:  Goals Group:   The focus of this group is to help patients establish daily goals to achieve during treatment and discuss how the patient can incorporate goal setting into their daily lives to aide in recovery. Orientation:   The focus of this group is to educate the patient on the purpose and policies of crisis stabilization and provide a format to answer questions about their admission.  The group details unit policies and expectations of patients while admitted.    Participation Level:  Active  Participation Quality:  Appropriate  Affect:  Appropriate  Cognitive:  Appropriate  Insight: Appropriate  Engagement in Group:  Engaged  Modes of Intervention:  Discussion  Additional Comments:  Pt wants to work on discharge.  Jaquita Rector 10/24/2022, 9:40 AM

## 2022-10-24 NOTE — Plan of Care (Signed)
  Problem: Education: Goal: Emotional status will improve Outcome: Progressing Goal: Mental status will improve Outcome: Progressing   Problem: Activity: Goal: Interest or engagement in activities will improve Outcome: Progressing   Problem: Coping: Goal: Ability to demonstrate self-control will improve Outcome: Progressing   Problem: Health Behavior/Discharge Planning: Goal: Compliance with treatment plan for underlying cause of condition will improve Outcome: Progressing   Problem: Coping: Goal: Coping ability will improve Outcome: Progressing   Problem: Self-Concept: Goal: Level of anxiety will decrease Outcome: Progressing   Problem: Education: Goal: Knowledge of the prescribed therapeutic regimen will improve Outcome: Progressing

## 2022-10-25 DIAGNOSIS — F332 Major depressive disorder, recurrent severe without psychotic features: Principal | ICD-10-CM

## 2022-10-25 MED ORDER — HYDROXYZINE HCL 25 MG PO TABS: 25.0000 mg | ORAL_TABLET | Freq: Three times a day (TID) | ORAL | 0 refills | Status: DC | PRN

## 2022-10-25 MED ORDER — FLUOXETINE HCL 20 MG PO CAPS: 20.0000 mg | ORAL_CAPSULE | Freq: Every day | ORAL | 0 refills | Status: DC

## 2022-10-25 MED ORDER — MELATONIN 5 MG PO TABS: 5.0000 mg | ORAL_TABLET | Freq: Every evening | ORAL | 0 refills | Status: DC | PRN

## 2022-10-25 MED ORDER — HYDROXYZINE HCL 50 MG PO TABS: 50.0000 mg | ORAL_TABLET | Freq: Every evening | ORAL | 0 refills | Status: DC | PRN

## 2022-10-25 MED ORDER — FERROUS SULFATE 325 (65 FE) MG PO TABS: 325.0000 mg | ORAL_TABLET | Freq: Every day | ORAL | 0 refills | Status: DC

## 2022-10-25 NOTE — Progress Notes (Signed)
  [facility] Adult Case Management Discharge Plan :  Will you be returning to the same living situation after discharge:  Yes,  New apartment At discharge, do you have transportation home?: Yes,  Friend Do you have the ability to pay for your medications: Yes,  Insured  Release of information consent forms completed and in the chart;  Patient's signature needed at discharge.  Patient to Follow up at:  Follow-up Information     [facility]. Call.   Why: A referral has been made to this provider. The clinic will call an appointment will be scheduled after the 1st of the year .Please call [phone]  if you have any questions Contact information: [address] [place], [place] [address] phone [phone] Faxc [phone]        [facility]. Go to.   Specialty: Urgent Care Why: Please arrive at 7:15 am to be seen Contact information: [address] [place] [address] [phone]                Next level of care provider has access to [facility] Link:yes  Safety Planning and Suicide Prevention discussed: Yes,   Beckie Salts [phone]      Has patient been referred to the Quitline?: Patient refused referral  Patient has been referred for addiction treatment: N/A Patient to continue working towards treatment goals after discharge. Patient no longer meets criteria for inpatient criteria per attending physician. Continue taking medications as prescribed, nursing to provide instructions at discharge. Follow up with all scheduled appointments.   Hurschel Paynter S Genora Arp, LCSW 10/25/2022, 10:35 AM

## 2022-10-25 NOTE — [facility] Suicide Risk Assessment (Signed)
Suicide Risk Assessment  Discharge Assessment    [facility] Discharge Suicide Risk Assessment   Principal Problem: MDD (major depressive disorder), recurrent severe, without psychosis (HCC) Discharge Diagnoses: Principal Problem:   MDD (major depressive disorder), recurrent severe, without psychosis (HCC) Active Problems:   Suicidal ideation   PTSD (post-traumatic stress disorder)   Generalized anxiety disorder  Total Time spent with patient:  Greater than 30 minutes.  Musculoskeletal: Strength & Muscle Tone: within normal limits Gait & Station: normal Patient leans: N/A  Psychiatric Specialty Exam  Presentation  General Appearance:  Appropriate for Environment; Casual; Fairly Groomed  Eye Contact: Good  Speech: Clear and Coherent; Normal Rate  Speech Volume: Normal  Handedness: Right   Mood and Affect  Mood: Euthymic  Duration of Depression Symptoms: Greater than two weeks  Affect: Appropriate; Congruent   Thought Process  Thought Processes: Coherent; Goal Directed  Descriptions of Associations:Intact  Orientation:Full (Time, Place and Person)  Thought Content:Logical  History of Schizophrenia/Schizoaffective disorder:No  Duration of Psychotic Symptoms:No data recorded Hallucinations:Hallucinations: None  Ideas of Reference:None  Suicidal Thoughts:Suicidal Thoughts: No SI Passive Intent and/or Plan: Without Intent; Without Plan; Without Means to Carry Out; Without Access to Means  Homicidal Thoughts:Homicidal Thoughts: No   Sensorium  Memory: Immediate Good; Recent Good; Remote Good  Judgment: Good  Insight: Good   Executive Functions  Concentration: Good  Attention Span: Good  Recall: Good  Fund of Knowledge: Good  Language: Good   Psychomotor Activity  Psychomotor Activity: Psychomotor Activity: Normal   Assets  Assets: Communication Skills; Desire for Improvement; Housing; Health and safety inspector; Physical  Health; Resilience; Social Support   Sleep  Sleep: Sleep: Good Number of Hours of Sleep: 7   Physical Exam: See H&P. Blood pressure (!) 125/93, pulse 76, temperature 98.5 F (36.9 C), temperature source Oral, resp. rate 18, height 5\' 7"  (1.702 m), weight 136 kg, SpO2 98 %, unknown if currently breastfeeding. Body mass index is 46.96 kg/m.  Mental Status Per Nursing Assessment::   On Admission:  Suicidal ideation indicated by patient, Suicide plan, Self-harm thoughts, Self-harm behaviors, Intention to act on suicide plan  Demographic Factors:  Adolescent or young adult, Low socioeconomic status, and Unemployed  Loss Factors: Financial problems/change in socioeconomic status  Historical Factors: Impulsivity  Risk Reduction Factors:   Living with another person, especially a relative, Positive social support, Positive therapeutic relationship, and Positive coping skills or problem solving skills  Continued Clinical Symptoms:  Depression:   Impulsivity Previous Psychiatric Diagnoses and Treatments  Cognitive Features That Contribute To Risk:  Closed-mindedness, Loss of executive function, Polarized thinking, and Thought constriction (tunnel vision)    Suicide Risk:  Minimal: No identifiable suicidal ideation.  Patients presenting with no risk factors but with morbid ruminations; may be classified as minimal risk based on the severity of the depressive symptoms  Plan Of Care/Follow-up recommendations:  See discharge recommendation above.  [phone], NP, pmhnp, fnp-bc. 10/25/2022, 9:53 AM

## 2022-10-25 NOTE — Progress Notes (Signed)
  Administered PRN Hydroxyzine and Melatonin per MAR per pt request.

## 2022-10-25 NOTE — Group Note (Signed)
Date:  10/25/2022 Time:  10:09 AM  Group Topic/Focus:  Goals Group:   The focus of this group is to help patients establish daily goals to achieve during treatment and discuss how the patient can incorporate goal setting into their daily lives to aide in recovery.    Participation Level:  Did Not Attend Participation Quality:      Affect:      Cognitive:      Insight: None  Engagement in Group:  Defensive and    Modes of Intervention:      Additional Comments:     Reymundo Poll 10/25/2022, 10:09 AM

## 2022-10-25 NOTE — Discharge Summary (Signed)
Physician Discharge Summary Note  Patient:  Jamie Stevens is an 26 y.o., female  MRN:  [id number]  DOB:  1996-10-14  Patient phone:  [phone] (home)   Patient address:   [address] [place] [address],   Total Time spent with patient:  Greater than 30 minutes  Date of Admission:  10/19/2022  Date of Discharge: 10-25-22  Reason for Admission: Worsening suicidal ideations.  Principal Problem: MDD (major depressive disorder), recurrent severe, without psychosis (HCC)  Discharge Diagnoses: Principal Problem:   MDD (major depressive disorder), recurrent severe, without psychosis (HCC) Active Problems:   Suicidal ideation   PTSD (post-traumatic stress disorder)   Generalized anxiety disorder  Past Psychiatric History: Major depressive disorder, recurrent episodes, PTSD, GAD  Past Medical History:  Past Medical History:  Diagnosis Date   Asthma    Depression    GERD (gastroesophageal reflux disease)    Obesity    Panic anxiety syndrome    Seasonal allergies    Vision abnormalities    Pt wears glasses    Past Surgical History:  Procedure Laterality Date   ADENOIDECTOMY     TONSILLECTOMY     Family History:  Family History  Problem Relation Age of Onset   Depression Mother    Vision loss Mother    Hyperlipidemia Mother    Diabetes Mother    Cancer Mother    Alcohol abuse Father    Depression Father    Heart disease Father    Cancer Father    Family Psychiatric  History: See H&P.  Social History:  Social History   Substance and Sexual Activity  Alcohol Use No     Social History   Substance and Sexual Activity  Drug Use Yes   Types: Marijuana    Social History   Socioeconomic History   Marital status: Single    Spouse name: Not on file   Number of children: 0   Years of education: Not on file   Highest education level: Some college, no degree  Occupational History   Not on file  Tobacco Use   Smoking status: Some  Days    Passive exposure: Yes   Smokeless tobacco: Never  Vaping Use   Vaping Use: Never used  Substance and Sexual Activity   Alcohol use: No   Drug use: Yes    Types: Marijuana   Sexual activity: Not Currently    Birth control/protection: None  Other Topics Concern   Not on file  Social History Narrative   Not on file   Social Determinants of Health   Financial Resource Strain: Not on file  Food Insecurity: Food Insecurity Present (10/19/2022)   Hunger Vital Sign    Worried About Running Out of Food in the Last Year: Often true    Ran Out of Food in the Last Year: Often true  Transportation Needs: Unmet Transportation Needs (10/19/2022)   PRAPARE - Administrator, Civil Service (Medical): Yes    Lack of Transportation (Non-Medical): Yes  Physical Activity: Not on file  Stress: Not on file  Social Connections: Not on file   Hospital Course: (Per admission evaluation notes): Jamie Stevens is a 26 year old, African-American female with a past psychiatric history significant for major depressive disorder, ADHD, cannabis use, and past suicide attempts who voluntarily presented to [facility] as a walk-in for the assessment of the suicidal ideations with reported recent attempt. Patient reported that she is currently a senior  at [employer] and was recently notified that she is on academic dismissal and is being evicted as a result.  She reported that both parents are deceased and is estranged from other family members within the area.  Patient endorsed having minimal support and no active outpatient services.  She reported having a history of hospitalization at [facility] x2 with her last admission occurring on 02/2022.   Prior to this discharge, Jamie Stevens was seen & evaluated for mental health stability. The current laboratory findings were reviewed (stable), nurses notes & vital signs were reviewed as well. There are no current mental health or medical  issues that should prevent this discharge at this time. Patient is being discharged to continue mental health care/medication management as noted below.   After the above admission evaluation, Jamie Stevens's presenting symptoms were noted. She was recommended for mood stabilization treatments. The medication regimen for her presenting symptoms were discussed & with her consent, initiated. She was treated, stabilized & discharged on the medications as listed on her discharge medication lists below. Besides the mood stabilization treatments, Jamie Stevens was also enrolled & participated in the group counseling sessions being offered & held on this unit. She learned coping skills. She presented other significant pre-existing medical issues that required treatment. She was resumed & discharged on the medications used for those medical issues. She tolerated her treatment regimen without any adverse effects or reactions reported. Jamie Stevens's symptoms responded well to her treatment regimen warranting this discharge. Patient is also mentally/medically stable & agreeable to this discharge.  During the course of this hospitalization, Patient's presented no instances of behavioral issues that required restraints or immediate intervention. Patient remained safe on the unit. There were no instances of self-harming behaviors. There were no threats to other patients/staff. Jamie Stevens remained cooperative to her daily routines & in taking her treatment regimen as recommended by her treatment team. She participated in the group sessions and interacted with staff and other patients appropriately.   During the course of this hospitalization, patient's symptoms responded well to her treatment regimen & her mood has improved. She currently presents mentally & medically stable to be discharged to continue mental health care & medication management on an outpatient as noted below. At this time of her hospital discharge, She presents alert, attentive,  well related, pleasant, mood improved & currently presents euthymic. Her affect is appropriate & positively reactive, no thought disorder noted, no suicidal or self injurious ideations reported, no homicidal or violent ideations present, no hallucinations, no delusions, not internally preoccupied. She is future oriented. She denies any medication side effects, which we reviewed with her. She will continue further mental health care & medication management on an outpatient basis as noted below. She is provided with all the necessary information needed to make this appointment without problems. She was able to engage in safety planning including plan to return to [facility] or contact emergency services if she feels unable to maintain her own safety or the safety of others. Pt had no further questions, comments or concerns.  She left [facility] in no apparent distress with all personal belongings. Transportation per her friend.       Physical Findings: AIMS:  , ,  ,  ,    CIWA:    COWS:     Musculoskeletal: Strength & Muscle Tone: within normal limits Gait & Station: normal Patient leans: N/A   Psychiatric Specialty Exam:  Presentation  General Appearance:  Appropriate for Environment; Casual; Fairly Groomed  Eye Contact: Good  Speech: Clear and Coherent; Normal Rate  Speech Volume: Normal  Handedness: Right   Mood and Affect  Mood: Euthymic  Affect: Appropriate; Congruent   Thought Process  Thought Processes: Coherent; Goal Directed  Descriptions of Associations:Intact  Orientation:Full (Time, Place and Person)  Thought Content:Logical  History of Schizophrenia/Schizoaffective disorder:No  Duration of Psychotic Symptoms:No data recorded Hallucinations:Hallucinations: None  Ideas of Reference:None  Suicidal Thoughts:Suicidal Thoughts: No SI Passive Intent and/or Plan: Without Intent; Without Plan; Without Means to Carry Out; Without Access to Means  Homicidal  Thoughts:Homicidal Thoughts: No   Sensorium  Memory: Immediate Good; Recent Good; Remote Good  Judgment: Good  Insight: Good   Executive Functions  Concentration: Good  Attention Span: Good  Recall: Good  Fund of Knowledge: Good  Language: Good   Psychomotor Activity  Psychomotor Activity:Psychomotor Activity: Normal   Assets  Assets: Communication Skills; Desire for Improvement; Housing; Health and safety inspectorinancial Resources/Insurance; Physical Health; Resilience; Social Support   Sleep  Sleep:Sleep: Good Number of Hours of Sleep: 7   Physical Exam: Physical Exam Vitals and nursing note reviewed.  HENT:     Head: Normocephalic.     Nose: Nose normal.  Eyes:     Pupils: Pupils are equal, round, and reactive to light.  Cardiovascular:     Pulses: Normal pulses.  Pulmonary:     Effort: Pulmonary effort is normal.  Genitourinary:    Comments: Deferred Musculoskeletal:        General: Normal range of motion.     Cervical back: Normal range of motion.  Skin:    General: Skin is warm and dry.  Neurological:     General: No focal deficit present.     Mental Status: She is alert and oriented to person, place, and time. Mental status is at baseline.    Review of Systems  Constitutional:  Negative for chills, diaphoresis and fever.  HENT:  Negative for congestion and sore throat.   Eyes:  Negative for blurred vision.  Respiratory:  Negative for cough, shortness of breath and wheezing.   Cardiovascular:  Negative for chest pain and palpitations.  Gastrointestinal:  Negative for abdominal pain, constipation, diarrhea, heartburn, nausea and vomiting.  Genitourinary:  Negative for dysuria.  Musculoskeletal:  Negative for joint pain and myalgias.  Skin:  Negative for itching and rash.  Neurological:  Negative for dizziness, tingling, tremors, sensory change, speech change, focal weakness, seizures, loss of consciousness, weakness and headaches.  Endo/Heme/Allergies:         NKDA  Psychiatric/Behavioral:  Positive for depression (Hx of (stable on medication).) and substance abuse (Hx of (stable)). Negative for hallucinations, memory loss and suicidal ideas. The patient has insomnia (Hx of (stable on medication).). The patient is not nervous/anxious.    Blood pressure (!) 125/93, pulse 76, temperature 98.5 F (36.9 C), temperature source Oral, resp. rate 18, height 5\' 7"  (1.702 m), weight 136 kg, SpO2 98 %, unknown if currently breastfeeding. Body mass index is 46.96 kg/m.  Social History   Tobacco Use  Smoking Status Some Days   Passive exposure: Yes  Smokeless Tobacco Never   Tobacco Cessation: An FDA-approved tobacco cessation medication recommended at discharge  Blood Alcohol level:  Lab Results  Component Value Date   [facility]   Metabolic Disorder Labs:  Lab Results  Component Value Date   HGBA1C 5.1 03/06/2022   MPG 99.67 03/06/2022   No results found for: "PROLACTIN" Lab Results  Component Value Date   CHOL 185 10/20/2022   TRIG  94 10/20/2022   HDL 50 10/20/2022   CHOLHDL 3.7 10/20/2022   VLDL 19 10/20/2022   LDLCALC 116 (H) 10/20/2022   See Psychiatric Specialty Exam and Suicide Risk Assessment completed by Attending Physician prior to discharge.  Discharge destination:  Home  Is patient on multiple antipsychotic therapies at discharge:  No   Has Patient had three or more failed trials of antipsychotic monotherapy by history:  No  Recommended Plan for Multiple Antipsychotic Therapies: NA  Allergies as of 10/25/2022   No Known Allergies      Medication List     TAKE these medications      Indication  albuterol 108 (90 Base) MCG/ACT inhaler Commonly known as: VENTOLIN HFA Inhale 1-2 puffs into the lungs every 4 (four) hours as needed for wheezing or shortness of breath.  Indication: Asthma   ferrous sulfate 325 (65 FE) MG tablet Take 1 tablet (325 mg total) by mouth daily with breakfast. For anemia Start  taking on: October 26, 2022  Indication: Iron Deficiency   FLUoxetine 20 MG capsule Commonly known as: PROZAC Take 1 capsule (20 mg total) by mouth daily. For depression Start taking on: October 26, 2022  Indication: Major Depressive Disorder   hydrOXYzine 25 MG tablet Commonly known as: ATARAX Take 1 tablet (25 mg total) by mouth 3 (three) times daily as needed for anxiety.  Indication: Feeling Anxious   hydrOXYzine 50 MG tablet Commonly known as: ATARAX Take 1 tablet (50 mg total) by mouth at bedtime as needed (Insomnia).  Indication: Insomnia   melatonin 5 MG Tabs Take 1 tablet (5 mg total) by mouth at bedtime as needed. For sleep  Indication: Trouble Sleeping         Follow-up recommendations: Activity:  As tolerated Diet: As recommended by your primary care doctor. Keep all scheduled follow-up appointments as recommended.   Comments: Comments: Patient is recommended to follow-up care on an outpatient basis as noted above. Prescriptions sent to pt's pharmacy of choice at discharge.   Patient agreeable to plan.   Given opportunity to ask questions.   Appears to feel comfortable with discharge denies any current suicidal or homicidal thought. Patient is also instructed prior to discharge to: Take all medications as prescribed by his/her mental healthcare provider. Report any adverse effects and or reactions from the medicines to his/her outpatient provider promptly. Patient has been instructed & cautioned: To not engage in alcohol and or illegal drug use while on prescription medicines. In the event of worsening symptoms, patient is instructed to call the crisis hotline, 911 and or go to the nearest ED for appropriate evaluation and treatment of symptoms. To follow-up with his/her primary care provider for your other medical issues, concerns and or health care needs.  Signed: Armandina Stammer, NP, pmhnp, fnp-bc. 10/25/2022, 9:26 AM

## 2022-10-25 NOTE — Progress Notes (Signed)
Patient discharged from [facility] on 10/13/2022 at 1200. Patient denies SI, plan, and intention. Suicide safety plan completed, reviewed with this RN, given to the patient, and a copy in the chart. Patient denies HI/AVH upon discharge. Patient rates her depression a 0/10 and her anxiety a 0/10. Patient is alert, oriented, and cooperative. RN provided patient with discharge paperwork and reviewed information with patient. Patient expressed that she understood all of the discharge instructions. Pt was satisfied with belongings returned to her from the locker and at bedside. Discharged patient to [facility] waiting room.

## 2022-10-26 LAB — HEMOGLOBIN A1C
Hgb A1c MFr Bld: 5.5 % (ref 4.8–5.6)
Mean Plasma Glucose: 111 mg/dL

## 2022-12-06 DIAGNOSIS — R509 Fever, unspecified: Secondary | ICD-10-CM | POA: Diagnosis not present

## 2022-12-06 DIAGNOSIS — R0981 Nasal congestion: Secondary | ICD-10-CM | POA: Diagnosis not present

## 2022-12-06 DIAGNOSIS — J069 Acute upper respiratory infection, unspecified: Secondary | ICD-10-CM | POA: Diagnosis not present

## 2023-01-15 ENCOUNTER — Ambulatory Visit (INDEPENDENT_AMBULATORY_CARE_PROVIDER_SITE_OTHER): Payer: BLUE CROSS/BLUE SHIELD | Admitting: Mental Health

## 2023-01-15 ENCOUNTER — Encounter (HOSPITAL_COMMUNITY): Payer: Self-pay

## 2023-01-15 DIAGNOSIS — F332 Major depressive disorder, recurrent severe without psychotic features: Secondary | ICD-10-CM

## 2023-01-15 DIAGNOSIS — F4321 Adjustment disorder with depressed mood: Secondary | ICD-10-CM

## 2023-01-15 DIAGNOSIS — F1210 Cannabis abuse, uncomplicated: Secondary | ICD-10-CM

## 2023-01-15 DIAGNOSIS — F4310 Post-traumatic stress disorder, unspecified: Secondary | ICD-10-CM
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
## 2023-01-17 ENCOUNTER — Ambulatory Visit (INDEPENDENT_AMBULATORY_CARE_PROVIDER_SITE_OTHER): Payer: BLUE CROSS/BLUE SHIELD | Admitting: Physician Assistant

## 2023-01-17 ENCOUNTER — Encounter (HOSPITAL_COMMUNITY): Payer: Self-pay | Admitting: Physician Assistant

## 2023-01-17 VITALS — BP 132/77 | HR 62 | Temp 97.6°F | Ht 67.0 in | Wt 303.0 lb

## 2023-01-17 DIAGNOSIS — F1210 Cannabis abuse, uncomplicated: Secondary | ICD-10-CM

## 2023-01-17 DIAGNOSIS — F332 Major depressive disorder, recurrent severe without psychotic features: Secondary | ICD-10-CM | POA: Diagnosis not present

## 2023-01-17 DIAGNOSIS — F4310 Post-traumatic stress disorder, unspecified: Secondary | ICD-10-CM | POA: Diagnosis not present

## 2023-01-17 DIAGNOSIS — R41840 Attention and concentration deficit: Secondary | ICD-10-CM | POA: Insufficient documentation

## 2023-01-17 DIAGNOSIS — F4321 Adjustment disorder with depressed mood: Secondary | ICD-10-CM

## 2023-01-17 DIAGNOSIS — F5105 Insomnia due to other mental disorder: Secondary | ICD-10-CM | POA: Insufficient documentation

## 2023-01-17 DIAGNOSIS — F99 Mental disorder, not otherwise specified: Secondary | ICD-10-CM

## 2023-01-17 DIAGNOSIS — F411 Generalized anxiety disorder: Secondary | ICD-10-CM

## 2023-01-17 MED ORDER — ATOMOXETINE HCL 40 MG PO CAPS: 40.0000 mg | ORAL_CAPSULE | Freq: Every day | ORAL | 1 refills | Status: DC

## 2023-01-17 MED ORDER — FLUOXETINE HCL 20 MG PO CAPS: 20.0000 mg | ORAL_CAPSULE | Freq: Every day | ORAL | 1 refills | Status: DC

## 2023-01-17 MED ORDER — HYDROXYZINE HCL 50 MG PO TABS: 50.0000 mg | ORAL_TABLET | Freq: Every evening | ORAL | 1 refills | Status: DC | PRN

## 2023-01-17 MED ORDER — HYDROXYZINE HCL 25 MG PO TABS: 25.0000 mg | ORAL_TABLET | Freq: Three times a day (TID) | ORAL | 1 refills | Status: DC | PRN

## 2023-01-17 MED ORDER — MELATONIN 5 MG PO TABS: 5.0000 mg | ORAL_TABLET | Freq: Every evening | ORAL | 1 refills | Status: DC | PRN
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
## 2023-02-19 ENCOUNTER — Ambulatory Visit (INDEPENDENT_AMBULATORY_CARE_PROVIDER_SITE_OTHER): Payer: BLUE CROSS/BLUE SHIELD | Admitting: Mental Health

## 2023-02-19 DIAGNOSIS — F4321 Adjustment disorder with depressed mood: Secondary | ICD-10-CM

## 2023-02-19 DIAGNOSIS — F4310 Post-traumatic stress disorder, unspecified: Secondary | ICD-10-CM

## 2023-02-19 DIAGNOSIS — F1210 Cannabis abuse, uncomplicated: Secondary | ICD-10-CM

## 2023-02-19 DIAGNOSIS — F332 Major depressive disorder, recurrent severe without psychotic features: Secondary | ICD-10-CM | POA: Diagnosis not present

## 2023-02-19 DIAGNOSIS — F411 Generalized anxiety disorder: Secondary | ICD-10-CM

## 2023-02-19 NOTE — Progress Notes (Signed)
   THERAPIST PROGRESS NOTE  Session Time: 9:15 am ( 47 minutes)   Participation Level: Active  Behavioral Response: CasualAlertDysphoric  Type of Therapy: Individual Therapy  Treatment Goals addressed:  STG: "Grief" Jacey will decrease sxs of depression AEB processing through feelings of grief in healthy balanced manner per self report within the next 90 days.   STG:"Manage stress, grace." Ronne will increase management of depressive sxs AEB development of x 3 effective emotional regulation and distress tolerance skills with ability to restructure maladaptive cognitions as needed within the next 90 days    ProgressTowards Goals: Initial  Interventions: CBT and Supportive Solution Focused  Summary: Jamie Stevens is a 27 y.o. female who presents with dx of major depression, recurrent severe; GAD, PTSD, unresolved grief and cannabis use. Presents alert and oriented; mood low and dysphoric. Speech clear and coherent at normal rate and tone. Engaged and receptive to interentions. Presenting concern for anxiety with feelings of overwhelm. Notes for end of semester to be approaching and to be in jeopardy of failing classes and not being able to graduate. Shares high degree of work to complete and if she is able to do well will allow grade to present to passing. Notes additional stressors of needing to find work and having to move out of student housing. Denies having supports in place, becomes tearful " I have to do it alone." Shares hx of being able to complete tasks in which she needs to accomplish. Shares "what if" thoughts. Able to engage with therapist and explore ability to make plan for realistic anxiety and engaging in helpful thinking. Able to engage with therapist with development of plan to complete needed work and prepare for presentation with therapist in session. Notes to feel better at conclusion of session. Denies SI/HI. Intial work towards treatment plan; sxs unchanged at this time.     Suicidal/Homicidal: Nowithout intent/plan  Therapist Response: Therapist engaged [name] in therapy session. Completed check in and assessed for current level of functioning, sxs management and level of stressors. Provided safe space for Aslan to share thoughts and concerns for stressors. Supported in identifying realistic vs. Unrealistic anxiety  and presence of maladaptive thinking. Supported in working to develop a plan to navigate realistic anxiety for upcoming assignments. Encouraged to right down and reviewed in session. Encouraged follow up with online job boards to support in obtaining additional employment. Reviewed session and provided follow up.  Plan: Return again in  x 4 weeks.  Diagnosis: MDD (major depressive disorder), recurrent severe, without psychosis  Generalized anxiety disorder  PTSD (post-traumatic stress disorder)  Unresolved grief  Cannabis abuse  Collaboration of Care: Other None  Patient/Guardian was advised Release of Information must be obtained prior to any record release in order to collaborate their care with an outside provider. Patient/Guardian was advised if they have not already done so to contact the registration department to sign all necessary forms in order for [place] to release information regarding their care.   Consent: Patient/Guardian gives verbal consent for treatment and assignment of benefits for services provided during this visit. Patient/Guardian expressed understanding and agreed to proceed.   Stephan Minister [place], [facility] 02/19/2023

## 2023-02-26 ENCOUNTER — Encounter (HOSPITAL_COMMUNITY): Payer: BLUE CROSS/BLUE SHIELD | Admitting: Physician Assistant

## 2023-02-26 ENCOUNTER — Encounter (HOSPITAL_COMMUNITY): Payer: Self-pay

## 2023-03-26 ENCOUNTER — Ambulatory Visit (INDEPENDENT_AMBULATORY_CARE_PROVIDER_SITE_OTHER): Payer: BLUE CROSS/BLUE SHIELD | Admitting: Mental Health

## 2023-03-26 DIAGNOSIS — F4310 Post-traumatic stress disorder, unspecified: Secondary | ICD-10-CM

## 2023-03-26 DIAGNOSIS — F411 Generalized anxiety disorder: Secondary | ICD-10-CM

## 2023-03-26 DIAGNOSIS — F332 Major depressive disorder, recurrent severe without psychotic features: Secondary | ICD-10-CM | POA: Diagnosis not present

## 2023-03-26 DIAGNOSIS — F4321 Adjustment disorder with depressed mood: Secondary | ICD-10-CM

## 2023-03-26 NOTE — Progress Notes (Unsigned)
   THERAPIST PROGRESS NOTE  Session Time: 3:10pm   Participation Level: {BHH PARTICIPATION LEVEL:22264}  Behavioral Response: {Appearance:22683}{BHH LEVEL OF CONSCIOUSNESS:22305}{BHH MOOD:22306}  Type of Therapy: {CHL AMB BH Type of Therapy:21022741}  Treatment Goals addressed: STG: "Grief" [name] will decrease sxs of depression AEB processing through feelings of grief in healthy balanced manner per self report within the next 90 days.    STG:"Manage stress, grace." [name] will increase management of depressive sxs AEB development of x 3 effective emotional regulation and distress tolerance skills with ability to restructure maladaptive cognitions as needed within the next 90 days   ProgressTowards Goals: {Progress Towards Goals:21014066}  Interventions: {CHL AMB BH Type of Intervention:21022753}  Summary:  Jamie Stevens is a 27 y.o. female who presents with dx of major depression, recurrent severe; GAD, PTSD, unresolved grief and cannabis use. Presents alert and oriented; mood low and dysphoric. Speech clear and coherent at normal rate and tone. Engaged and receptive to interentions. Presenting concern for anxiety with feelings of overwhelm. Notes for end of semester to be approaching and to be in jeopardy of failing   Suicidal/Homicidal: {BHH YES OR NO:22294}{yes/no/with/without intent/plan:22693}  Therapist Response: Therapist engaged [name] in therapy session. Completed check in and assessed for current level of functioning, sxs management and level of stressors. Provided safe space for [name] to share thoughts and concerns for stressors. Supported   Plan: Return again in *** weeks.  Diagnosis: No diagnosis found.  Collaboration of Care: {BH OP Collaboration of Care:21014065}  Patient/Guardian was advised Release of Information must be obtained prior to any record release in order to collaborate their care with an outside provider. Patient/Guardian was advised if they have not  already done so to contact the registration department to sign all necessary forms in order for [place] to release information regarding their care.   Consent: Patient/Guardian gives verbal consent for treatment and assignment of benefits for services provided during this visit. Patient/Guardian expressed understanding and agreed to proceed.   Stephan Minister [place], [facility] 03/26/2023

## 2023-04-02 ENCOUNTER — Other Ambulatory Visit (HOSPITAL_COMMUNITY): Payer: Self-pay | Admitting: Physician Assistant

## 2023-04-02 DIAGNOSIS — R41840 Attention and concentration deficit: Secondary | ICD-10-CM

## 2023-04-11 ENCOUNTER — Ambulatory Visit (INDEPENDENT_AMBULATORY_CARE_PROVIDER_SITE_OTHER): Payer: BLUE CROSS/BLUE SHIELD | Admitting: Mental Health

## 2023-04-11 DIAGNOSIS — F4310 Post-traumatic stress disorder, unspecified: Secondary | ICD-10-CM

## 2023-04-11 DIAGNOSIS — F332 Major depressive disorder, recurrent severe without psychotic features: Secondary | ICD-10-CM | POA: Diagnosis not present

## 2023-04-11 DIAGNOSIS — F411 Generalized anxiety disorder: Secondary | ICD-10-CM

## 2023-04-11 NOTE — Progress Notes (Signed)
   THERAPIST PROGRESS NOTE  Session Time:  2:04 pm (54 minutes)  Participation Level: Active  Behavioral Response: CasualAlertdysphoric  Type of Therapy: Individual Therapy  Treatment Goals addressed: STG: "Grief" Jamie Stevens will decrease sxs of depression AEB processing through feelings of grief in healthy balanced manner per self report within the next 90 days.    STG:"Manage stress, grace." Jamie Stevens will increase management of depressive sxs AEB development of x 3 effective emotional regulation and distress tolerance skills with ability to restructure maladaptive cognitions as needed within the next 90 days   ProgressTowards Goals: Progressing  Interventions: CBT and Supportive  Summary:   Jamie Stevens is a 27 y.o. female who presents with dx of major depression, recurrent severe; GAD, PTSD, unresolved grief and cannabis use. Presents alert and oriented; mood low; neutral. Notes moods to have been "up and down." Speech clear and coherent at normal rate and tone. Engaged and receptive to interentions. Presenting concern for anxiety and depression with feelings of overwhelm. Notes to have been able to secure employment but notes ongoing stress and worry related to housing with current residing with friend. Shares barriers to obtaining housing. Shares with therapist presence of mother having a house in which was in her name that was supposed to be left to her however a cousin lives in the home and family is unwilling to allow her to take possession of. Shares history of family dynamics. Explored with therapist desire to communicate with family and levels of engagement she would like to have with family members. Denies to feel supportied by them and notes did not feel supported at the time of mother's passing. Shares feelings of ongoing grief of losing parents and working to navigate without parental support. Shares ongoing maladaptive thoughts some ability to reframe with support of therapist. Agrees  to explore relationships with family and connection of feelings of low mood. Denies SI/HI. Working towards progress with goals with processing feelings of grief and depression in balanced manner; developing ability to emotional regulate and manage stress.    Suicidal/Homicidal: Nowithout intent/plan  Therapist Response: Therapist engaged Jamie Stevens in therapy session. Completed check in and assessed for current level of functioning, sxs management and level of stressors. Provided safe space for Jamie Stevens to share thoughts and concerns for stressors. Supported Jamie Stevens in processing thoughts and feeling related to family and ability to set boundaries. Explored types of relationships in which she would like to have with family members. Provided support and encouragement; validated feelings. Explored coping skills present to manage moods and feelings of stress. Encouraged working to remove barriers to housing and possible options. Encouraged processing thoughts in balanced manner and supported in exploring alternative thoughts. Reviewed session and provided follow up. Tele-therapy visit for next session.   Plan: Return again in  x 4 weeks.  Diagnosis: MDD (major depressive disorder), recurrent severe, without psychosis (HCC)  Generalized anxiety disorder  PTSD (post-traumatic stress disorder)  Collaboration of Care: Other None  Patient/Guardian was advised Release of Information must be obtained prior to any record release in order to collaborate their care with an outside provider. Patient/Guardian was advised if they have not already done so to contact the registration department to sign all necessary forms in order for [place] to release information regarding their care.   Consent: Patient/Guardian gives verbal consent for treatment and assignment of benefits for services provided during this visit. Patient/Guardian expressed understanding and agreed to proceed.   Stephan Minister [place], [facility] 04/11/2023

## 2023-04-12 ENCOUNTER — Encounter (HOSPITAL_COMMUNITY): Payer: BLUE CROSS/BLUE SHIELD | Admitting: Physician Assistant

## 2023-05-15 ENCOUNTER — Telehealth (HOSPITAL_COMMUNITY): Payer: Self-pay | Admitting: Physician Assistant

## 2023-05-15 ENCOUNTER — Other Ambulatory Visit (HOSPITAL_COMMUNITY): Payer: Self-pay | Admitting: Physician Assistant

## 2023-05-15 DIAGNOSIS — F5105 Insomnia due to other mental disorder: Secondary | ICD-10-CM

## 2023-05-15 DIAGNOSIS — F411 Generalized anxiety disorder: Secondary | ICD-10-CM

## 2023-05-15 DIAGNOSIS — R41840 Attention and concentration deficit: Secondary | ICD-10-CM

## 2023-05-15 DIAGNOSIS — F4310 Post-traumatic stress disorder, unspecified: Secondary | ICD-10-CM

## 2023-05-15 DIAGNOSIS — F332 Major depressive disorder, recurrent severe without psychotic features: Secondary | ICD-10-CM

## 2023-05-15 MED ORDER — HYDROXYZINE HCL 50 MG PO TABS: 50.0000 mg | ORAL_TABLET | Freq: Every evening | ORAL | 1 refills | Status: DC | PRN

## 2023-05-15 MED ORDER — ATOMOXETINE HCL 40 MG PO CAPS: 40.0000 mg | ORAL_CAPSULE | Freq: Every day | ORAL | 1 refills | Status: DC

## 2023-05-15 MED ORDER — HYDROXYZINE HCL 25 MG PO TABS: 25.0000 mg | ORAL_TABLET | Freq: Three times a day (TID) | ORAL | 1 refills | Status: DC | PRN

## 2023-05-15 MED ORDER — FLUOXETINE HCL 20 MG PO CAPS: 20.0000 mg | ORAL_CAPSULE | Freq: Every day | ORAL | 1 refills | Status: DC

## 2023-05-15 NOTE — Progress Notes (Signed)
Patient being provided a bridge on her medications until her next appointment.  Patient's medications to be e-prescribed to pharmacy of choice.

## 2023-05-15 NOTE — Telephone Encounter (Signed)
Message acknowledged and reviewed. Patient's medication to be e-prescribed to pharmacy of choice.

## 2023-05-23 ENCOUNTER — Ambulatory Visit (INDEPENDENT_AMBULATORY_CARE_PROVIDER_SITE_OTHER): Payer: BLUE CROSS/BLUE SHIELD | Admitting: Mental Health

## 2023-05-23 DIAGNOSIS — F4310 Post-traumatic stress disorder, unspecified: Secondary | ICD-10-CM | POA: Diagnosis not present

## 2023-05-23 DIAGNOSIS — F332 Major depressive disorder, recurrent severe without psychotic features: Secondary | ICD-10-CM | POA: Diagnosis not present

## 2023-05-23 DIAGNOSIS — F411 Generalized anxiety disorder: Secondary | ICD-10-CM | POA: Diagnosis not present

## 2023-05-23 NOTE — Progress Notes (Signed)
THERAPIST PROGRESS NOTE Virtual Visit via Video Note  I connected with Jamie Stevens on 05/23/23 at  1:00 PM EDT by a video enabled telemedicine application and verified that I am speaking with the correct person using two identifiers.  Location: Patient: at work Provider: office   I discussed the limitations of evaluation and management by telemedicine and the availability of in person appointments. The patient expressed understanding and agreed to proceed.  I discussed the assessment and treatment plan with the patient. The patient was provided an opportunity to ask questions and all were answered. The patient agreed with the plan and demonstrated an understanding of the instructions.   The patient was advised to call back or seek an in-person evaluation if the symptoms worsen or if the condition fails to improve as anticipated.  I provided 54 minutes of non-face-to-face time during this encounter.   Dorris Singh, [facility]   Session Time: 1:00 pm (54 minutes)  Participation Level: Active  Behavioral Response: CasualAlertEuthymic  Type of Therapy: Individual Therapy  Treatment Goals addressed: STG: "Grief" Jamie Stevens will decrease sxs of depression AEB processing through feelings of grief in healthy balanced manner per self report within the next 90 days.    STG:"Manage stress, grace." Jamie Stevens will increase management of depressive sxs AEB development of x 3 effective emotional regulation and distress tolerance skills with ability to restructure maladaptive cognitions as needed within the next 90 days   ProgressTowards Goals: Progressing  Interventions: CBT and Supportive Solution focused  Summary: Jamie Stevens is a 27 y.o. female who presents with dx of major depression, recurrent severe; GAD, PTSD, unresolved grief and cannabis use. Presents alert and oriented; mood adequate; neutral. Notes moods to have been "up and down." Speech clear and coherent at normal rate and  tone. Engaged and receptive to interventions. Shares to currently be at work and reports for thngs to be going well so far at new employment ad working to get adjusted. Shares main stressor related to difficulty finding housing with eviction on her background. Does not want to pursue fighting for mothers home at this time. Notes to have attempted to find roommate with website but shares concerns with this as well. Engaged in solution building with therapist and able to identifies ways to continue to explore ability to secure housing independently. Shares can have a tendency to rush and over thinking things and be in "survival mode." Notes desire to not effect friendship she has with friend in which she is currently residing. Agrees to discuss with friend housing situation and access timeframe in which she has to secure housing while continuing to look and safe funds and prepare. Denies safety concerns, ongoing progress with goals. Denies SI/HI   Suicidal/Homicidal: Nowithout intent/plan  Therapist Response: Therapist engaged [name] in [employer]. Completed check in and assessed for current level of functioning, sxs management and level of stressors. Provided safe space for [place] to share thoughts and concerns for current stressors. Provided supportive feedback and validated feelings. Engaged Jamie Stevens in solution building and supporting in brain storming options for housing. Cautioned on making decisions out of panic and working to secure housing that will be stable. Explored ability to have realistic expectations for housing provided eviction and ability to work find additional housing later. Encouraged communication with roommate and working to fully plan and set self up for success. Assessed for level of depression and use of coping skills. Reviewed session and provided follow up.   Plan: Return again in  x 6 weeks.  Diagnosis: MDD (major depressive disorder), recurrent severe, without psychosis  (HCC)  Generalized anxiety disorder  PTSD (post-traumatic stress disorder)  Collaboration of Care: Other None  Patient/Guardian was advised Release of Information must be obtained prior to any record release in order to collaborate their care with an outside provider. Patient/Guardian was advised if they have not already done so to contact the registration department to sign all necessary forms in order for [place] to release information regarding their care.   Consent: Patient/Guardian gives verbal consent for treatment and assignment of benefits for services provided during this visit. Patient/Guardian expressed understanding and agreed to proceed.   Stephan Minister [place], [facility] 05/23/2023

## 2023-06-20 ENCOUNTER — Encounter (HOSPITAL_COMMUNITY): Payer: Self-pay

## 2023-06-20 ENCOUNTER — Telehealth (INDEPENDENT_AMBULATORY_CARE_PROVIDER_SITE_OTHER): Payer: BLUE CROSS/BLUE SHIELD | Admitting: Physician Assistant

## 2023-06-20 ENCOUNTER — Encounter (HOSPITAL_COMMUNITY): Payer: Self-pay | Admitting: Physician Assistant

## 2023-06-20 DIAGNOSIS — F5105 Insomnia due to other mental disorder: Secondary | ICD-10-CM

## 2023-06-20 DIAGNOSIS — R41840 Attention and concentration deficit: Secondary | ICD-10-CM

## 2023-06-20 DIAGNOSIS — F332 Major depressive disorder, recurrent severe without psychotic features: Secondary | ICD-10-CM

## 2023-06-20 DIAGNOSIS — F411 Generalized anxiety disorder: Secondary | ICD-10-CM | POA: Diagnosis not present

## 2023-06-20 DIAGNOSIS — F4310 Post-traumatic stress disorder, unspecified: Secondary | ICD-10-CM

## 2023-06-20 MED ORDER — HYDROXYZINE HCL 25 MG PO TABS: 25.0000 mg | ORAL_TABLET | Freq: Three times a day (TID) | ORAL | 1 refills | Status: DC | PRN

## 2023-06-20 MED ORDER — FLUOXETINE HCL 20 MG PO CAPS: 20.0000 mg | ORAL_CAPSULE | Freq: Every day | ORAL | 1 refills | Status: DC

## 2023-06-20 MED ORDER — ATOMOXETINE HCL 40 MG PO CAPS: 40.0000 mg | ORAL_CAPSULE | Freq: Every day | ORAL | 1 refills | Status: DC

## 2023-06-20 MED ORDER — HYDROXYZINE HCL 50 MG PO TABS: 50.0000 mg | ORAL_TABLET | Freq: Every evening | ORAL | 1 refills | Status: DC | PRN

## 2023-06-20 NOTE — Progress Notes (Signed)
BH MD/PA/NP OP Progress Note  Virtual Visit via Video Note  I connected with Jamie Stevens on 06/20/23 at 10:30 AM EDT by a video enabled telemedicine application and verified that I am speaking with the correct person using two identifiers.  Location: Patient: Home Provider: Clinic   I discussed the limitations of evaluation and management by telemedicine and the availability of in person appointments. The patient expressed understanding and agreed to proceed.  Follow Up Instructions:   I discussed the assessment and treatment plan with the patient. The patient was provided an opportunity to ask questions and all were answered. The patient agreed with the plan and demonstrated an understanding of the instructions.   The patient was advised to call back or seek an in-person evaluation if the symptoms worsen or if the condition fails to improve as anticipated.  I provided 17 minutes of non-face-to-face time during this encounter.  Meta Hatchet, PA    06/20/2023 6:51 PM Jamie Stevens  MRN:  [id number]  Chief Complaint:  Chief Complaint  Patient presents with   Follow-up   Medication Refill   HPI:   Jamie Stevens is a 27 year old female with a past psychiatric history significant for generalized anxiety disorder, insomnia, attention and concentration deficit, major depressive disorder, and PTSD who presents to [facility] via virtual video visit for follow-up and medication management.  Patient is currently being managed on the following psychiatric medications:  Fluoxetine 20 mg daily Hydroxyzine 25 mg 3 times daily as needed Hydroxyzine 50 mg at bedtime Melatonin 5 mg at bedtime Strattera 40 mg daily  Patient presents to the encounter stating that she has only been taking Strattera at this time.  She reports that she was informed by her pharmacist that Strattera and fluoxetine were both targeting her symptoms of depression.   Since slowly taking Strattera, patient reports that her mood has been consistent.  She does report that she experiences aggression at times.  Patient continues to endorse depression and rates her depression as 6 out of 10 with 10 being most severe.  Patient endorses depressive episodes 2 to 3 days out of the week with symptoms occurring part of the day.  Patient endorses the following depressive symptoms: decreased energy, crying spells, and symptoms of sadness, and lack of motivation.  Patient endorses improved concentration since taking Strattera.  In addition to her depression, patient endorses anxiety she rates as 6 out of 10.  Patient endorses stressors related to making sure she has somewhere to stay.  A PHQ-9 screen was performed with the patient scoring a 12.  A GAD-7 screen was also performed with the patient scoring at 13.  Visit Diagnosis:    ICD-10-CM   1. Generalized anxiety disorder  F41.1 hydrOXYzine (ATARAX) 50 MG tablet    hydrOXYzine (ATARAX) 25 MG tablet    FLUoxetine (PROZAC) 20 MG capsule    2. Insomnia due to other mental disorder  F51.05 hydrOXYzine (ATARAX) 50 MG tablet   F99     3. Attention and concentration deficit  R41.840 atomoxetine (STRATTERA) 40 MG capsule    4. MDD (major depressive disorder), recurrent severe, without psychosis (HCC)  F33.2 FLUoxetine (PROZAC) 20 MG capsule    5. PTSD (post-traumatic stress disorder)  F43.10 FLUoxetine (PROZAC) 20 MG capsule      Past Psychiatric History:  Patient has a past psychiatric history significant for major depressive disorder, PTSD, generalized anxiety disorder, and past suicidal ideations   Patient  has a past history of hospitalization due to mental health.  Patient was hospitalized last in December. Patient denies a past history of homicide  Past Medical History:  Past Medical History:  Diagnosis Date   Asthma    Depression    GERD (gastroesophageal reflux disease)    Obesity    Panic anxiety syndrome     Seasonal allergies    Vision abnormalities    Pt wears glasses    Past Surgical History:  Procedure Laterality Date   ADENOIDECTOMY     TONSILLECTOMY      Family Psychiatric History:  Mother - Depression Father - Attempted suicide Aunt (maternal) - Depression   Family history of suicide attempt: Patient reports that her father attempted suicide.  Patient reports that her aunt possibly attempted suicide Family history of homicide: Patient denies Family history of substance abuse: Patient reports that her father was an alcoholic  Family History:  Family History  Problem Relation Age of Onset   Depression Mother    Vision loss Mother    Hyperlipidemia Mother    Diabetes Mother    Cancer Mother    Alcohol abuse Father    Depression Father    Heart disease Father    Cancer Father     Social History:  Social History   Socioeconomic History   Marital status: Single    Spouse name: Not on file   Number of children: 0   Years of education: Not on file   Highest education level: Some college, no degree  Occupational History   Not on file  Tobacco Use   Smoking status: Some Days    Passive exposure: Yes   Smokeless tobacco: Never  Vaping Use   Vaping status: Never Used  Substance and Sexual Activity   Alcohol use: No   Drug use: Yes    Types: Marijuana   Sexual activity: Not Currently    Birth control/protection: None  Other Topics Concern   Not on file  Social History Narrative   Not on file   Social Determinants of Health   Financial Resource Strain: High Risk (01/15/2023)   Overall Financial Resource Strain (CARDIA)    Difficulty of Paying Living Expenses: Very hard  Food Insecurity: Food Insecurity Present (10/19/2022)   Hunger Vital Sign    Worried About Running Out of Food in the Last Year: Often true    Ran Out of Food in the Last Year: Often true  Transportation Needs: Unmet Transportation Needs (10/19/2022)   PRAPARE - Transportation    Lack of  Transportation (Medical): Yes    Lack of Transportation (Non-Medical): Yes  Physical Activity: Inactive (01/15/2023)   Exercise Vital Sign    Days of Exercise per Week: 0 days    Minutes of Exercise per Session: 0 min  Stress: Stress Concern Present (01/15/2023)   [employer] of Occupational Health - Occupational Stress Questionnaire    Feeling of Stress : Very much  Social Connections: Socially Isolated (01/15/2023)   Social Connection and Isolation Panel [NHANES]    Frequency of Communication with Friends and Family: Once a week    Frequency of Social Gatherings with Friends and Family: Once a week    Attends Religious Services: More than 4 times per year    Active Member of [employer] or Organizations: No    Attends Banker Meetings: Never    Marital Status: Never married    Allergies: No Known Allergies  Metabolic Disorder Labs: Lab Results  Component Value Date   HGBA1C 5.5 10/25/2022   MPG 111 10/25/2022   MPG 99.67 03/06/2022   No results found for: "PROLACTIN" Lab Results  Component Value Date   CHOL 185 10/20/2022   TRIG 94 10/20/2022   HDL 50 10/20/2022   CHOLHDL 3.7 10/20/2022   VLDL 19 10/20/2022   LDLCALC 116 (H) 10/20/2022   Lab Results  Component Value Date   TSH 2.026 10/20/2022   TSH 2.586 03/06/2022    Therapeutic Level Labs: No results found for: "LITHIUM" No results found for: "VALPROATE" No results found for: "CBMZ"  Current Medications: Current Outpatient Medications  Medication Sig Dispense Refill   albuterol (PROVENTIL HFA;VENTOLIN HFA) 108 (90 Base) MCG/ACT inhaler Inhale 1-2 puffs into the lungs every 4 (four) hours as needed for wheezing or shortness of breath. 3.7 g 0   atomoxetine (STRATTERA) 40 MG capsule Take 1 capsule (40 mg total) by mouth daily. 30 capsule 1   ferrous sulfate 325 (65 FE) MG tablet Take 1 tablet (325 mg total) by mouth daily with breakfast. For anemia 30 tablet 0   FLUoxetine (PROZAC) 20 MG capsule  Take 1 capsule (20 mg total) by mouth daily. For depression 30 capsule 1   hydrOXYzine (ATARAX) 25 MG tablet Take 1 tablet (25 mg total) by mouth 3 (three) times daily as needed for anxiety. 75 tablet 1   hydrOXYzine (ATARAX) 50 MG tablet Take 1 tablet (50 mg total) by mouth at bedtime as needed (Insomnia). 30 tablet 1   No current facility-administered medications for this visit.     Musculoskeletal: Strength & Muscle Tone: within normal limits Gait & Station: normal Patient leans: N/A  Psychiatric Specialty Exam: Review of Systems  Psychiatric/Behavioral:  Positive for sleep disturbance. Negative for decreased concentration, dysphoric mood, hallucinations, self-injury and suicidal ideas. The patient is nervous/anxious. The patient is not hyperactive.     unknown if currently breastfeeding.There is no height or weight on file to calculate BMI.  General Appearance: Well Groomed  Eye Contact:  Good  Speech:  Clear and Coherent and Normal Rate  Volume:  Normal  Mood:  Anxious and Depressed  Affect:  Congruent  Thought Process:  Coherent, Goal Directed, and Descriptions of Associations: Intact  Orientation:  Full (Time, Place, and Person)  Thought Content: WDL   Suicidal Thoughts:  No  Homicidal Thoughts:  No  Memory:  Immediate;   Good Recent;   Good Remote;   Good  Judgement:  Good  Insight:  Good  Psychomotor Activity:  Normal  Concentration:  Concentration: Good and Attention Span: Good  Recall:  Good  Fund of Knowledge: Good  Language: Good  Akathisia:  No  Handed:  Right  AIMS (if indicated): not done  Assets:  Communication Skills Desire for Improvement Housing Social Support Transportation Vocational/Educational  ADL's:  Intact  Cognition: WNL  Sleep:  Fair   Screenings: AIMS    Flowsheet Row [facility] Ad[facility] (Discharged[facility]  AIMS Total Score 0 0      AUDIT    Flowsheet Row Admission (Discharged) [facility] (Dis[facility] Admission (Discharged) from [facility]  Alcohol Use Disorder Identification Test Final Score (AUDIT) 1 1 0  GAD-7    Flowsheet Row Video Visit from 06/20/2023 in [facility] Office Visit from 01/17/2023 in [name] Counselor from 01/15/2023 in [facility] Office Visit [facility]  Total GAD-7 Score 13 13 21 16       PHQ2-9    Flowsheet Row Video Visit from 06/20/2023 in [facility] Office Visit from 01[facility]17/2023 in [facility] Counselor from 01/15/2023 in [facility] Counselor from 03/28/2022 in [facility] Counselor from 03/23/2022 in [facility]  PHQ-2 Total Score 2 4 6 5 4   PHQ-9 Total Score 12 16 25 20 21       Flowsheet Row Video Visit from 06/20/2023 in [facility] Office Visit from 01/17/2023 in [facility] Counselor from 01/15/2023 in [facility]  C-SSRS RISK CATEGORY Moderate Risk Moderate Risk Moderate Risk        Assessment and Plan:   Aronda Wiederholt is a 27 year old female with a past psychiatric history significant for generalized anxiety disorder, insomnia, attention and concentration deficit, major depressive disorder, and PTSD who presents to [facility] via virtual video visit for follow-up and medication management.  Patient  presents today encounter stating that she has only been taking her Strattera due to being informed by her pharmacy that Strattera was targeting the same symptoms as fluoxetine is.  Patient endorses improved concentration through the use of her Strattera; however, she continues to endorse symptoms of depression as well as anxiety.  Provider recommended patient being placed back on fluoxetine 20 mg daily for the management of her depressive symptoms and anxiety.  Patient was agreeable to recommendation.  Patient also requested to be placed back on hydroxyzine 25 mg 3 times daily as needed and hydroxyzine 50 mg at bedtime for the management of her anxiety and insomnia.  Patient's medications to be e-prescribed to pharmacy of choice.  Collaboration of Care: Collaboration of Care: Medication Management AEB provider managing patient's psychiatric medications, Psychiatrist AEB patient being followed by mental health provider at this facility, and Referral or follow-up with counselor/therapist AEB patient being followed by a licensed clinical social worker at this facility  Patient/Guardian was advised Release of Information must be obtained prior to any record release in order to collaborate their care with an outside provider. Patient/Guardian was advised if they have not already done so to contact the registration department to sign all necessary forms in order for [place] to release information regarding their care.   Consent: Patient/Guardian gives verbal consent for treatment and assignment of benefits for services provided during this visit. Patient/Guardian expressed understanding and agreed to proceed.   1. Generalized anxiety disorder  - hydrOXYzine (ATARAX) 50 MG tablet; Take 1 tablet (50 mg total) by mouth at bedtime as needed (Insomnia).  Dispense: 30 tablet; Refill: 1 - hydrOXYzine (ATARAX) 25 MG tablet; Take 1 tablet (25 mg total) by mouth 3 (three) times daily as needed for anxiety.  Dispense: 75  tablet; Refill: 1 - FLUoxetine (PROZAC) 20 MG capsule; Take 1 capsule (20 mg total) by mouth daily. For depression  Dispense: 30 capsule; Refill: 1  2. Insomnia due to other mental disorder  - hydrOXYzine (ATARAX) 50 MG tablet; Take 1 tablet (50 mg total) by mouth at bedtime as needed (Insomnia).  Dispense: 30 tablet; Refill: 1  3. Attention and concentration deficit  - atomoxetine (STRATTERA) 40 MG capsule; Take 1 capsule (40 mg total) by mouth daily.  Dispense: 30 capsule; Refill: 1  4. MDD (major depressive disorder), recurrent severe, without psychosis (HCC)  - FLUoxetine (PROZAC) 20 MG capsule; Take 1 capsule (20 mg total) by mouth daily. For depression  Dispense: 30 capsule; Refill: 1  5. PTSD (post-traumatic stress disorder)  - FLUoxetine (PROZAC) 20 MG capsule; Take 1 capsule (20 mg total) by mouth daily. For depression  Dispense: 30 capsule; Refill: 1  Patient to follow-up in 6 weeks Provider spent a total of 17 minutes with the patient/reviewing patient's chart  Meta Hatchet, PA 06/20/2023, 6:51 PM

## 2023-07-18 ENCOUNTER — Ambulatory Visit (INDEPENDENT_AMBULATORY_CARE_PROVIDER_SITE_OTHER): Payer: BLUE CROSS/BLUE SHIELD | Admitting: Mental Health

## 2023-07-18 DIAGNOSIS — F332 Major depressive disorder, recurrent severe without psychotic features: Secondary | ICD-10-CM | POA: Diagnosis not present

## 2023-07-18 DIAGNOSIS — F4310 Post-traumatic stress disorder, unspecified: Secondary | ICD-10-CM

## 2023-07-18 DIAGNOSIS — F411 Generalized anxiety disorder: Secondary | ICD-10-CM

## 2023-07-18 NOTE — Progress Notes (Signed)
Jamie Stevens PROGRESS NOTE Virtual Visit via Video Note  I connected with Jamie Stevens on 07/18/23 at  1:00 PM EDT by a video enabled telemedicine application and verified that I am speaking with the correct person using two identifiers.  Location: Patient: current home Provider: office   I discussed the limitations of evaluation and management by telemedicine and the availability of in person appointments. The patient expressed understanding and agreed to proceed.  I discussed the assessment and treatment plan with the patient. The patient was provided an opportunity to ask questions and all were answered. The patient agreed with the plan and demonstrated an understanding of the instructions.   The patient was advised to call back or seek an in-person evaluation if the symptoms worsen or if the condition fails to improve as anticipated.  I provided 53 minutes of non-face-to-face time during this encounter.   Jamie Stevens, [facility]   Session Time: 1:05 pm ( 53 minutes)  Participation Level: Active  Behavioral Response: Fairly GroomedAlertDysphoric  Type of Therapy: Individual Therapy  Treatment Goals addressed: STG: "Grief" Jamie Stevens will decrease sxs of depression AEB processing through feelings of grief in healthy balanced manner per self report within the next 90 days.    STG:"Manage stress, grace." Jamie Stevens will increase management of depressive sxs AEB development of x 3 effective emotional regulation and distress tolerance skills with ability to restructure maladaptive cognitions as needed within the next 90 days    ProgressTowards Goals: Progressing  Interventions: CBT and Supportive  Summary:  Jamie Stevens is a 27 y.o. female who presents with dx of major depression, recurrent severe; GAD, PTSD, unresolved grief and cannabis use. Presents alert and oriented; mood adequate; neutral. Notes moods to have been "up and down." Speech clear and coherent at normal rate  and tone. Engaged and receptive to interventions. Shares to have been able to move into a residence and renting a room from an older female. Shares for situation to not be ideal and having difficulty finding a place that will rent to her. Shares concerns for work and not feeling she has been trained property and was concern from losing her position. Shares ongoing feelings of frustration with family an dnot being able to live in the house in which her mother owned. Shares upcoming anniversary of her parents death and shares for this to be bringing up unresolved feelings. Shares thoughts on family and not feeling supported. Shares to have also let go of a relationship she feels non longer working for her with female in which she was pregnant with and lost the child. Shares ongoing thoughts of family members and not to be close with family members largely older. Explores with Jamie Stevens working through and processing feelings and working to find resolve. Shares thoughts of mother and processing feelings of grief. Notes working to manage feelings of depression with medications and working to give self grace and engaging in activities in which she enjoys sharing to have attended a concert. Denies SI/HI. Ongoing work towards goals. Denies SI/HI  Suicidal/Homicidal: Nowithout intent/plan  Jamie Stevens Response:  Jamie Stevens engaged Jamie Stevens in Jamie Stevens. Completed check in and assessed for current level of functioning, sxs management and level of stressors. Provided safe space for Jamie Stevens to share thoughts and concerns for current stressors. Provided supportive feedback Active empathic listening providing support and encouragement. Explored with Jamie Stevens hx of engagement with family, thoughts of mother and past relationship. Supported in processing thoughts in balanced manner and challenging maladaptive thoughts present.  Encouraged working to engage in Development worker, international aid thinking and working to make plan for realistic stressors  and encouraged saving money. Explored ability to engage in enjoyable activities and ability to manage low feelings. Reviewed session provided follow up and assessed for safety.  Plan: Return again in  x 5 weeks.  Diagnosis: MDD (major depressive disorder), recurrent severe, without psychosis (HCC)  Generalized anxiety disorder  PTSD (post-traumatic stress disorder)  Collaboration of Care: Other None  Patient/Guardian was advised Release of Information must be obtained prior to any record release in order to collaborate their care with an outside provider. Patient/Guardian was advised if they have not already done so to contact the registration department to sign all necessary forms in order for [place] to release information regarding their care.   Consent: Patient/Guardian gives verbal consent for treatment and assignment of benefits for services provided during this visit. Patient/Guardian expressed understanding and agreed to proceed.   Jamie Stevens [place], [facility] 07/18/2023

## 2023-07-19 ENCOUNTER — Ambulatory Visit (HOSPITAL_COMMUNITY)
Admission: EM | Admit: 2023-07-19 | Discharge: 2023-07-19 | Disposition: A | Discharge status: Home / Self Care | Payer: BLUE CROSS/BLUE SHIELD | Attending: Physician Assistant | Admitting: Physician Assistant

## 2023-07-19 ENCOUNTER — Encounter (HOSPITAL_COMMUNITY): Payer: Self-pay | Admitting: Emergency Medicine

## 2023-07-19 DIAGNOSIS — J069 Acute upper respiratory infection, unspecified: Secondary | ICD-10-CM | POA: Diagnosis not present

## 2023-07-19 DIAGNOSIS — Z1152 Encounter for screening for COVID-19: Secondary | ICD-10-CM | POA: Insufficient documentation

## 2023-07-19 DIAGNOSIS — J4521 Mild intermittent asthma with (acute) exacerbation: Secondary | ICD-10-CM | POA: Diagnosis not present

## 2023-07-19 MED ORDER — BUDESONIDE-FORMOTEROL FUMARATE 80-4.5 MCG/ACT IN AERO: 2.0000 | INHALATION_SPRAY | Freq: Two times a day (BID) | RESPIRATORY_TRACT | 0 refills | Status: DC | PRN

## 2023-07-19 MED ORDER — ALBUTEROL SULFATE HFA 108 (90 BASE) MCG/ACT IN AERS
2.0000 | INHALATION_SPRAY | Freq: Once | RESPIRATORY_TRACT | Status: AC
  Administered 2023-07-19: 2 via RESPIRATORY_TRACT

## 2023-07-19 MED ORDER — ALBUTEROL SULFATE HFA 108 (90 BASE) MCG/ACT IN AERS
INHALATION_SPRAY | RESPIRATORY_TRACT | Status: AC
  Filled 2023-07-19: qty 6.7

## 2023-07-19 MED ORDER — AEROCHAMBER PLUS FLO-VU LARGE MISC
Status: AC
  Filled 2023-07-19: qty 1

## 2023-07-19 MED ORDER — AEROCHAMBER PLUS FLO-VU LARGE MISC
1.0000 | Freq: Once | Status: AC
  Administered 2023-07-19: 1

## 2023-07-19 MED ORDER — PREDNISONE 20 MG PO TABS: 40.0000 mg | ORAL_TABLET | Freq: Every day | ORAL | 0 refills | Status: AC

## 2023-07-19 MED ORDER — PROMETHAZINE-DM 6.25-15 MG/5ML PO SYRP: 5.0000 mL | ORAL_SOLUTION | Freq: Three times a day (TID) | ORAL | 0 refills | Status: DC | PRN

## 2023-07-19 NOTE — ED Triage Notes (Signed)
Pt reports sinus congestion since Wed. Took Nyquil.

## 2023-07-19 NOTE — ED Provider Notes (Signed)
[facility]    CSN: [id number] Arrival date & time: 07/19/23  1731      History   Chief Complaint Chief Complaint  Patient presents with   Nasal Congestion    HPI Jamie Stevens is a 28 y.o. female.   Patient presents today with a several day history of URI symptoms.  She reports congestion, cough, chest tightness, nausea, subjective fever, chills, fatigue, malaise.  Denies any chest pain, shortness of breath, vomiting.  Denies any known sick contacts but did attend a concert recently.  She has had COVID several years ago.  She has had COVID-19 vaccines.  She does have a history of asthma but has not been using her albuterol inhaler that she does feel some tightness.  She was previously prescribed Symbicort but has not used this in several years.  Reports he was hospitalized as a child but not as an adult for asthma.  Denies any recent antibiotics or steroids.  She has no concern for pregnancy.  She has tried NyQuil over-the-counter without improvement of symptoms.    Past Medical History:  Diagnosis Date   Asthma    Depression    GERD (gastroesophageal reflux disease)    Obesity    Panic anxiety syndrome    Seasonal allergies    Vision abnormalities    Pt wears glasses    Patient Active Problem List   Diagnosis Date Noted   Insomnia due to other mental disorder 01/17/2023   Attention and concentration deficit 01/17/2023   Unresolved grief 01/15/2023   PTSD (post-traumatic stress disorder) 10/20/2022   Generalized anxiety disorder 10/20/2022   MDD (major depressive disorder), recurrent episode, severe (HCC) 10/19/2022   Cannabis abuse 03/09/2022   Allergic rhinitis 10/24/2021   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   History of suicide attempt 04/25/2021   MDD (major depressive disorder), recurrent severe, without psychosis (HCC)    Suicidal ideation    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   Binge eating disorder 02/20/2021   Anxiety  associated with depression 08/23/2015   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 Authorizing Provider  budesonide-formoterol (SYMBICORT) 80-4.5 MCG/ACT inhaler Inhale 2 puffs into the lungs 2 (two) times daily as needed. 07/19/23  Yes Pharrah Rottman K, PA-C  predniSONE (DELTASONE) 20 MG tablet Take 2 tablets (40 mg total) by mouth daily for 4 days. 07/19/23 07/23/23 Yes Dhriti Fales K, PA-C  promethazine-dextromethorphan (PROMETHAZINE-DM) 6.25-15 MG/5ML syrup Take 5 mLs by mouth 3 (three) times daily as needed for cough. 07/19/23  Yes Gwenevere Goga K, PA-C  albuterol (PROVENTIL HFA;VENTOLIN HFA) 108 (90 Base) MCG/ACT inhaler Inhale 1-2 puffs into the lungs  every 4 (four) hours as needed for wheezing or shortness of breath. 12/31/18   Cathie Hoops, Amy V, PA-C  atomoxetine (STRATTERA) 40 MG capsule Take 1 capsule (40 mg total) by mouth daily. 06/20/23   Nwoko, Tommas Olp, PA  FLUoxetine (PROZAC) 20 MG capsule Take 1 capsule (20 mg total) by mouth daily. For depression 06/20/23   Nwoko, Tommas Olp, PA  hydrOXYzine (ATARAX) 25 MG tablet Take 1 tablet (25 mg total) by mouth 3 (three) times daily as needed for anxiety. 06/20/23   Nwoko, Tommas Olp, PA  hydrOXYzine (ATARAX) 50 MG tablet Take 1 tablet (50 mg total) by mouth at bedtime as needed (Insomnia). 06/20/23   Meta Hatchet, PA    Family History Family History  Problem Relation Age of Onset   Depression Mother    Vision loss Mother     Hyperlipidemia Mother    Diabetes Mother    Cancer Mother    Alcohol abuse Father    Depression Father    Heart disease Father    Cancer Father     Social History Social History   Tobacco Use   Smoking status: Some Days    Passive exposure: Yes   Smokeless tobacco: Never  Vaping Use   Vaping status: Never Used  Substance Use Topics   Alcohol use: No   Drug use: Yes    Types: Marijuana     Allergies   Patient has no known allergies.   Review of Systems Review of Systems  Constitutional:  Positive for activity change, chills, fatigue and fever. Negative for appetite change.  HENT:  Positive for congestion and sore throat. Negative for sinus pressure and sneezing.   Respiratory:  Positive for cough. Negative for shortness of breath.   Cardiovascular:  Negative for chest pain.  Gastrointestinal:  Positive for nausea. Negative for abdominal pain, diarrhea and vomiting.  Musculoskeletal:  Negative for arthralgias and myalgias.     Physical Exam Triage Vital Signs ED Triage Vitals  Encounter Vitals Group     BP 07/19/23 1824 124/87     Systolic BP Percentile --      Diastolic BP Percentile --      Pulse Rate 07/19/23 1824 89     Resp 07/19/23 1824 20     Temp 07/19/23 1824 99.4 F (37.4 C)     Temp Source 07/19/23 1824 Oral     SpO2 07/19/23 1824 97 %     Weight --      Height --      Head Circumference --      Peak Flow --      Pain Score 07/19/23 1822 0     Pain Loc --      Pain Education --      Exclude from Growth Chart --    No data found.  Updated Vital Signs BP 124/87 (BP Location: Right Arm)   Pulse 89   Temp 99.4 F (37.4 C) (Oral)   Resp 20   LMP 06/19/2023 (Approximate)   SpO2 97%   Visual Acuity Right Eye Distance:   Left Eye Distance:   Bilateral Distance:    Right Eye Near:   Left Eye Near:    Bilateral Near:     Physical Exam Vitals reviewed.  Constitutional:      General: She is awake. She is not in acute distress.     Appearance: Normal appearance. She is well-developed. She is not ill-appearing.     Comments: Very pleasant  female appears stated age in no acute distress sitting comfortably in exam room curled up in a blanket  HENT:     Head: Normocephalic and atraumatic.     Right Ear: Tympanic membrane, ear canal and external ear normal. Tympanic membrane is not erythematous or bulging.     Left Ear: Tympanic membrane, ear canal and external ear normal. Tympanic membrane is not erythematous or bulging.     Nose:     Right Sinus: No maxillary sinus tenderness or frontal sinus tenderness.     Left Sinus: No maxillary sinus tenderness or frontal sinus tenderness.     Mouth/Throat:     Pharynx: Uvula midline. Postnasal drip present. No oropharyngeal exudate or posterior oropharyngeal erythema.  Cardiovascular:     Rate and Rhythm: Normal rate and regular rhythm.     Heart sounds: Normal heart sounds, S1 normal and S2 normal. No murmur heard. Pulmonary:     Effort: Pulmonary effort is normal.     Breath sounds: Examination of the right-lower field reveals decreased breath sounds. Examination of the left-lower field reveals decreased breath sounds. Decreased breath sounds present. No wheezing, rhonchi or rales.  Psychiatric:        Behavior: Behavior is cooperative.      UC Treatments / Results  Labs (all labs ordered are listed, but only abnormal results are displayed) Labs Reviewed  SARS CORONAVIRUS 2 (TAT 6-24 HRS)    EKG   Radiology No results found.  Procedures Procedures (including critical care time)  Medications Ordered in UC Medications  albuterol (VENTOLIN HFA) 108 (90 Base) MCG/ACT inhaler 2 puff (2 puffs Inhalation Given 07/19/23 1940)  AeroChamber Plus Flo-Vu Large MISC 1 each (1 each Other Given 07/19/23 1941)    Initial Impression / Assessment and Plan / UC Course  I have reviewed the triage vital signs and the nursing notes.  Pertinent labs & imaging results that were  available during my care of the patient were reviewed by me and considered in my medical decision making (see chart for details).     Patient is well-appearing, afebrile, nontoxic, nontachycardic.  She did initially have decreased aeration of bilateral bases but this improved following a dose of albuterol in clinic.  No evidence of acute infection on physical exam that would warrant initiation of antibiotics.  Discussed likely viral etiology.  COVID testing was obtained and is pending.  She is a candidate for antiviral therapy given her history of asthma but would need to hold her Strattera as well as hydroxyzine while on this medication for 3 days after completing course.  Last metabolic panel was obtained October 20, 2022 but she has no history of chronic kidney disease and at that point her creatinine was 0.82 with a GFR of greater than 60 mL/min.  I am concerned that the illness has exacerbated her asthma given her symptoms.  She was given Symbicort with instruction to rinse her mouth following use of this medication as well as a steroid burst.  She is not to take NSAIDs with this medication.  Can use over-the-counter medication including Mucinex, Flonase, Tylenol.  Recommend that she rest and drink plenty of fluid.  If her symptoms or not improving within a week she is to return for reevaluation.  If she has any worsening symptoms she needs to be seen immediately.  Strict return precautions given.  Work excuse note provided.  Final Clinical Impressions(s) / UC Diagnoses   Final diagnoses:  Upper respiratory tract infection, unspecified type  Mild intermittent asthma with acute exacerbation     Discharge Instructions      I am concerned that you have a upper respiratory infection that is causing a flare of your asthma.  Use your albuterol every 4-6 hours as needed.  Start Symbicort twice daily.  Rinse your mouth following use of this medication to prevent thrush.  Use Promethazine DM for cough.   This will make you sleepy so do not drive or drink alcohol taking it.  I will call you if you are positive for COVID.  Use prednisone 40 mg for 4 days.  Do not take NSAIDs with this medication to risk of GI bleeding.  This includes aspirin, ibuprofen/Advil, naproxen/Aleve.  If your symptoms are not improving within a week please return for reevaluation.  If anything worsens you need to be seen immediately including high fever, worsening cough, shortness of breath, chest pain, nausea/vomiting.     ED Prescriptions     Medication Sig Dispense Auth. Provider   promethazine-dextromethorphan (PROMETHAZINE-DM) 6.25-15 MG/5ML syrup Take 5 mLs by mouth 3 (three) times daily as needed for cough. 118 mL Lekha Dancer K, PA-C   budesonide-formoterol (SYMBICORT) 80-4.5 MCG/ACT inhaler Inhale 2 puffs into the lungs 2 (two) times daily as needed. 1 each Shamere Campas K, PA-C   predniSONE (DELTASONE) 20 MG tablet Take 2 tablets (40 mg total) by mouth daily for 4 days. 8 tablet Andreana Klingerman, Noberto Retort, PA-C      PDMP not reviewed this encounter.   Jeani Hawking, PA-C 07/19/23 2025

## 2023-07-19 NOTE — Discharge Instructions (Signed)
I am concerned that you have a upper respiratory infection that is causing a flare of your asthma.  Use your albuterol every 4-6 hours as needed.  Start Symbicort twice daily.  Rinse your mouth following use of this medication to prevent thrush.  Use Promethazine DM for cough.  This will make you sleepy so do not drive or drink alcohol taking it.  I will call you if you are positive for COVID.  Use prednisone 40 mg for 4 days.  Do not take NSAIDs with this medication to risk of GI bleeding.  This includes aspirin, ibuprofen/Advil, naproxen/Aleve.  If your symptoms are not improving within a week please return for reevaluation.  If anything worsens you need to be seen immediately including high fever, worsening cough, shortness of breath, chest pain, nausea/vomiting.

## 2023-07-20 LAB — SARS CORONAVIRUS 2 (TAT 6-24 HRS): SARS Coronavirus 2: NEGATIVE

## 2023-08-01 ENCOUNTER — Encounter (HOSPITAL_COMMUNITY): Payer: Self-pay

## 2023-08-01 ENCOUNTER — Telehealth (INDEPENDENT_AMBULATORY_CARE_PROVIDER_SITE_OTHER): Payer: BLUE CROSS/BLUE SHIELD | Admitting: Physician Assistant

## 2023-08-01 DIAGNOSIS — F4310 Post-traumatic stress disorder, unspecified: Secondary | ICD-10-CM

## 2023-08-01 DIAGNOSIS — F411 Generalized anxiety disorder: Secondary | ICD-10-CM | POA: Diagnosis not present

## 2023-08-01 DIAGNOSIS — F332 Major depressive disorder, recurrent severe without psychotic features: Secondary | ICD-10-CM | POA: Diagnosis not present

## 2023-08-01 DIAGNOSIS — F5105 Insomnia due to other mental disorder: Secondary | ICD-10-CM

## 2023-08-01 DIAGNOSIS — R41840 Attention and concentration deficit: Secondary | ICD-10-CM

## 2023-08-01 DIAGNOSIS — F99 Mental disorder, not otherwise specified: Secondary | ICD-10-CM

## 2023-08-01 NOTE — Progress Notes (Addendum)
BH MD/PA/NP OP Progress Note  Virtual Visit via Video Note  I connected with Jamie Stevens on 08/04/23 at 10:30 AM EDT by a video enabled telemedicine application and verified that I am speaking with the correct person using two identifiers.  Location: Patient: Home Provider: Clinic   I discussed the limitations of evaluation and management by telemedicine and the availability of in person appointments. The patient expressed understanding and agreed to proceed.  Follow Up Instructions:   I discussed the assessment and treatment plan with the patient. The patient was provided an opportunity to ask questions and all were answered. The patient agreed with the plan and demonstrated an understanding of the instructions.   The patient was advised to call back or seek an in-person evaluation if the symptoms worsen or if the condition fails to improve as anticipated.  I provided 15 minutes of non-face-to-face time during this encounter.  Meta Hatchet, PA    08/04/2023 2:46 PM Jamie Stevens  MRN:  [id number]  Chief Complaint:  No chief complaint on file.  HPI:   Jamie Stevens is a 27 year old female with a past psychiatric history significant for generalized anxiety disorder, insomnia, attention and concentration deficit, major depressive disorder, and PTSD who presents to [facility] via virtual video visit for follow-up and medication management.  Patient is currently being managed on the following psychiatric medications:  Fluoxetine 20 mg daily Hydroxyzine 25 mg 3 times daily as needed Hydroxyzine 50 mg at bedtime Strattera 40 mg daily  Patient reports that she occasionally misses a couple of doses of her medications and notices changes in her mood when this happens.  She reports that her depressive symptoms are not debilitating; however, there are some days where her symptoms are noticeable especially due to the changes in her  workload and upcoming anniversary of her parent's passing.  Patient endorses depression and rates her depression as 5 or 6 out of 10 with 10 being most severe.  Patient endorses experiencing depressive episodes 1-2 times per week.  Patient endorses the following depressive symptoms: feelings of sadness, fatigue, increased sleep, and self-isolation.  Patient also endorses anxiety stating that her level of anxiety is comparable to her level of depression.  Patient's current stressors include attempting to acquire her mother's house.  She reports that this has been difficult because there are still family members that still live in the space.  In regards to her medication, patient reports that Wilhemena Durie has been good; however, she has noticed that when she misses the medication, she notices changes in her mood.  Patient also reports that she thinks that Strattera makes her sleepy.  A PHQ-9 screen was performed with the patient scoring a 9.  A GAD-7 screen was also performed with the patient scoring a 10.  Patient is alert and oriented x 4, calm, cooperative, and fully engaged in conversation during the encounter.  Patient endorses indifferent mood.  Patient denies suicidal or homicidal ideations.  She further denies auditory or visual hallucinations and does not appear to be responding to internal/external stimuli.  Patient endorses fair sleep and receives on average 6-1/2 to 7 hours of sleep per night.  Patient endorses good appetite and eats on average 2 meals per day along with snacking.  Patient denies alcohol consumption, tobacco use, or illicit drug use.  Visit Diagnosis:    ICD-10-CM   1. Attention and concentration deficit  R41.840 atomoxetine (STRATTERA) 40 MG capsule  2. Generalized anxiety disorder  F41.1 FLUoxetine (PROZAC) 20 MG capsule    hydrOXYzine (ATARAX) 25 MG tablet    hydrOXYzine (ATARAX) 50 MG tablet    3. MDD (major depressive disorder), recurrent severe, without psychosis (HCC)   F33.2 FLUoxetine (PROZAC) 20 MG capsule    4. PTSD (post-traumatic stress disorder)  F43.10 FLUoxetine (PROZAC) 20 MG capsule    5. Insomnia due to other mental disorder  F51.05 hydrOXYzine (ATARAX) 50 MG tablet   F99        Past Psychiatric History:  Patient has a past psychiatric history significant for major depressive disorder, PTSD, generalized anxiety disorder, and past suicidal ideations   Patient has a past history of hospitalization due to mental health.  Patient was hospitalized last in December. Patient denies a past history of homicide  Past Medical History:  Past Medical History:  Diagnosis Date   Asthma    Depression    GERD (gastroesophageal reflux disease)    Obesity    Panic anxiety syndrome    Seasonal allergies    Vision abnormalities    Pt wears glasses    Past Surgical History:  Procedure Laterality Date   ADENOIDECTOMY     TONSILLECTOMY      Family Psychiatric History:  Mother - Depression Father - Attempted suicide Aunt (maternal) - Depression   Family history of suicide attempt: Patient reports that her father attempted suicide.  Patient reports that her aunt possibly attempted suicide Family history of homicide: Patient denies Family history of substance abuse: Patient reports that her father was an alcoholic  Family History:  Family History  Problem Relation Age of Onset   Depression Mother    Vision loss Mother    Hyperlipidemia Mother    Diabetes Mother    Cancer Mother    Alcohol abuse Father    Depression Father    Heart disease Father    Cancer Father     Social History:  Social History   Socioeconomic History   Marital status: Single    Spouse name: Not on file   Number of children: 0   Years of education: Not on file   Highest education level: Some college, no degree  Occupational History   Not on file  Tobacco Use   Smoking status: Some Days    Passive exposure: Yes   Smokeless tobacco: Never  Vaping Use    Vaping status: Never Used  Substance and Sexual Activity   Alcohol use: No   Drug use: Yes    Types: Marijuana   Sexual activity: Not Currently    Birth control/protection: None  Other Topics Concern   Not on file  Social History Narrative   Not on file   Social Determinants of Health   Financial Resource Strain: High Risk (01/15/2023)   Overall Financial Resource Strain (CARDIA)    Difficulty of Paying Living Expenses: Very hard  Food Insecurity: Not on File (07/25/2023)   Received from [facility] Insecurity    Food: 0  Transportation Needs: Unmet Transportation Needs (10/19/2022)   PRAPARE - Transportation    Lack of Transportation (Medical): Yes    Lack of Transportation (Non-Medical): Yes  Physical Activity: Inactive (01/15/2023)   Exercise Vital Sign    Days of Exercise per Week: 0 days    Minutes of Exercise per Session: 0 min  Stress: Stress Concern Present (01/15/2023)   [employer] of Occupational Health - Occupational Stress Questionnaire    Feeling of Stress :  Very much  Social Connections: Not on File (07/13/2023)   [facility] from [facility]   Social Connections    Connectedness: 0    Allergies: No Known Allergies  Metabolic Disorder Labs: Lab Results  Component Value Date   HGBA1C 5.5 10/25/2022   MPG 111 10/25/2022   MPG 99.67 03/06/2022   No results found for: "PROLACTIN" Lab Results  Component Value Date   CHOL 185 10/20/2022   TRIG 94 10/20/2022   HDL 50 10/20/2022   CHOLHDL 3.7 10/20/2022   VLDL 19 10/20/2022   LDLCALC 116 (H) 10/20/2022   Lab Results  Component Value Date   TSH 2.026 10/20/2022   TSH 2.586 03/06/2022    Therapeutic Level Labs: No results found for: "LITHIUM" No results found for: "VALPROATE" No results found for: "CBMZ"  Current Medications: Current Outpatient Medications  Medication Sig Dispense Refill   albuterol (PROVENTIL HFA;VENTOLIN HFA) 108 (90 Base) MCG/ACT inhaler Inhale 1-2 puffs into the lungs every 4  (four) hours as needed for wheezing or shortness of breath. 3.7 g 0   atomoxetine (STRATTERA) 40 MG capsule Take 1 capsule (40 mg total) by mouth daily. 30 capsule 1   budesonide-formoterol (SYMBICORT) 80-4.5 MCG/ACT inhaler Inhale 2 puffs into the lungs 2 (two) times daily as needed. 1 each 0   FLUoxetine (PROZAC) 20 MG capsule Take 1 capsule (20 mg total) by mouth daily. For depression 30 capsule 1   hydrOXYzine (ATARAX) 25 MG tablet Take 1 tablet (25 mg total) by mouth 3 (three) times daily as needed for anxiety. 75 tablet 1   hydrOXYzine (ATARAX) 50 MG tablet Take 1 tablet (50 mg total) by mouth at bedtime as needed (Insomnia). 30 tablet 1   promethazine-dextromethorphan (PROMETHAZINE-DM) 6.25-15 MG/5ML syrup Take 5 mLs by mouth 3 (three) times daily as needed for cough. 118 mL 0   No current facility-administered medications for this visit.     Musculoskeletal: Strength & Muscle Tone: within normal limits Gait & Station: normal Patient leans: N/A  Psychiatric Specialty Exam: Review of Systems  Psychiatric/Behavioral:  Positive for sleep disturbance. Negative for decreased concentration, dysphoric mood, hallucinations, self-injury and suicidal ideas. The patient is nervous/anxious. The patient is not hyperactive.     Last menstrual period 06/19/2023, unknown if currently breastfeeding.There is no height or weight on file to calculate BMI.  General Appearance: Well Groomed  Eye Contact:  Good  Speech:  Clear and Coherent and Normal Rate  Volume:  Normal  Mood:  Anxious and Depressed  Affect:  Congruent  Thought Process:  Coherent, Goal Directed, and Descriptions of Associations: Intact  Orientation:  Full (Time, Place, and Person)  Thought Content: WDL   Suicidal Thoughts:  No  Homicidal Thoughts:  No  Memory:  Immediate;   Good Recent;   Good Remote;   Good  Judgement:  Good  Insight:  Good  Psychomotor Activity:  Normal  Concentration:  Concentration: Good and Attention  Span: Good  Recall:  Good  Fund of Knowledge: Good  Language: Good  Akathisia:  No  Handed:  Right  AIMS (if indicated): not done  Assets:  Communication Skills Desire for Improvement Housing Social Support Transportation Vocational/Educational  ADL's:  Intact  Cognition: WNL  Sleep:  Fair   Screenings: AIMS    Flowsheet Row [facility] Ad[facility] (Discharged[facility]  AIMS Total Score 0 0  AUDIT    Flowsheet Row Admission (Discharged) [facility] (Dis[facility] Admission (Discharged) from [facility]  Alcohol Use Disorder Identification Test Final Score (AUDIT) 1 1 0      GAD-7    Flowsheet Row Video Visit from 08/01/2023 in [facility] Video Visit from 06/20/2023 in [facility] Office Visit from 01/17/2023 in [facility] Counselor from 01/15/2023 in [facility] Office Visit [facility]  Total GAD-7 Score 10 13 13 21 16       PHQ2-9    Flowsheet Row Video Visit from 08/01/2023 in [facility] Video Visit from 06/20/2023 in [facility] Office Visit from 01/17/2023 in [facility] Counselor from 01/15/2023 in [facility] Counselor from 03/28/2022 in [facility]  PHQ-2 Total Score 3 2 4 6 5   PHQ-9 Total Score 9 12 16 25 20       Flowsheet Row Video Visit from 08/01/2023 in [facility]  ED from 07/19/2023 in [facility] Video Visit from 06/20/2023 in [facility]  C-SSRS RISK CATEGORY Moderate Risk No Risk Moderate Risk        Assessment and Plan:   Jamie Stevens is a 27 year old female with a past psychiatric history significant for generalized anxiety disorder, insomnia, attention and concentration deficit, major depressive disorder, and PTSD who presents to [name] via virtual video visit for follow-up and medication management.  Patient presents to the encounter stating that her medication management has been going well.  She reports that she occasionally misses a couple of doses during the week and notices changes in her mood when this happens.  Patient states that her depression has not been debilitating as of late; however, patient reports that her depression is most noticeable due to changes in her work load and the upcoming anniversary of her parent's passing.  In regards to her use of Strattera, patient reports that the medication continues to be helpful; however, she reports that the medication makes her somewhat sleepy.  Patient endorses stability on her current medication regimen and would like to continue taking her medications as prescribed.  Patient's medications to be e-prescribed to pharmacy of choice.  Collaboration of Care: Collaboration of Care: Medication Management AEB provider managing patient's psychiatric medications, Psychiatrist AEB patient being followed by mental health provider at this facility, and Referral or follow-up with counselor/therapist AEB patient being followed by a licensed clinical social worker at this facility  Patient/Guardian was advised Release of Information must be obtained prior to any record release in order to collaborate their care with an outside provider. Patient/Guardian was advised if they have not already done so to contact the  registration department to sign all necessary forms in order for [place] to release information regarding their care.   Consent: Patient/Guardian gives verbal consent for treatment and assignment of benefits for services provided during this visit. Patient/Guardian expressed understanding and agreed to proceed.   1. Attention and concentration deficit  - atomoxetine (STRATTERA) 40 MG capsule; Take 1 capsule (40 mg total) by mouth daily.  Dispense: 30 capsule; Refill:  1  2. Generalized anxiety disorder  - FLUoxetine (PROZAC) 20 MG capsule; Take 1 capsule (20 mg total) by mouth daily. For depression  Dispense: 30 capsule; Refill: 1 - hydrOXYzine (ATARAX) 25 MG tablet; Take 1 tablet (25 mg total) by mouth 3 (three) times daily as needed for anxiety.  Dispense: 75 tablet; Refill: 1 - hydrOXYzine (ATARAX) 50 MG tablet; Take 1 tablet (50 mg total) by mouth at bedtime as needed (Insomnia).  Dispense: 30 tablet; Refill: 1  3. MDD (major depressive disorder), recurrent severe, without psychosis (HCC)  - FLUoxetine (PROZAC) 20 MG capsule; Take 1 capsule (20 mg total) by mouth daily. For depression  Dispense: 30 capsule; Refill: 1  4. PTSD (post-traumatic stress disorder)  - FLUoxetine (PROZAC) 20 MG capsule; Take 1 capsule (20 mg total) by mouth daily. For depression  Dispense: 30 capsule; Refill: 1  5. Insomnia due to other mental disorder  - hydrOXYzine (ATARAX) 50 MG tablet; Take 1 tablet (50 mg total) by mouth at bedtime as needed (Insomnia).  Dispense: 30 tablet; Refill: 1  Patient to follow-up in 6 weeks Provider spent a total of 15 minutes with the patient/reviewing patient's chart  Meta Hatchet, PA 08/04/2023, 2:46 PM

## 2023-08-04 ENCOUNTER — Encounter (HOSPITAL_COMMUNITY): Payer: Self-pay | Admitting: Physician Assistant

## 2023-08-04 MED ORDER — ATOMOXETINE HCL 40 MG PO CAPS: 40.0000 mg | ORAL_CAPSULE | Freq: Every day | ORAL | 1 refills | Status: DC

## 2023-08-04 MED ORDER — HYDROXYZINE HCL 50 MG PO TABS: 50.0000 mg | ORAL_TABLET | Freq: Every evening | ORAL | 1 refills | Status: DC | PRN

## 2023-08-04 MED ORDER — HYDROXYZINE HCL 25 MG PO TABS: 25.0000 mg | ORAL_TABLET | Freq: Three times a day (TID) | ORAL | 1 refills | Status: DC | PRN

## 2023-08-04 MED ORDER — FLUOXETINE HCL 20 MG PO CAPS: 20.0000 mg | ORAL_CAPSULE | Freq: Every day | ORAL | 1 refills | Status: DC

## 2023-08-07 DIAGNOSIS — F332 Major depressive disorder, recurrent severe without psychotic features: Secondary | ICD-10-CM | POA: Diagnosis not present

## 2023-08-15 DIAGNOSIS — F332 Major depressive disorder, recurrent severe without psychotic features: Secondary | ICD-10-CM | POA: Diagnosis not present

## 2023-08-22 DIAGNOSIS — F332 Major depressive disorder, recurrent severe without psychotic features: Secondary | ICD-10-CM | POA: Diagnosis not present

## 2023-08-29 DIAGNOSIS — F332 Major depressive disorder, recurrent severe without psychotic features: Secondary | ICD-10-CM | POA: Diagnosis not present

## 2023-08-30 ENCOUNTER — Telehealth (HOSPITAL_COMMUNITY): Payer: Self-pay | Admitting: Mental Health

## 2023-08-30 ENCOUNTER — Encounter (HOSPITAL_COMMUNITY): Payer: Self-pay

## 2023-08-30 ENCOUNTER — Ambulatory Visit (HOSPITAL_COMMUNITY): Payer: Self-pay | Admitting: Mental Health

## 2023-08-30 NOTE — Telephone Encounter (Signed)
Therapist sent tele-therapy link x 2; no response after x 10 minutes. Contact pt via telephone no answer. Left HIPAA compliant message to reschedule. NS

## 2023-09-05 DIAGNOSIS — F332 Major depressive disorder, recurrent severe without psychotic features: Secondary | ICD-10-CM | POA: Diagnosis not present

## 2023-09-11 DIAGNOSIS — F332 Major depressive disorder, recurrent severe without psychotic features: Secondary | ICD-10-CM | POA: Diagnosis not present

## 2023-09-12 ENCOUNTER — Telehealth (HOSPITAL_COMMUNITY): Payer: Self-pay | Admitting: Physician Assistant

## 2023-09-12 ENCOUNTER — Encounter (HOSPITAL_COMMUNITY): Payer: Self-pay

## 2023-09-23 DIAGNOSIS — F332 Major depressive disorder, recurrent severe without psychotic features: Secondary | ICD-10-CM | POA: Diagnosis not present

## 2023-09-24 ENCOUNTER — Telehealth (HOSPITAL_COMMUNITY): Payer: BLUE CROSS/BLUE SHIELD | Admitting: Physician Assistant

## 2023-09-24 DIAGNOSIS — F332 Major depressive disorder, recurrent severe without psychotic features: Secondary | ICD-10-CM

## 2023-09-24 DIAGNOSIS — F411 Generalized anxiety disorder: Secondary | ICD-10-CM

## 2023-09-24 DIAGNOSIS — R41840 Attention and concentration deficit: Secondary | ICD-10-CM | POA: Diagnosis not present

## 2023-09-24 DIAGNOSIS — F4310 Post-traumatic stress disorder, unspecified: Secondary | ICD-10-CM | POA: Diagnosis not present

## 2023-09-25 ENCOUNTER — Encounter (HOSPITAL_COMMUNITY): Payer: Self-pay | Admitting: Physician Assistant

## 2023-09-25 MED ORDER — HYDROXYZINE HCL 25 MG PO TABS: 25.0000 mg | ORAL_TABLET | Freq: Three times a day (TID) | ORAL | 1 refills | Status: DC | PRN

## 2023-09-25 MED ORDER — FLUOXETINE HCL 20 MG PO CAPS: 20.0000 mg | ORAL_CAPSULE | Freq: Every day | ORAL | 1 refills | Status: DC

## 2023-09-25 MED ORDER — ATOMOXETINE HCL 40 MG PO CAPS: 40.0000 mg | ORAL_CAPSULE | Freq: Every day | ORAL | 1 refills | Status: DC

## 2023-09-25 NOTE — Progress Notes (Unsigned)
BH MD/PA/NP OP Progress Note  Virtual Visit via Video Note  I connected with Jamie Stevens on 09/25/23 at  4:00 PM EST by a video enabled telemedicine application and verified that I am speaking with the correct person using two identifiers.  Location: Patient: Home Provider: Clinic   I discussed the limitations of evaluation and management by telemedicine and the availability of in person appointments. The patient expressed understanding and agreed to proceed.  Follow Up Instructions:   I discussed the assessment and treatment plan with the patient. The patient was provided an opportunity to ask questions and all were answered. The patient agreed with the plan and demonstrated an understanding of the instructions.   The patient was advised to call back or seek an in-person evaluation if the symptoms worsen or if the condition fails to improve as anticipated.  I provided 13 minutes of non-face-to-face time during this encounter.  Jamie Hatchet, PA    09/25/2023 10:26 AM Jamie Stevens  MRN:  [id number]  Chief Complaint:  Chief Complaint  Patient presents with   Follow-up   Medication Refill   HPI:   Jamie Stevens is a 27 year old female with a past psychiatric history significant for generalized anxiety disorder, insomnia, attention and concentration deficit, major depressive disorder, and PTSD who presents to [facility] via virtual video visit for follow-up and medication management.  Patient is currently being managed on the following psychiatric medications:  Fluoxetine 20 mg daily Hydroxyzine 25 mg 3 times daily as needed Hydroxyzine 50 mg at bedtime Strattera 40 mg daily  Patient presents to the encounter stating that she does not take hydroxyzine but reports that she is okay with taking one of her prescriptions.  Patient reports that she is not a happy medium with her medications.  Patient endorses depression and  attributes her depression to the changing of the seasons and the anniversary of her parent's passing.  Patient endorses depression and rates her depression at 3 out of 10 with 10 being most severe.  Patient endorses depressive episodes 1-2 times per week.  Patient endorses the following depressive symptoms: decreased interest in activities/hobbies, lack of motivation, feelings of sadness, crying spells, and feelings of guilt/worthlessness.  Patient denies decreased concentration or hopelessness.  Patient endorses slight anxiety and rates her anxiety at 3 out of 10.  Patient's current stressors include "adulting" and trying to maintain her mother's house.  A PHQ-9 screen was performed with the patient scoring a 10.  A GAD-7 screen was also performed with the patient scoring a 6.  Patient is alert and oriented x 4, calm, cooperative, and fully engaged in conversation during the encounter.  Patient endorses neutral mood.  Patient denies suicidal or homicidal ideations.  She further denies auditory or visual hallucinations and does not appear to be responding to internal/external stimuli.  Patient endorses fair sleep and receives on average 6 hours of sleep per night.  Patient endorses fair appetite and eats on average 2 meals per day.  Patient denies alcohol consumption, tobacco use, or illicit drug use.  Visit Diagnosis:    ICD-10-CM   1. Generalized anxiety disorder  F41.1 hydrOXYzine (ATARAX) 25 MG tablet    FLUoxetine (PROZAC) 20 MG capsule    2. Attention and concentration deficit  R41.840 atomoxetine (STRATTERA) 40 MG capsule    3. MDD (major depressive disorder), recurrent severe, without psychosis (HCC)  F33.2 FLUoxetine (PROZAC) 20 MG capsule    4. PTSD (post-traumatic  stress disorder)  F43.10 FLUoxetine (PROZAC) 20 MG capsule       Past Psychiatric History:  Patient has a past psychiatric history significant for major depressive disorder, PTSD, generalized anxiety disorder, and past  suicidal ideations   Patient has a past history of hospitalization due to mental health.  Patient was hospitalized last in December. Patient denies a past history of homicide  Past Medical History:  Past Medical History:  Diagnosis Date   Asthma    Depression    GERD (gastroesophageal reflux disease)    Obesity    Panic anxiety syndrome    Seasonal allergies    Vision abnormalities    Pt wears glasses    Past Surgical History:  Procedure Laterality Date   ADENOIDECTOMY     TONSILLECTOMY      Family Psychiatric History:  Mother - Depression Father - Attempted suicide Aunt (maternal) - Depression   Family history of suicide attempt: Patient reports that her father attempted suicide.  Patient reports that her aunt possibly attempted suicide Family history of homicide: Patient denies Family history of substance abuse: Patient reports that her father was an alcoholic  Family History:  Family History  Problem Relation Age of Onset   Depression Mother    Vision loss Mother    Hyperlipidemia Mother    Diabetes Mother    Cancer Mother    Alcohol abuse Father    Depression Father    Heart disease Father    Cancer Father     Social History:  Social History   Socioeconomic History   Marital status: Single    Spouse name: Not on file   Number of children: 0   Years of education: Not on file   Highest education level: Some college, no degree  Occupational History   Not on file  Tobacco Use   Smoking status: Some Days    Passive exposure: Yes   Smokeless tobacco: Never  Vaping Use   Vaping status: Never Used  Substance and Sexual Activity   Alcohol use: No   Drug use: Yes    Types: Marijuana   Sexual activity: Not Currently    Birth control/protection: None  Other Topics Concern   Not on file  Social History Narrative   Not on file   Social Determinants of Health   Financial Resource Strain: High Risk (01/15/2023)   Overall Financial Resource Strain  (CARDIA)    Difficulty of Paying Living Expenses: Very hard  Food Insecurity: Not on File (07/25/2023)   Received from [facility] Insecurity    Food: 0  Transportation Needs: Unmet Transportation Needs (10/19/2022)   PRAPARE - Transportation    Lack of Transportation (Medical): Yes    Lack of Transportation (Non-Medical): Yes  Physical Activity: Inactive (01/15/2023)   Exercise Vital Sign    Days of Exercise per Week: 0 days    Minutes of Exercise per Session: 0 min  Stress: Stress Concern Present (01/15/2023)   [employer] of Occupational Health - Occupational Stress Questionnaire    Feeling of Stress : Very much  Social Connections: Not on File (07/13/2023)   Received from [facility]   Social Connections    Connectedness: 0    Allergies: No Known Allergies  Metabolic Disorder Labs: Lab Results  Component Value Date   HGBA1C 5.5 10/25/2022   MPG 111 10/25/2022   MPG 99.67 03/06/2022   No results found for: "PROLACTIN" Lab Results  Component Value Date   CHOL 185 10/20/2022  TRIG 94 10/20/2022   HDL 50 10/20/2022   CHOLHDL 3.7 10/20/2022   VLDL 19 10/20/2022   LDLCALC 116 (H) 10/20/2022   Lab Results  Component Value Date   TSH 2.026 10/20/2022   TSH 2.586 03/06/2022    Therapeutic Level Labs: No results found for: "LITHIUM" No results found for: "VALPROATE" No results found for: "CBMZ"  Current Medications: Current Outpatient Medications  Medication Sig Dispense Refill   albuterol (PROVENTIL HFA;VENTOLIN HFA) 108 (90 Base) MCG/ACT inhaler Inhale 1-2 puffs into the lungs every 4 (four) hours as needed for wheezing or shortness of breath. 3.7 g 0   atomoxetine (STRATTERA) 40 MG capsule Take 1 capsule (40 mg total) by mouth daily. 30 capsule 1   budesonide-formoterol (SYMBICORT) 80-4.5 MCG/ACT inhaler Inhale 2 puffs into the lungs 2 (two) times daily as needed. 1 each 0   FLUoxetine (PROZAC) 20 MG capsule Take 1 capsule (20 mg total) by mouth daily. For  depression 30 capsule 1   hydrOXYzine (ATARAX) 25 MG tablet Take 1 tablet (25 mg total) by mouth 3 (three) times daily as needed for anxiety. 75 tablet 1   promethazine-dextromethorphan (PROMETHAZINE-DM) 6.25-15 MG/5ML syrup Take 5 mLs by mouth 3 (three) times daily as needed for cough. 118 mL 0   No current facility-administered medications for this visit.     Musculoskeletal: Strength & Muscle Tone: within normal limits Gait & Station: normal Patient leans: N/A  Psychiatric Specialty Exam: Review of Systems  Psychiatric/Behavioral:  Positive for dysphoric mood and sleep disturbance. Negative for decreased concentration, hallucinations, self-injury and suicidal ideas. The patient is nervous/anxious. The patient is not hyperactive.     unknown if currently breastfeeding.There is no height or weight on file to calculate BMI.  General Appearance: Well Groomed  Eye Contact:  Good  Speech:  Clear and Coherent and Normal Rate  Volume:  Normal  Mood:  Anxious and Depressed  Affect:  Appropriate  Thought Process:  Coherent, Goal Directed, and Descriptions of Associations: Intact  Orientation:  Full (Time, Place, and Person)  Thought Content: WDL   Suicidal Thoughts:  No  Homicidal Thoughts:  No  Memory:  Immediate;   Good Recent;   Good Remote;   Good  Judgement:  Good  Insight:  Good  Psychomotor Activity:  Normal  Concentration:  Concentration: Good and Attention Span: Good  Recall:  Good  Fund of Knowledge: Good  Language: Good  Akathisia:  No  Handed:  Right  AIMS (if indicated): not done  Assets:  Communication Skills Desire for Improvement Housing Social Support Transportation Vocational/Educational  ADL's:  Intact  Cognition: WNL  Sleep:  Fair   Screenings: AIMS    Flowsheet Row [facility] Admission (Discharged) from [facility]  AIMS Total Score 0 0      AUDIT    Flowsheet Row Admission (Discharged) from [facility] Ad[facility] (Discharged[facility] Admission (Discharged) from OP [facility]  Alcohol Use Disorder Identification Test Final Score (AUDIT) 1 1 0      GAD-7    Flowsheet Row Video Visit from 09/24/2023 in [facility] Video Visit from 08/01/2023 in [facility] Video Visit from 06/20/2023 in [facility] Office Visit  from 01/17/2023 in [facility] Counselor from 01/15/2023 in [facility]  Total GAD-7 Score 6 10 13 13 21       PHQ2-9    Flowsheet Row Video Visit from 09/24/2023 in [facility] Video Visit from 08/01/2023 in [facility] Video Visit from 06/20/2023 in [facility] Office Visit from 01/17/2023 in [name] Counselor from 01/15/2023 in [facility]  PHQ-2 Total Score 2 3 2 4 6   PHQ-9 Total Score 10 9 12 16 25       Flowsheet Row Video Visit from 09/24/2023 in [facility] Video Visit from 08/01/2023 in [facility] ED from 07/19/2023 in [facility] RISK CATEGORY Moderate Risk Moderate Risk No Risk        Assessment and Plan:   Emilyn Avera is a 27 year old female with a past psychiatric history significant for generalized anxiety disorder, insomnia, attention and concentration deficit, major depressive disorder, and PTSD who presents to [facility] via virtual video visit for follow-up and medication  management.  Patient presents to the encounter stating that she has not been taking her hydroxyzine prescription.  Patient is interested in taking one of her prescriptions of hydroxyzine.  Provider to place patient on hydroxyzine 25 mg 3 times daily as needed for the management of her anxiety.  In regards to her other medications, patient reports no issues or concerns.  Patient continues to endorse some depression attributed to the changing of the seasons and the anniversary of her parents' passing.  Patient also endorses some anxiety.  Though patient continues to endorse ongoing depression and anxiety, patient would like to continue taking her medications as prescribed.  Patient's medications to be e-prescribed to pharmacy of choice.  Collaboration of Care: Collaboration of Care: Medication Management AEB provider managing patient's psychiatric medications, Psychiatrist AEB patient being followed by mental health provider at this facility, and Referral or follow-up with counselor/therapist AEB patient being followed by a licensed clinical social worker at this facility  Patient/Guardian was advised Release of Information must be obtained prior to any record release in order to collaborate their care with an outside provider. Patient/Guardian was advised if they have not already done so to contact the registration department to sign all necessary forms in order for [place] to release information regarding their care.   Consent: Patient/Guardian gives verbal consent for treatment and assignment of benefits for services provided during this visit. Patient/Guardian expressed understanding and agreed to proceed.   1. Generalized anxiety disorder  - hydrOXYzine (ATARAX) 25 MG tablet; Take 1 tablet (25 mg total) by mouth 3 (three) times daily as needed for anxiety.  Dispense: 75 tablet; Refill: 1 - FLUoxetine (PROZAC) 20 MG capsule; Take 1 capsule (20 mg total) by mouth daily. For depression  Dispense: 30 capsule;  Refill: 1  2. Attention and concentration deficit  - atomoxetine (STRATTERA) 40 MG capsule; Take 1 capsule (40 mg total) by mouth daily.  Dispense: 30 capsule; Refill: 1  3. MDD (major depressive disorder), recurrent severe, without psychosis (HCC)  - FLUoxetine (PROZAC) 20 MG capsule; Take 1 capsule (20 mg total) by mouth daily. For depression  Dispense: 30 capsule; Refill: 1  4. PTSD (post-traumatic stress disorder)  - FLUoxetine (PROZAC) 20 MG capsule; Take 1 capsule (20 mg total) by mouth daily. For depression  Dispense: 30 capsule; Refill: 1   Patient to follow-up in 2 months Provider spent a total of 13 minutes with the patient/reviewing patient's chart  Jamie Hatchet, PA 09/25/2023, 10:26 AM

## 2023-10-02 DIAGNOSIS — F332 Major depressive disorder, recurrent severe without psychotic features: Secondary | ICD-10-CM | POA: Diagnosis not present

## 2023-10-09 DIAGNOSIS — F332 Major depressive disorder, recurrent severe without psychotic features: Secondary | ICD-10-CM | POA: Diagnosis not present

## 2023-10-13 DIAGNOSIS — M791 Myalgia, unspecified site: Secondary | ICD-10-CM | POA: Diagnosis not present

## 2023-10-13 DIAGNOSIS — R509 Fever, unspecified: Secondary | ICD-10-CM | POA: Diagnosis not present

## 2023-10-13 DIAGNOSIS — N898 Other specified noninflammatory disorders of vagina: Secondary | ICD-10-CM | POA: Diagnosis not present

## 2023-10-13 DIAGNOSIS — N760 Acute vaginitis: Secondary | ICD-10-CM | POA: Diagnosis not present

## 2023-10-13 DIAGNOSIS — R0602 Shortness of breath: Secondary | ICD-10-CM | POA: Diagnosis not present

## 2023-10-13 DIAGNOSIS — R110 Nausea: Secondary | ICD-10-CM | POA: Diagnosis not present

## 2023-10-13 DIAGNOSIS — R102 Pelvic and perineal pain: Secondary | ICD-10-CM | POA: Diagnosis not present

## 2023-10-13 DIAGNOSIS — R52 Pain, unspecified: Secondary | ICD-10-CM | POA: Diagnosis not present

## 2023-10-13 DIAGNOSIS — Z20822 Contact with and (suspected) exposure to covid-19: Secondary | ICD-10-CM | POA: Diagnosis not present

## 2023-10-13 DIAGNOSIS — Z113 Encounter for screening for infections with a predominantly sexual mode of transmission: Secondary | ICD-10-CM | POA: Diagnosis not present

## 2023-10-16 DIAGNOSIS — F332 Major depressive disorder, recurrent severe without psychotic features: Secondary | ICD-10-CM | POA: Diagnosis not present

## 2023-10-21 DIAGNOSIS — F332 Major depressive disorder, recurrent severe without psychotic features: Secondary | ICD-10-CM | POA: Diagnosis not present

## 2023-10-30 DIAGNOSIS — F332 Major depressive disorder, recurrent severe without psychotic features: Secondary | ICD-10-CM | POA: Diagnosis not present

## 2023-11-08 DIAGNOSIS — F332 Major depressive disorder, recurrent severe without psychotic features: Secondary | ICD-10-CM | POA: Diagnosis not present

## 2023-11-13 DIAGNOSIS — F332 Major depressive disorder, recurrent severe without psychotic features: Secondary | ICD-10-CM | POA: Diagnosis not present

## 2023-11-21 ENCOUNTER — Encounter (HOSPITAL_COMMUNITY): Payer: Self-pay | Admitting: Physician Assistant

## 2023-11-21 ENCOUNTER — Telehealth (INDEPENDENT_AMBULATORY_CARE_PROVIDER_SITE_OTHER): Payer: BLUE CROSS/BLUE SHIELD | Admitting: Physician Assistant

## 2023-11-21 DIAGNOSIS — F4310 Post-traumatic stress disorder, unspecified: Secondary | ICD-10-CM

## 2023-11-21 DIAGNOSIS — F332 Major depressive disorder, recurrent severe without psychotic features: Secondary | ICD-10-CM | POA: Diagnosis not present

## 2023-11-21 DIAGNOSIS — R41840 Attention and concentration deficit: Secondary | ICD-10-CM | POA: Diagnosis not present

## 2023-11-21 DIAGNOSIS — F411 Generalized anxiety disorder: Secondary | ICD-10-CM | POA: Diagnosis not present

## 2023-11-21 MED ORDER — HYDROXYZINE HCL 25 MG PO TABS: 25.0000 mg | ORAL_TABLET | Freq: Three times a day (TID) | ORAL | 1 refills | Status: AC | PRN

## 2023-11-21 MED ORDER — ATOMOXETINE HCL 40 MG PO CAPS: 40.0000 mg | ORAL_CAPSULE | Freq: Every day | ORAL | 2 refills | Status: DC

## 2023-11-21 MED ORDER — FLUOXETINE HCL 20 MG PO CAPS: 20.0000 mg | ORAL_CAPSULE | Freq: Every day | ORAL | 2 refills | Status: DC

## 2023-11-21 NOTE — Progress Notes (Signed)
BH MD/PA/NP OP Progress Note  Virtual Visit via Video Note  I connected with Jamie Stevens on 11/21/23 at  4:00 PM EST by a video enabled telemedicine application and verified that I am speaking with the correct person using two identifiers.  Location: Patient: Home Provider: Clinic   I discussed the limitations of evaluation and management by telemedicine and the availability of in person appointments. The patient expressed understanding and agreed to proceed.  Follow Up Instructions:   I discussed the assessment and treatment plan with the patient. The patient was provided an opportunity to ask questions and all were answered. The patient agreed with the plan and demonstrated an understanding of the instructions.   The patient was advised to call back or seek an in-person evaluation if the symptoms worsen or if the condition fails to improve as anticipated.  I provided 10 minutes of non-face-to-face time during this encounter.  Meta Hatchet, PA    11/21/2023 7:11 PM Jamie Stevens  MRN:  [id number]  Chief Complaint:  Chief Complaint  Patient presents with   Follow-up   Medication Refill   HPI:   Jamie Stevens is a 28 year old female with a past psychiatric history significant for generalized anxiety disorder, insomnia, attention and concentration deficit, major depressive disorder, and PTSD who presents to [facility] via virtual video visit for follow-up and medication management.  Patient is currently being managed on the following psychiatric medications:  Fluoxetine 20 mg daily Hydroxyzine 25 mg 3 times daily as needed Hydroxyzine 50 mg at bedtime Strattera 40 mg daily  Patient reports no issues or concerns regarding her current medication regimen.  Patient denies experiencing any adverse side effects from her use of her medications.  She reports that when she takes her atomoxetine, she starts to feel the effects within 2  to 3 hours.  Patient states that the medication lasts roughly 6 to 7 hours out of time.  In regards to side effects, patient endorses insomnia on occasion when taking her atomoxetine.  Patient endorses some depression but states that her symptoms are mostly manageable.  She endorses minimal anxiety and rates her anxiety at 3 out of 10.  Patient's main stressor revolves around recently moving from [facility] to [employer].  A GAD-7 screen was performed with the patient scoring a 5.  Patient is alert and oriented x 4, calm, cooperative, and fully engaged in conversation during the encounter.  Patient endorses neutral mood.  Patient exhibits depressed mood with appropriate affect.  Patient denies suicidal or homicidal ideations.  She further denies auditory or visual hallucinations and does not appear to be responding to internal/external stimuli.  Patient endorses good sleep when not struggling with insomnia and receives on average 7 hours of sleep per night.  Patient endorses fair appetite and eats on average 2 meals per day.  Patient denies alcohol consumption or tobacco use.  Patient endorses illicit drug use in the form of marijuana.  Visit Diagnosis:    ICD-10-CM   1. Attention and concentration deficit  R41.840 atomoxetine (STRATTERA) 40 MG capsule    2. Generalized anxiety disorder  F41.1 hydrOXYzine (ATARAX) 25 MG tablet    FLUoxetine (PROZAC) 20 MG capsule    3. MDD (major depressive disorder), recurrent severe, without psychosis (HCC)  F33.2 FLUoxetine (PROZAC) 20 MG capsule    4. PTSD (post-traumatic stress disorder)  F43.10 FLUoxetine (PROZAC) 20 MG capsule       Past Psychiatric History:  Patient has a past psychiatric history significant for major depressive disorder, PTSD, generalized anxiety disorder, and past suicidal ideations   Patient has a past history of hospitalization due to mental health.  Patient was hospitalized last in December. Patient denies a past history of  homicide  Past Medical History:  Past Medical History:  Diagnosis Date   Asthma    Depression    GERD (gastroesophageal reflux disease)    Obesity    Panic anxiety syndrome    Seasonal allergies    Vision abnormalities    Pt wears glasses    Past Surgical History:  Procedure Laterality Date   ADENOIDECTOMY     TONSILLECTOMY      Family Psychiatric History:  Mother - Depression Father - Attempted suicide Aunt (maternal) - Depression   Family history of suicide attempt: Patient reports that her father attempted suicide.  Patient reports that her aunt possibly attempted suicide Family history of homicide: Patient denies Family history of substance abuse: Patient reports that her father was an alcoholic  Family History:  Family History  Problem Relation Age of Onset   Depression Mother    Vision loss Mother    Hyperlipidemia Mother    Diabetes Mother    Cancer Mother    Alcohol abuse Father    Depression Father    Heart disease Father    Cancer Father     Social History:  Social History   Socioeconomic History   Marital status: Single    Spouse name: Not on file   Number of children: 0   Years of education: Not on file   Highest education level: Some college, no degree  Occupational History   Not on file  Tobacco Use   Smoking status: Some Days    Passive exposure: Yes   Smokeless tobacco: Never  Vaping Use   Vaping status: Never Used  Substance and Sexual Activity   Alcohol use: No   Drug use: Yes    Types: Marijuana   Sexual activity: Not Currently    Birth control/protection: None  Other Topics Concern   Not on file  Social History Narrative   Not on file   Social Drivers of Health   Financial Resource Strain: High Risk (01/15/2023)   Overall Financial Resource Strain (CARDIA)    Difficulty of Paying Living Expenses: Very hard  Food Insecurity: Not on File (07/25/2023)   Received from [facility] Insecurity    Food: 0  Transportation  Needs: Unmet Transportation Needs (10/19/2022)   PRAPARE - Transportation    Lack of Transportation (Medical): Yes    Lack of Transportation (Non-Medical): Yes  Physical Activity: Inactive (01/15/2023)   Exercise Vital Sign    Days of Exercise per Week: 0 days    Minutes of Exercise per Session: 0 min  Stress: Stress Concern Present (01/15/2023)   [employer] of Occupational Health - Occupational Stress Questionnaire    Feeling of Stress : Very much  Social Connections: Not on File (07/13/2023)   Received from [facility]   Social Connections    Connectedness: 0    Allergies: No Known Allergies  Metabolic Disorder Labs: Lab Results  Component Value Date   HGBA1C 5.5 10/25/2022   MPG 111 10/25/2022   MPG 99.67 03/06/2022   No results found for: "PROLACTIN" Lab Results  Component Value Date   CHOL 185 10/20/2022   TRIG 94 10/20/2022   HDL 50 10/20/2022   CHOLHDL 3.7 10/20/2022   VLDL 19  10/20/2022   LDLCALC 116 (H) 10/20/2022   Lab Results  Component Value Date   TSH 2.026 10/20/2022   TSH 2.586 03/06/2022    Therapeutic Level Labs: No results found for: "LITHIUM" No results found for: "VALPROATE" No results found for: "CBMZ"  Current Medications: Current Outpatient Medications  Medication Sig Dispense Refill   albuterol (PROVENTIL HFA;VENTOLIN HFA) 108 (90 Base) MCG/ACT inhaler Inhale 1-2 puffs into the lungs every 4 (four) hours as needed for wheezing or shortness of breath. 3.7 g 0   atomoxetine (STRATTERA) 40 MG capsule Take 1 capsule (40 mg total) by mouth daily. 30 capsule 2   budesonide-formoterol (SYMBICORT) 80-4.5 MCG/ACT inhaler Inhale 2 puffs into the lungs 2 (two) times daily as needed. 1 each 0   FLUoxetine (PROZAC) 20 MG capsule Take 1 capsule (20 mg total) by mouth daily. For depression 30 capsule 2   hydrOXYzine (ATARAX) 25 MG tablet Take 1 tablet (25 mg total) by mouth 3 (three) times daily as needed for anxiety. 75 tablet 1    promethazine-dextromethorphan (PROMETHAZINE-DM) 6.25-15 MG/5ML syrup Take 5 mLs by mouth 3 (three) times daily as needed for cough. 118 mL 0   No current facility-administered medications for this visit.     Musculoskeletal: Strength & Muscle Tone: within normal limits Gait & Station: normal Patient leans: N/A  Psychiatric Specialty Exam: Review of Systems  Psychiatric/Behavioral:  Positive for dysphoric mood and sleep disturbance. Negative for decreased concentration, hallucinations, self-injury and suicidal ideas. The patient is nervous/anxious. The patient is not hyperactive.     unknown if currently breastfeeding.There is no height or weight on file to calculate BMI.  General Appearance: Well Groomed  Eye Contact:  Good  Speech:  Clear and Coherent and Normal Rate  Volume:  Normal  Mood:  Anxious and Depressed  Affect:  Appropriate  Thought Process:  Coherent, Goal Directed, and Descriptions of Associations: Intact  Orientation:  Full (Time, Place, and Person)  Thought Content: WDL   Suicidal Thoughts:  No  Homicidal Thoughts:  No  Memory:  Immediate;   Good Recent;   Good Remote;   Good  Judgement:  Good  Insight:  Good  Psychomotor Activity:  Normal  Concentration:  Concentration: Good and Attention Span: Good  Recall:  Good  Fund of Knowledge: Good  Language: Good  Akathisia:  No  Handed:  Right  AIMS (if indicated): not done  Assets:  Communication Skills Desire for Improvement Housing Social Support Transportation Vocational/Educational  ADL's:  Intact  Cognition: WNL  Sleep:  Fair   Screenings: AIMS    Flowsheet Row [facility] Ad[facility] (Discharged[facility] from [facility]  AIMS Total Score 0 0      AUDIT    Flowsheet Row Admission (Discharged) [facility] (Dis[facility] Admission (Discharged) from OP [facility]  Alcohol Use Disorder Identification Test Final Score (AUDIT) 1 1 0      GAD-7    Flowsheet Row Video Visit from 11/21/2023 in [facility] Video Visit from 09/24/2023 in [facility] Video Visit from 08/01/2023 in [facility] Video Visit from 06/20/2023 in [facility] Office Visit from 01/17/2023 in [facility]  Center  Total GAD-7 Score 5 6 10 13 13       PHQ2-9    Flowsheet Row Video Visit from 11/21/2023 in [facility] Video Visit from 09/24/2023 in [facility] Video Visit from 08/01/2023 in [facility] Video Visit from 06/20/2023 in [facility] Office Visit from 01/17/2023 in [facility]  PHQ-2 Total Score 1 2 3 2 4   PHQ-9 Total Score -- 10 9 12 16       Flowsheet Row Video Visit from 11/21/2023 in [facility] Video Visit from 09/24/2023 in [facility] Video Visit from 08/01/2023 in [facility]  C-SSRS RISK CATEGORY Moderate Risk Moderate Risk Moderate Risk        Assessment and Plan:   Jamie Stevens is a 28 year old female with a past psychiatric history significant for generalized anxiety disorder, insomnia, attention and concentration deficit, major depressive disorder, and PTSD who presents to [facility] via virtual video visit for follow-up and medication management.  Patient presents to the encounter reporting no issues or concerns regarding her use of her current medication regimen.  She  reports that she takes her medications regularly and states that she may experience some insomnia when using her atomoxetine.  Despite experiencing instances of insomnia, patient states that her sleep tends to be good.  Patient endorses minimal depressive symptoms and states that her anxiety is mostly manageable.  Patient would like to continue taking her medications as prescribed.  Patient's medications to be e-prescribed to pharmacy of choice.  Collaboration of Care: Collaboration of Care: Medication Management AEB provider managing patient's psychiatric medications, Psychiatrist AEB patient being followed by mental health provider at this facility, and Referral or follow-up with counselor/therapist AEB patient being followed by a licensed clinical social worker at this facility  Patient/Guardian was advised Release of Information must be obtained prior to any record release in order to collaborate their care with an outside provider. Patient/Guardian was advised if they have not already done so to contact the registration department to sign all necessary forms in order for [place] to release information regarding their care.   Consent: Patient/Guardian gives verbal consent for treatment and assignment of benefits for services provided during this visit. Patient/Guardian expressed understanding and agreed to proceed.   1. Attention and concentration deficit  - atomoxetine (STRATTERA) 40 MG capsule; Take 1 capsule (40 mg total) by mouth daily.  Dispense: 30 capsule; Refill: 2  2. Generalized anxiety disorder  - hydrOXYzine (ATARAX) 25 MG tablet; Take 1 tablet (25 mg total) by mouth 3 (three) times daily as needed for anxiety.  Dispense: 75 tablet; Refill: 1 - FLUoxetine (PROZAC) 20 MG capsule; Take 1 capsule (20 mg total) by mouth daily. For depression  Dispense: 30 capsule; Refill: 2  3. MDD (major depressive disorder), recurrent severe, without psychosis (HCC)  - FLUoxetine (PROZAC) 20 MG capsule;  Take 1 capsule (20 mg total) by mouth daily. For depression  Dispense: 30 capsule; Refill: 2  4. PTSD (post-traumatic stress disorder)  - FLUoxetine (PROZAC) 20 MG capsule; Take 1 capsule (20 mg total) by mouth daily. For depression  Dispense: 30 capsule; Refill: 2  Patient to follow-up in 2 months Provider spent a total of 10 minutes with the patient/reviewing patient's chart  Meta Hatchet, PA 11/21/2023, 7:11 PM

## 2023-12-04 DIAGNOSIS — F332 Major depressive disorder, recurrent severe without psychotic features: Secondary | ICD-10-CM | POA: Diagnosis not present

## 2023-12-09 DIAGNOSIS — R051 Acute cough: Secondary | ICD-10-CM | POA: Diagnosis not present

## 2023-12-09 DIAGNOSIS — J40 Bronchitis, not specified as acute or chronic: Secondary | ICD-10-CM | POA: Diagnosis not present

## 2023-12-11 DIAGNOSIS — F332 Major depressive disorder, recurrent severe without psychotic features: Secondary | ICD-10-CM | POA: Diagnosis not present

## 2023-12-18 DIAGNOSIS — F332 Major depressive disorder, recurrent severe without psychotic features: Secondary | ICD-10-CM | POA: Diagnosis not present

## 2024-01-02 DIAGNOSIS — F332 Major depressive disorder, recurrent severe without psychotic features: Secondary | ICD-10-CM | POA: Diagnosis not present

## 2024-01-09 DIAGNOSIS — F332 Major depressive disorder, recurrent severe without psychotic features: Secondary | ICD-10-CM | POA: Diagnosis not present

## 2024-01-15 DIAGNOSIS — F332 Major depressive disorder, recurrent severe without psychotic features: Secondary | ICD-10-CM | POA: Diagnosis not present

## 2024-01-18 DIAGNOSIS — R0609 Other forms of dyspnea: Secondary | ICD-10-CM | POA: Diagnosis not present

## 2024-01-18 DIAGNOSIS — F17210 Nicotine dependence, cigarettes, uncomplicated: Secondary | ICD-10-CM | POA: Diagnosis not present

## 2024-01-18 DIAGNOSIS — F1290 Cannabis use, unspecified, uncomplicated: Secondary | ICD-10-CM | POA: Diagnosis not present

## 2024-01-18 DIAGNOSIS — R0789 Other chest pain: Secondary | ICD-10-CM | POA: Diagnosis not present

## 2024-01-18 DIAGNOSIS — R0602 Shortness of breath: Secondary | ICD-10-CM | POA: Diagnosis not present

## 2024-01-18 DIAGNOSIS — R079 Chest pain, unspecified: Secondary | ICD-10-CM | POA: Diagnosis not present

## 2024-02-20 ENCOUNTER — Encounter (HOSPITAL_COMMUNITY): Payer: Self-pay | Admitting: Physician Assistant

## 2024-02-20 ENCOUNTER — Telehealth (INDEPENDENT_AMBULATORY_CARE_PROVIDER_SITE_OTHER): Payer: BLUE CROSS/BLUE SHIELD | Admitting: Physician Assistant

## 2024-02-20 DIAGNOSIS — F411 Generalized anxiety disorder: Secondary | ICD-10-CM

## 2024-02-20 DIAGNOSIS — F332 Major depressive disorder, recurrent severe without psychotic features: Secondary | ICD-10-CM

## 2024-02-20 DIAGNOSIS — R41840 Attention and concentration deficit: Secondary | ICD-10-CM

## 2024-02-20 DIAGNOSIS — F4310 Post-traumatic stress disorder, unspecified: Secondary | ICD-10-CM

## 2024-02-20 NOTE — Progress Notes (Signed)
 BH MD/PA/NP OP Progress Note  Virtual Visit via Video Note  I connected with Jamie Stevens on 02/20/24 at  4:00 PM EDT by a video enabled telemedicine application and verified that I am speaking with the correct person using two identifiers.  Location: Patient: Home Provider: Clinic   I discussed the limitations of evaluation and management by telemedicine and the availability of in person appointments. The patient expressed understanding and agreed to proceed.  Follow Up Instructions:   I discussed the assessment and treatment plan with the patient. The patient was provided an opportunity to ask questions and all were answered. The patient agreed with the plan and demonstrated an understanding of the instructions.   The patient was advised to call back or seek an in-person evaluation if the symptoms worsen or if the condition fails to improve as anticipated.  I provided 24 minutes of non-face-to-face time during this encounter.  Gates Kasal, PA    02/20/2024 6:07 PM Jamie Stevens  MRN:  [id number]  Chief Complaint:  Chief Complaint  Patient presents with   Follow-up   HPI:   Jamie Stevens is a 28 year old female with a past psychiatric history significant for generalized anxiety disorder, insomnia, attention and concentration deficit, major depressive disorder, and PTSD who presents to [facility] via virtual video visit for follow-up and medication management.  Patient is currently being managed on the following psychiatric medications:  Fluoxetine  20 mg daily Hydroxyzine  25 mg 3 times daily as needed Hydroxyzine  50 mg at bedtime Strattera  40 mg daily  Patient presents to the encounter stating that she found out today that she is pregnant.  Patient is unsure of how many weeks she is pregnant.  Provider informed patient that her use of hydroxyzine  is contraindicated during early pregnancy.  Provider also informed patient that  she may need to discontinue her use of Prozac  due to her pregnancy.  Provider informed patient that Zoloft is considered safe to use during pregnancy.  Patient reports that she has been on Zoloft in the past but is unsure if the medication was beneficial or not.  Patient reports no issues or concerns at this time.  She endorses some depression due to some external factors (anniversary of her mother's passing) but states that it has not been as debilitating.  Patient rates her depression as 3 out of 10 with 10 being most severe.  Patient endorses sporadic depressive episodes throughout the week.  Patient endorses the following depressive symptoms: difficulty getting out of bed and lack of motivation.  Patient also endorses anxiety and rates her anxiety as 3 or 4 out of 10.  Patient's anxiety is attributed to recently starting a new job.  A PHQ-9 screen was performed with the patient scoring a 5.  A GAD-7 screen was also performed with the patient scoring a 7.  Patient reports that she has been taking her atomoxetine  regularly.  She denies experiencing any adverse side effects when taking her atomoxetine .  She reports that when she takes her medication, it kicks in roughly 2 to 2-1/2 hours.  She reports that the medication last roughly 7 to 8 hours before wearing all.  She reports that the medication continues to help with her focus and concentration.  Patient is alert and oriented x 4, calm, cooperative, and fully engaged in conversation during the encounter.  Patient describes her mood as overwhelmed.  Patient exhibits depressed mood with appropriate affect.  Patient denies suicidal or  homicidal ideation.  She further denies auditory or visual hallucinations and does not appear to be responding to internal/external stimuli.  Patient endorses fair sleep and receives on average 6-1/2 to 7-1/2 hours of sleep per night.  Patient endorses fluctuating appetite and eats on average 3 meals per day.  Patient denies  alcohol consumption or illicit drug use.  Patient denies illicit drug use but states that she was previously using marijuana.  Visit Diagnosis:    ICD-10-CM   1. Attention and concentration deficit  R41.840     2. Generalized anxiety disorder  F41.1 sertraline (ZOLOFT) 50 MG tablet    3. MDD (major depressive disorder), recurrent severe, without psychosis (HCC)  F33.2 sertraline (ZOLOFT) 50 MG tablet    4. PTSD (post-traumatic stress disorder)  F43.10 sertraline (ZOLOFT) 50 MG tablet       Past Psychiatric History:  Patient has a past psychiatric history significant for major depressive disorder, PTSD, generalized anxiety disorder, and past suicidal ideations   Patient has a past history of hospitalization due to mental health.  Patient was hospitalized last in December. Patient denies a past history of homicide  Past Medical History:  Past Medical History:  Diagnosis Date   Asthma    Depression    GERD (gastroesophageal reflux disease)    Obesity    Panic anxiety syndrome    Seasonal allergies    Vision abnormalities    Pt wears glasses    Past Surgical History:  Procedure Laterality Date   ADENOIDECTOMY     TONSILLECTOMY      Family Psychiatric History:  Mother - Depression Father - Attempted suicide Aunt (maternal) - Depression   Family history of suicide attempt: Patient reports that her father attempted suicide.  Patient reports that her aunt possibly attempted suicide Family history of homicide: Patient denies Family history of substance abuse: Patient reports that her father was an alcoholic  Family History:  Family History  Problem Relation Age of Onset   Depression Mother    Vision loss Mother    Hyperlipidemia Mother    Diabetes Mother    Cancer Mother    Alcohol abuse Father    Depression Father    Heart disease Father    Cancer Father     Social History:  Social History   Socioeconomic History   Marital status: Single    Spouse name: Not  on file   Number of children: 0   Years of education: Not on file   Highest education level: Some college, no degree  Occupational History   Not on file  Tobacco Use   Smoking status: Some Days    Passive exposure: Yes   Smokeless tobacco: Never  Vaping Use   Vaping status: Never Used  Substance and Sexual Activity   Alcohol use: No   Drug use: Yes    Types: Marijuana   Sexual activity: Not Currently    Birth control/protection: None  Other Topics Concern   Not on file  Social History Narrative   Not on file   Social Drivers of Health   Financial Resource Strain: High Risk (01/15/2023)   Overall Financial Resource Strain (CARDIA)    Difficulty of Paying Living Expenses: Very hard  Food Insecurity: Low Risk  (01/18/2024)   Received from [facility]   Hunger Vital Sign    Worried About Running Out of Food in the Last Year: Never true    Ran Out of Food in the Last Year: Never true  Transportation Needs: No Transportation Needs (01/18/2024)   Received from [facility]   Transportation    In the past 12 months, has lack of reliable transportation kept you from medical appointments, meetings, work or from getting things needed for daily living? : No  Physical Activity: Inactive (01/15/2023)   Exercise Vital Sign    Days of Exercise per Week: 0 days    Minutes of Exercise per Session: 0 min  Stress: Stress Concern Present (01/15/2023)   [employer] of Occupational Health - Occupational Stress Questionnaire    Feeling of Stress : Very much  Social Connections: Not on File (07/13/2023)   Received from [facility]   Social Connections    Connectedness: 0    Allergies: No Known Allergies  Metabolic Disorder Labs: Lab Results  Component Value Date   HGBA1C 5.5 10/25/2022   MPG 111 10/25/2022   MPG 99.67 03/06/2022   No results found for: "PROLACTIN" Lab Results  Component Value Date   CHOL 185 10/20/2022   TRIG 94 10/20/2022   HDL 50 10/20/2022   CHOLHDL 3.7  10/20/2022   VLDL 19 10/20/2022   LDLCALC 116 (H) 10/20/2022   Lab Results  Component Value Date   TSH 2.026 10/20/2022   TSH 2.586 03/06/2022    Therapeutic Level Labs: No results found for: "LITHIUM" No results found for: "VALPROATE" No results found for: "CBMZ"  Current Medications: Current Outpatient Medications  Medication Sig Dispense Refill   sertraline (ZOLOFT) 50 MG tablet Take 1 tablet (50 mg total) by mouth daily. 30 tablet 1   albuterol  (PROVENTIL  HFA;VENTOLIN  HFA) 108 (90 Base) MCG/ACT inhaler Inhale 1-2 puffs into the lungs every 4 (four) hours as needed for wheezing or shortness of breath. 3.7 g 0   atomoxetine  (STRATTERA ) 40 MG capsule Take 1 capsule (40 mg total) by mouth daily. 30 capsule 2   budesonide -formoterol  (SYMBICORT ) 80-4.5 MCG/ACT inhaler Inhale 2 puffs into the lungs 2 (two) times daily as needed. 1 each 0   FLUoxetine  (PROZAC ) 20 MG capsule Take 1 capsule (20 mg total) by mouth daily. For depression 30 capsule 2   hydrOXYzine  (ATARAX ) 25 MG tablet Take 1 tablet (25 mg total) by mouth 3 (three) times daily as needed for anxiety. 75 tablet 1   promethazine -dextromethorphan (PROMETHAZINE -DM) 6.25-15 MG/5ML syrup Take 5 mLs by mouth 3 (three) times daily as needed for cough. 118 mL 0   No current facility-administered medications for this visit.     Musculoskeletal: Strength & Muscle Tone: within normal limits Gait & Station: normal Patient leans: N/A  Psychiatric Specialty Exam: Review of Systems  Psychiatric/Behavioral:  Positive for dysphoric mood and sleep disturbance. Negative for decreased concentration, hallucinations, self-injury and suicidal ideas. The patient is nervous/anxious. The patient is not hyperactive.     unknown if currently breastfeeding.There is no height or weight on file to calculate BMI.  General Appearance: Well Groomed  Eye Contact:  Good  Speech:  Clear and Coherent and Normal Rate  Volume:  Normal  Mood:  Anxious and  Depressed  Affect:  Appropriate  Thought Process:  Coherent, Goal Directed, and Descriptions of Associations: Intact  Orientation:  Full (Time, Place, and Person)  Thought Content: WDL   Suicidal Thoughts:  No  Homicidal Thoughts:  No  Memory:  Immediate;   Good Recent;   Good Remote;   Good  Judgement:  Good  Insight:  Good  Psychomotor Activity:  Normal  Concentration:  Concentration: Good and Attention Span: Good  Recall:  Good  Fund of Knowledge: Good  Language: Good  Akathisia:  No  Handed:  Right  AIMS (if indicated): not done  Assets:  Communication Skills Desire for Improvement Housing Social Support Transportation Vocational/Educational  ADL's:  Intact  Cognition: WNL  Sleep:  Fair   Screenings: AIMS    Flowsheet Row [facility] Ad[facility] (Discharged[facility] from [facility]  AIMS Total Score 0 0      AUDIT    Flowsheet Row Admission (Discharged) from [facility] Ad[facility] (Dis[facility] Admission (Discharged) from OP [facility]  Alcohol Use Disorder Identification Test Final Score (AUDIT) 1 1 0      GAD-7    Flowsheet Row Video Visit from 02/20/2024 in [facility] Video Visit from 11/21/2023 in [facility] Video Visit from 09/24/2023 in [facility] Video Visit from 08/01/2023 in [facility] Video Visit from 06/20/2023 in [facility]  Total GAD-7 Score 7 5 6 10 13       PHQ2-9    Flowsheet Row Video Visit from 02/20/2024 in [facility] Video Visit from 11/21/2023 in [facility] Video Visit from 09/24/2023 in [facility] Video Visit from 08/01/2023 in [facility] Video Visit from 06/20/2023 in [facility]  PHQ-2 Total Score 2 1 2 3 2   PHQ-9 Total Score 5 -- 10 9 12       Flowsheet Row Video Visit from 02/20/2024 in [facility] Video Visit from 11/21/2023 in [facility] Video Visit from 09/24/2023 in [facility]  C-SSRS RISK CATEGORY Moderate Risk Moderate Risk Moderate Risk        Assessment and Plan:   Nykira Flanery is a 28 year old female with a past psychiatric history significant for generalized anxiety disorder, insomnia, attention and concentration deficit, major depressive disorder, and PTSD who presents to [facility] via virtual video visit for follow-up and medication management.  Patient informed provider that she is currently pregnant.  She does not know how many weeks pregnant she is.  Provider informed patient that hydroxyzine  is contraindicated during early pregnancy.  Patient was instructed to discontinue the medication during her pregnancy.  Patient vocalized understanding.  Provider discussed switching antidepressants during her pregnancy.  Provider informed patient that there is more evidence to suggest that sertraline is more safe to use during pregnancy than any of the other SSRIs.  Patient informed provider that she has been on sertraline in the past but is unsure if the medication was effective.  Patient is interested in being placed on Zoloft during her pregnancy.  Patient continues to endorse some depression and anxiety attributed to external factors in her life.  A PHQ-9 screen was performed with the patient scoring a 5.  A GAD-7 screen was also performed with the patient scoring a 7. Patient to discontinue  her use of Prozac  and to start Zoloft 50 mg daily for the management of her depressive symptoms and anxiety.  Patient denies experiencing any adverse side effects from her use of Strattera .  She reports that the medication  continues to be effective in managing her focus and concentration.  Provider informed patient that she may need to discontinue the medication during her pregnancy.  Patient vocalized understanding.  Provider to discuss with patient about coping strategies during her pregnancy.  Provider to recommend setting patient up with therapy.  Due to patient's insurance being out of network, patient will need to set up psychiatric services with another facility that accepts her insurance.  Provider to discuss with patient options for continuing outpatient psychiatry.  The patient verbalized understanding.  A [place] Suicide Severity Rating Scale with the patient and considered at moderate risk.  Patient denies suicidal ideations and was able to contract for safety following the conclusion of the encounter.  Collaboration of Care: Collaboration of Care: Medication Management AEB provider managing patient's psychiatric medications, Psychiatrist AEB patient being followed by mental health provider at this facility, and Referral or follow-up with counselor/therapist AEB patient being followed by a licensed clinical social worker at this facility  Patient/Guardian was advised Release of Information must be obtained prior to any record release in order to collaborate their care with an outside provider. Patient/Guardian was advised if they have not already done so to contact the registration department to sign all necessary forms in order for us  to release information regarding their care.   Consent: Patient/Guardian gives verbal consent for treatment and assignment of benefits for services provided during this visit. Patient/Guardian expressed understanding and agreed to proceed.   1. Attention and  concentration deficit (Primary) Patient is currently pregnant Provider informed patient to discontinue her use of Strattera  40 mg daily at least during her first trimester  2. Generalized anxiety disorder Patient to discontinue her use of Prozac  in favor of sertraline for the management of her generalized anxiety during her pregnancy  - sertraline (ZOLOFT) 50 MG tablet; Take 1 tablet (50 mg total) by mouth daily.  Dispense: 30 tablet; Refill: 1  3. MDD (major depressive disorder), recurrent severe, without psychosis (HCC) Patient to discontinue her use of Prozac  in favor of sertraline for the management of her major depressive disorder during her pregnancy  - sertraline (ZOLOFT) 50 MG tablet; Take 1 tablet (50 mg total) by mouth daily.  Dispense: 30 tablet; Refill: 1  4. PTSD (post-traumatic stress disorder)  - sertraline (ZOLOFT) 50 MG tablet; Take 1 tablet (50 mg total) by mouth daily.  Dispense: 30 tablet; Refill: 1  Patient unable to continue being seen at this facility due to her insurance being out of network Provider to discuss with patient other outpatient psychiatry facilities that may accept her insurance Provider spent a total of 24 minutes with the patient/reviewing patient's chart  Gates Kasal, PA 02/20/2024, 6:07 PM

## 2024-02-21 ENCOUNTER — Inpatient Hospital Stay (HOSPITAL_COMMUNITY)
Admission: AD | Admit: 2024-02-21 | Discharge: 2024-02-21 | Discharge status: Against Medical Advice | Attending: Obstetrics and Gynecology | Admitting: Obstetrics and Gynecology

## 2024-02-22 DIAGNOSIS — N926 Irregular menstruation, unspecified: Secondary | ICD-10-CM | POA: Diagnosis not present

## 2024-02-23 MED ORDER — SERTRALINE HCL 50 MG PO TABS: 50.0000 mg | ORAL_TABLET | Freq: Every day | ORAL | 1 refills | Status: DC

## 2024-02-25 DIAGNOSIS — F332 Major depressive disorder, recurrent severe without psychotic features: Secondary | ICD-10-CM | POA: Diagnosis not present

## 2024-03-03 DIAGNOSIS — F332 Major depressive disorder, recurrent severe without psychotic features: Secondary | ICD-10-CM | POA: Diagnosis not present

## 2024-03-31 DIAGNOSIS — Z124 Encounter for screening for malignant neoplasm of cervix: Secondary | ICD-10-CM | POA: Diagnosis not present

## 2024-03-31 DIAGNOSIS — Z113 Encounter for screening for infections with a predominantly sexual mode of transmission: Secondary | ICD-10-CM | POA: Diagnosis not present

## 2024-03-31 DIAGNOSIS — Z01411 Encounter for gynecological examination (general) (routine) with abnormal findings: Secondary | ICD-10-CM | POA: Diagnosis not present

## 2024-03-31 DIAGNOSIS — N939 Abnormal uterine and vaginal bleeding, unspecified: Secondary | ICD-10-CM | POA: Diagnosis not present

## 2024-03-31 DIAGNOSIS — F332 Major depressive disorder, recurrent severe without psychotic features: Secondary | ICD-10-CM | POA: Diagnosis not present

## 2024-04-14 DIAGNOSIS — F332 Major depressive disorder, recurrent severe without psychotic features: Secondary | ICD-10-CM | POA: Diagnosis not present

## 2024-04-27 DIAGNOSIS — F332 Major depressive disorder, recurrent severe without psychotic features: Secondary | ICD-10-CM | POA: Diagnosis not present

## 2024-06-16 DIAGNOSIS — N939 Abnormal uterine and vaginal bleeding, unspecified: Secondary | ICD-10-CM | POA: Diagnosis not present

## 2024-06-16 DIAGNOSIS — D251 Intramural leiomyoma of uterus: Secondary | ICD-10-CM | POA: Diagnosis not present

## 2024-06-16 DIAGNOSIS — Z8759 Personal history of other complications of pregnancy, childbirth and the puerperium: Secondary | ICD-10-CM | POA: Diagnosis not present

## 2024-07-23 DIAGNOSIS — N939 Abnormal uterine and vaginal bleeding, unspecified: Secondary | ICD-10-CM | POA: Diagnosis not present

## 2024-07-23 DIAGNOSIS — D219 Benign neoplasm of connective and other soft tissue, unspecified: Secondary | ICD-10-CM | POA: Diagnosis not present

## 2024-08-06 ENCOUNTER — Encounter: Payer: Self-pay | Admitting: *Deleted

## 2024-08-06 ENCOUNTER — Other Ambulatory Visit: Payer: Self-pay

## 2024-08-06 ENCOUNTER — Ambulatory Visit
Admission: EM | Admit: 2024-08-06 | Discharge: 2024-08-06 | Disposition: A | Discharge status: Home / Self Care | Attending: Family Medicine | Admitting: Family Medicine

## 2024-08-06 DIAGNOSIS — Z0189 Encounter for other specified special examinations: Secondary | ICD-10-CM | POA: Diagnosis not present

## 2024-08-06 DIAGNOSIS — J4521 Mild intermittent asthma with (acute) exacerbation: Secondary | ICD-10-CM

## 2024-08-06 DIAGNOSIS — J00 Acute nasopharyngitis [common cold]: Secondary | ICD-10-CM

## 2024-08-06 DIAGNOSIS — N910 Primary amenorrhea: Secondary | ICD-10-CM | POA: Diagnosis not present

## 2024-08-06 LAB — POCT URINE PREGNANCY: Preg Test, Ur: NEGATIVE

## 2024-08-06 MED ORDER — PROMETHAZINE-DM 6.25-15 MG/5ML PO SYRP: 5.0000 mL | ORAL_SOLUTION | Freq: Four times a day (QID) | ORAL | 0 refills | Status: AC | PRN

## 2024-08-06 MED ORDER — ALBUTEROL SULFATE HFA 108 (90 BASE) MCG/ACT IN AERS: 1.0000 | INHALATION_SPRAY | Freq: Four times a day (QID) | RESPIRATORY_TRACT | 1 refills | Status: AC | PRN

## 2024-08-06 MED ORDER — PREDNISONE 20 MG PO TABS: 40.0000 mg | ORAL_TABLET | Freq: Every day | ORAL | 0 refills | Status: DC
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
## 2024-08-06 NOTE — ED Triage Notes (Addendum)
 Pt reports feeling unwell since yesterday: cough, subjective fever, feel weak, body aches. She has tried nyquil with minimal relief. Denies known sick contacts. Also requesting pregnancy test. States she did not have her period in September.

## 2024-08-10 DIAGNOSIS — F332 Major depressive disorder, recurrent severe without psychotic features: Secondary | ICD-10-CM | POA: Diagnosis not present

## 2024-08-13 DIAGNOSIS — D259 Leiomyoma of uterus, unspecified: Secondary | ICD-10-CM | POA: Diagnosis not present

## 2024-08-13 DIAGNOSIS — D251 Intramural leiomyoma of uterus: Secondary | ICD-10-CM | POA: Diagnosis not present

## 2024-08-13 DIAGNOSIS — N939 Abnormal uterine and vaginal bleeding, unspecified: Secondary | ICD-10-CM | POA: Diagnosis not present

## 2024-08-13 DIAGNOSIS — Z01818 Encounter for other preprocedural examination: Secondary | ICD-10-CM | POA: Diagnosis not present

## 2024-08-20 DIAGNOSIS — D500 Iron deficiency anemia secondary to blood loss (chronic): Secondary | ICD-10-CM | POA: Insufficient documentation

## 2024-08-20 DIAGNOSIS — N939 Abnormal uterine and vaginal bleeding, unspecified: Secondary | ICD-10-CM | POA: Diagnosis not present

## 2024-08-28 DIAGNOSIS — F902 Attention-deficit hyperactivity disorder, combined type: Secondary | ICD-10-CM | POA: Diagnosis not present

## 2024-08-28 DIAGNOSIS — F331 Major depressive disorder, recurrent, moderate: Secondary | ICD-10-CM | POA: Diagnosis not present

## 2024-08-28 DIAGNOSIS — F411 Generalized anxiety disorder: Secondary | ICD-10-CM | POA: Diagnosis not present

## 2024-08-31 DIAGNOSIS — F332 Major depressive disorder, recurrent severe without psychotic features: Secondary | ICD-10-CM | POA: Diagnosis not present

## 2024-09-01 DIAGNOSIS — D500 Iron deficiency anemia secondary to blood loss (chronic): Secondary | ICD-10-CM | POA: Diagnosis not present

## 2024-09-07 DIAGNOSIS — D500 Iron deficiency anemia secondary to blood loss (chronic): Secondary | ICD-10-CM | POA: Diagnosis not present

## 2024-09-08 DIAGNOSIS — N939 Abnormal uterine and vaginal bleeding, unspecified: Secondary | ICD-10-CM | POA: Diagnosis not present

## 2024-09-08 DIAGNOSIS — J45909 Unspecified asthma, uncomplicated: Secondary | ICD-10-CM | POA: Diagnosis not present

## 2024-09-08 DIAGNOSIS — D649 Anemia, unspecified: Secondary | ICD-10-CM | POA: Diagnosis not present

## 2024-09-08 DIAGNOSIS — Z48816 Encounter for surgical aftercare following surgery on the genitourinary system: Secondary | ICD-10-CM | POA: Diagnosis not present

## 2024-09-08 DIAGNOSIS — N736 Female pelvic peritoneal adhesions (postinfective): Secondary | ICD-10-CM | POA: Diagnosis not present

## 2024-09-08 DIAGNOSIS — D251 Intramural leiomyoma of uterus: Secondary | ICD-10-CM | POA: Diagnosis not present

## 2024-09-08 DIAGNOSIS — Z6841 Body Mass Index (BMI) 40.0 and over, adult: Secondary | ICD-10-CM | POA: Diagnosis not present

## 2024-09-08 DIAGNOSIS — I10 Essential (primary) hypertension: Secondary | ICD-10-CM | POA: Diagnosis not present

## 2024-09-08 DIAGNOSIS — N946 Dysmenorrhea, unspecified: Secondary | ICD-10-CM | POA: Diagnosis not present

## 2024-09-08 DIAGNOSIS — E66813 Obesity, class 3: Secondary | ICD-10-CM | POA: Diagnosis not present

## 2024-09-08 DIAGNOSIS — D259 Leiomyoma of uterus, unspecified: Secondary | ICD-10-CM | POA: Diagnosis not present

## 2024-09-08 DIAGNOSIS — Z9889 Other specified postprocedural states: Secondary | ICD-10-CM | POA: Insufficient documentation

## 2024-09-16 ENCOUNTER — Other Ambulatory Visit: Payer: Self-pay

## 2024-09-16 ENCOUNTER — Emergency Department (HOSPITAL_COMMUNITY)

## 2024-09-16 ENCOUNTER — Emergency Department (HOSPITAL_COMMUNITY)
Admission: EM | Admit: 2024-09-16 | Discharge: 2024-09-17 | Disposition: A | Discharge status: Home / Self Care | Attending: Emergency Medicine | Admitting: Emergency Medicine

## 2024-09-16 DIAGNOSIS — R109 Unspecified abdominal pain: Secondary | ICD-10-CM | POA: Insufficient documentation

## 2024-09-16 DIAGNOSIS — R509 Fever, unspecified: Secondary | ICD-10-CM | POA: Insufficient documentation

## 2024-09-16 DIAGNOSIS — Z9889 Other specified postprocedural states: Secondary | ICD-10-CM | POA: Diagnosis not present

## 2024-09-16 DIAGNOSIS — J45909 Unspecified asthma, uncomplicated: Secondary | ICD-10-CM | POA: Diagnosis not present

## 2024-09-16 LAB — RESP PANEL BY RT-PCR (RSV, FLU A&B, COVID)  RVPGX2
Influenza A by PCR: NEGATIVE
Influenza B by PCR: NEGATIVE
Resp Syncytial Virus by PCR: NEGATIVE
SARS Coronavirus 2 by RT PCR: NEGATIVE

## 2024-09-16 LAB — CBC WITH DIFFERENTIAL/PLATELET
Abs Immature Granulocytes: 0.01 K/uL (ref 0.00–0.07)
Basophils Absolute: 0 K/uL (ref 0.0–0.1)
Basophils Relative: 0 %
Eosinophils Absolute: 0.1 K/uL (ref 0.0–0.5)
Eosinophils Relative: 1 %
HCT: 30.4 % — ABNORMAL LOW (ref 36.0–46.0)
Hemoglobin: 9.6 g/dL — ABNORMAL LOW (ref 12.0–15.0)
Immature Granulocytes: 0 %
Lymphocytes Relative: 28 %
Lymphs Abs: 2.2 K/uL (ref 0.7–4.0)
MCH: 28 pg (ref 26.0–34.0)
MCHC: 31.6 g/dL (ref 30.0–36.0)
MCV: 88.6 fL (ref 80.0–100.0)
Monocytes Absolute: 0.6 K/uL (ref 0.1–1.0)
Monocytes Relative: 8 %
Neutro Abs: 4.8 K/uL (ref 1.7–7.7)
Neutrophils Relative %: 63 %
Platelets: 228 K/uL (ref 150–400)
RBC: 3.43 MIL/uL — ABNORMAL LOW (ref 3.87–5.11)
RDW: 14.7 % (ref 11.5–15.5)
WBC: 7.7 K/uL (ref 4.0–10.5)
nRBC: 0 % (ref 0.0–0.2)

## 2024-09-16 LAB — COMPREHENSIVE METABOLIC PANEL WITH GFR
ALT: 22 U/L (ref 0–44)
AST: 17 U/L (ref 15–41)
Albumin: 3.8 g/dL (ref 3.5–5.0)
Alkaline Phosphatase: 67 U/L (ref 38–126)
Anion gap: 10 (ref 5–15)
BUN: 11 mg/dL (ref 6–20)
CO2: 26 mmol/L (ref 22–32)
Calcium: 9 mg/dL (ref 8.9–10.3)
Chloride: 104 mmol/L (ref 98–111)
Creatinine, Ser: 0.91 mg/dL (ref 0.44–1.00)
GFR, Estimated: >60 mL/min (ref >60)
Glucose, Bld: 103 mg/dL — ABNORMAL HIGH (ref 70–99)
Potassium: 3.7 mmol/L (ref 3.5–5.1)
Sodium: 140 mmol/L (ref 135–145)
Total Bilirubin: 0.4 mg/dL (ref 0.0–1.2)
Total Protein: 7.1 g/dL (ref 6.5–8.1)

## 2024-09-16 LAB — HCG, SERUM, QUALITATIVE: Preg, Serum: NEGATIVE

## 2024-09-16 LAB — URINALYSIS, W/ REFLEX TO CULTURE (INFECTION SUSPECTED)
Bacteria, UA: NONE SEEN
Bilirubin Urine: NEGATIVE
Glucose, UA: NEGATIVE mg/dL
Hgb urine dipstick: NEGATIVE
Ketones, ur: NEGATIVE mg/dL
Leukocytes,Ua: NEGATIVE
Nitrite: NEGATIVE
Protein, ur: NEGATIVE mg/dL
Specific Gravity, Urine: 1.027 (ref 1.005–1.030)
pH: 6 (ref 5.0–8.0)

## 2024-09-16 LAB — I-STAT CG4 LACTIC ACID, ED: Lactic Acid, Venous: 1.3 mmol/L (ref 0.5–1.9)

## 2024-09-16 NOTE — ED Triage Notes (Signed)
 Pt reports she developed a low grade fever last night and then today it was 102. She called her surgeon who told her to go to the ER. She had a uterine fibroid removed last week. Denies any pain other than usual soreness from the surgery. She has not taken any antipyretics.

## 2024-09-16 NOTE — ED Provider Triage Note (Signed)
 Emergency Medicine Provider Triage Evaluation Note  Enzo DELENA Ply , a 28 y.o. female  was evaluated in triage.  Pt complains of intermittent 6/10 abd pain, chills, nausea,fever. 102 T max today S/p myomectomy on 09/08/24 No cough, congestion, NVD, urinary symptoms  Review of Systems  Positive: See hpi Negative:   Physical Exam  BP 132/65 (BP Location: Left Arm)   Pulse 96   Temp 99.4 F (37.4 C) (Oral)   Resp 18   Ht 5' 7 (1.702 m)   Wt (!) 137.4 kg   LMP 09/02/2024   SpO2 99%   BMI 47.46 kg/m  Gen:   Awake, no distress   Resp:  Normal effort  MSK:   Moves extremities without difficulty  Other:    Medical Decision Making  Medically screening exam initiated at 10:14 PM.  Appropriate orders placed.  ASHTON SABINE was informed that the remainder of the evaluation will be completed by another provider, this initial triage assessment does not replace that evaluation, and the importance of remaining in the ED until their evaluation is complete.  Labs, CT ordered   Minnie Tinnie BRAVO, [place] 09/16/24 2217

## 2024-09-16 NOTE — ED Provider Notes (Incomplete)
 [place] EMERGENCY DEPARTMENT AT [facility] Provider Note   CSN: [id number] Arrival date & time: 09/16/24  1847     Patient presents with: Fever   Jamie Stevens is a 28 y.o. female.  Patient with past medical history seen for fibroid surgery November 11, depression, anxiety, panic anxiety syndrome, asthma presents emergency department complaining of low-grade fever yesterday.  She also had some abdominal tenderness that developed today.  She stopped taking oxycodone postoperatively yesterday.  She was seen by her surgery team today who felt that her exam was reassuring.  She denies nausea, vomiting, chest pain.  She does endorse a mild cough for the past 2 days with low-grade fevers at home.  At 1 point she noticed a fever of 102 Fahrenheit which resolved without medication.  She denies urinary symptoms, chest pain, shortness of breath.    Fever      Prior to Admission medications   Medication Sig Start Date End Date Taking? Authorizing Provider  albuterol  (VENTOLIN  HFA) 108 (90 Base) MCG/ACT inhaler Inhale 1-2 puffs into the lungs every 6 (six) hours as needed for wheezing or shortness of breath. 08/06/24   Rolinda Rogue, MD  hydrOXYzine  (ATARAX ) 25 MG tablet Take 1 tablet (25 mg total) by mouth 3 (three) times daily as needed for anxiety. 11/21/23   Nwoko, Uchenna E, PA  predniSONE  (DELTASONE ) 20 MG tablet Take 2 tablets (40 mg total) by mouth daily. 08/06/24   Rolinda Rogue, MD  promethazine -dextromethorphan (PROMETHAZINE -DM) 6.25-15 MG/5ML syrup Take 5 mLs by mouth 4 (four) times daily as needed for cough. 08/06/24   Rolinda Rogue, MD    Allergies: Patient has no known allergies.    Review of Systems  Constitutional:  Positive for fever.    Updated Vital Signs BP 139/78   Pulse 84   Temp 98.6 F (37 C) (Oral)   Resp 16   Ht 5' 7 (1.702 m)   Wt (!) 137.4 kg   LMP 09/02/2024   SpO2 99%   BMI 47.46 kg/m   Physical Exam Vitals and nursing note reviewed.   Constitutional:      General: She is not in acute distress.    Appearance: She is well-developed.  HENT:     Head: Normocephalic and atraumatic.  Eyes:     Conjunctiva/sclera: Conjunctivae normal.  Cardiovascular:     Rate and Rhythm: Normal rate and regular rhythm.     Heart sounds: No murmur heard. Pulmonary:     Effort: Pulmonary effort is normal. No respiratory distress.     Breath sounds: Normal breath sounds.  Abdominal:     Palpations: Abdomen is soft.     Tenderness: There is no abdominal tenderness.  Musculoskeletal:        General: No swelling.     Cervical back: Neck supple.  Skin:    General: Skin is warm and dry.     Capillary Refill: Capillary refill takes less than 2 seconds.     Comments: Well healing abdominal incision with no overlying erythema, drainage.   Neurological:     Mental Status: She is alert.  Psychiatric:        Mood and Affect: Mood normal.     (all labs ordered are listed, but only abnormal results are displayed) Labs Reviewed  COMPREHENSIVE METABOLIC PANEL WITH GFR - Abnormal; Notable for the following components:      Result Value   Glucose, Bld 103 (*)    All other components within normal  limits  CBC WITH DIFFERENTIAL/PLATELET - Abnormal; Notable for the following components:   RBC 3.43 (*)    Hemoglobin 9.6 (*)    HCT 30.4 (*)    All other components within normal limits  RESP PANEL BY RT-PCR (RSV, FLU A&B, COVID)  RVPGX2  URINALYSIS, W/ REFLEX TO CULTURE (INFECTION SUSPECTED)  HCG, SERUM, QUALITATIVE  I-STAT CG4 LACTIC ACID, ED    EKG: None  Radiology: DG Chest 2 View Result Date: 09/16/2024 CLINICAL DATA:  Low-grade fever. EXAM: CHEST - 2 VIEW COMPARISON:  January 18, 2024 FINDINGS: The heart size and mediastinal contours are within normal limits. Both lungs are clear. The visualized skeletal structures are unremarkable. IMPRESSION: No active cardiopulmonary disease. Electronically Signed   By: Suzen Dials M.D.   On:  09/16/2024 20:30     Procedures   Medications Ordered in the ED - No data to display                                  Medical Decision Making Amount and/or Complexity of Data Reviewed Labs: ordered. Radiology: ordered.   This patient presents to the ED for concern of fever, this involves an extensive number of treatment options, and is a complaint that carries with it a high risk of complications and morbidity.  The differential diagnosis includes respiratory infection, atelectasis, urinary tract infection, others   Co morbidities / Chronic conditions that complicate the patient evaluation  Recent surgery   Additional history obtained:  Additional history obtained from EMR External records from outside source obtained and reviewed including GYN notes   Lab Tests:  I Ordered, and personally interpreted labs.  The pertinent results include: Unremarkable CBC, CMP, UA   Imaging Studies ordered:  I ordered imaging studies including chest x-ray I independently visualized and interpreted imaging which showed no acute findings I agree with the radiologist interpretation   Test / Admission - Considered:  Patient with reassuring workup.  Chest x-ray with no acute findings.  No leukocytosis, abdominal incision appears to be well-healing.  She has no abdominal tenderness on examination.  Lungs are clear to auscultation.  Urine showed no signs of infection.  Suspect patient may have underlying respiratory infection.  Have recommended supportive care at home.  No indication for further emergent workup or admission at this time.      Final diagnoses:  Fever, unspecified fever cause    ED Discharge Orders     None          Logan Ubaldo KATHEE DEVONNA 09/17/24 0022    Logan Ubaldo KATHEE, PA-C 09/17/24 [id number]    Midge Golas, MD 09/17/24 [phone]

## 2024-09-17 ENCOUNTER — Encounter (HOSPITAL_COMMUNITY): Payer: Self-pay

## 2024-09-17 NOTE — Discharge Instructions (Signed)
 Your workup this evening was reassuring.  Your fever may be due to an underlying respiratory infection.  I recommend taking Tylenol  and/or ibuprofen  for fever/pain control at home.  Your fever subsided this evening without any medication.  Follow-up with your surgical team for further evaluation of your surgical site as needed.  Return to the emergency department if you develop any life-threatening symptoms.

## 2024-09-17 NOTE — ED Notes (Signed)
 Pt axox4. GCS 15. PT verbalizes understanding of discharge instructions and follow up. Pt ambulated out of er with steady gait to transportation home

## 2024-09-29 DIAGNOSIS — Z09 Encounter for follow-up examination after completed treatment for conditions other than malignant neoplasm: Secondary | ICD-10-CM | POA: Diagnosis not present

## 2024-09-30 DIAGNOSIS — D500 Iron deficiency anemia secondary to blood loss (chronic): Secondary | ICD-10-CM | POA: Diagnosis not present

## 2024-10-06 DIAGNOSIS — F332 Major depressive disorder, recurrent severe without psychotic features: Secondary | ICD-10-CM | POA: Diagnosis not present

## 2024-10-07 DIAGNOSIS — F902 Attention-deficit hyperactivity disorder, combined type: Secondary | ICD-10-CM | POA: Diagnosis not present

## 2024-10-07 DIAGNOSIS — F331 Major depressive disorder, recurrent, moderate: Secondary | ICD-10-CM | POA: Diagnosis not present

## 2024-10-07 DIAGNOSIS — D500 Iron deficiency anemia secondary to blood loss (chronic): Secondary | ICD-10-CM | POA: Diagnosis not present

## 2024-10-07 DIAGNOSIS — F411 Generalized anxiety disorder: Secondary | ICD-10-CM | POA: Diagnosis not present

## 2024-10-19 DIAGNOSIS — F332 Major depressive disorder, recurrent severe without psychotic features: Secondary | ICD-10-CM | POA: Diagnosis not present

## 2024-10-19 DIAGNOSIS — F900 Attention-deficit hyperactivity disorder, predominantly inattentive type: Secondary | ICD-10-CM | POA: Diagnosis not present

## 2024-12-03 ENCOUNTER — Ambulatory Visit
Admission: EM | Admit: 2024-12-03 | Discharge: 2024-12-03 | Disposition: A | Discharge status: Home / Self Care | Source: Home / Self Care

## 2024-12-03 ENCOUNTER — Encounter: Payer: Self-pay | Admitting: Emergency Medicine

## 2024-12-03 DIAGNOSIS — J069 Acute upper respiratory infection, unspecified: Secondary | ICD-10-CM

## 2024-12-03 DIAGNOSIS — R059 Cough, unspecified: Secondary | ICD-10-CM | POA: Diagnosis not present

## 2024-12-03 LAB — POC COVID19/FLU A&B COMBO
Covid Antigen, POC: NEGATIVE
Influenza A Antigen, POC: NEGATIVE
Influenza B Antigen, POC: NEGATIVE

## 2024-12-03 MED ORDER — ALBUTEROL SULFATE HFA 108 (90 BASE) MCG/ACT IN AERS
2.0000 | INHALATION_SPRAY | RESPIRATORY_TRACT | Status: DC
  Administered 2024-12-03: 2 via RESPIRATORY_TRACT

## 2024-12-03 NOTE — ED Triage Notes (Signed)
 Patient presents for cough, fever, vomiting, and upset stomach x 1 day.  Patient has been taken theraflu.

## 2024-12-03 NOTE — Discharge Instructions (Addendum)
 Use albuterol  every 4 hours.  Return if any problems.

## 2024-12-04 ENCOUNTER — Ambulatory Visit (INDEPENDENT_AMBULATORY_CARE_PROVIDER_SITE_OTHER)

## 2024-12-04 ENCOUNTER — Encounter: Payer: Self-pay | Admitting: Emergency Medicine

## 2024-12-04 ENCOUNTER — Ambulatory Visit
Admission: EM | Admit: 2024-12-04 | Discharge: 2024-12-04 | Disposition: A | Discharge status: Home / Self Care | Source: Home / Self Care

## 2024-12-04 DIAGNOSIS — J189 Pneumonia, unspecified organism: Secondary | ICD-10-CM

## 2024-12-04 DIAGNOSIS — R0602 Shortness of breath: Secondary | ICD-10-CM

## 2024-12-04 DIAGNOSIS — N912 Amenorrhea, unspecified: Secondary | ICD-10-CM

## 2024-12-04 LAB — POCT URINE PREGNANCY: Preg Test, Ur: NEGATIVE

## 2024-12-04 MED ORDER — AMOXICILLIN-POT CLAVULANATE 875-125 MG PO TABS: 1.0000 | ORAL_TABLET | Freq: Two times a day (BID) | ORAL | 0 refills | Status: AC

## 2024-12-04 MED ORDER — AZITHROMYCIN 250 MG PO TABS: 250.0000 mg | ORAL_TABLET | Freq: Every day | ORAL | 0 refills | Status: AC

## 2024-12-04 MED ORDER — GUAIFENESIN ER 600 MG PO TB12: 600.0000 mg | ORAL_TABLET | Freq: Two times a day (BID) | ORAL | 0 refills | Status: AC

## 2024-12-04 MED ORDER — BENZONATATE 100 MG PO CAPS: 100.0000 mg | ORAL_CAPSULE | Freq: Three times a day (TID) | ORAL | 0 refills | Status: AC

## 2024-12-04 MED ORDER — METHYLPREDNISOLONE 4 MG PO TBPK: ORAL_TABLET | ORAL | 0 refills | Status: AC

## 2024-12-04 NOTE — ED Triage Notes (Signed)
 Pt presents for intermittent episodes of SOB that started yesterday. Seen yesterday in clinic for cold symptoms with negative testing - prescribed albuterol  inhaler that she has taken every 4 hrs with little improvement. Easily gets short of breath with minimal activity.

## 2024-12-04 NOTE — ED Provider Notes (Signed)
 " [facility]    CSN: [id number] Arrival date & time: 12/04/24  1548      History   Chief Complaint Chief Complaint  Patient presents with   Shortness of Breath    HPI [name] is a 29 y.o. female.   Pt presents returns today after being seen for shortness of breath. Pt states that she used her inhaler as instructed with no significant relief. Pt states that she has a cough productive of yellow-greenish sputum and loss of appetite. Pt states that she feels feverish but denies having a fever.   The history is provided by the patient.  Shortness of Breath   Past Medical History:  Diagnosis Date   Asthma    Depression    GERD (gastroesophageal reflux disease)    Obesity    Panic anxiety syndrome    Seasonal allergies    Vision abnormalities    Pt wears glasses    Patient Active Problem List   Diagnosis Date Noted   Post-operative state 09/08/2024   Iron deficiency anemia due to chronic blood loss 08/20/2024   Insomnia due to other mental disorder 01/17/2023   Attention and concentration deficit 01/17/2023   Unresolved grief 01/15/2023   PTSD (post-traumatic stress disorder) 10/20/2022   Generalized anxiety disorder 10/20/2022   MDD (major depressive disorder), recurrent episode, severe (HCC) 10/19/2022   ASCUS with positive high risk HPV cervical 09/17/2022   Cannabis abuse 03/09/2022   Allergic rhinitis 10/24/2021   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   History of suicide attempt 04/25/2021   MDD (major depressive disorder), recurrent severe, without psychosis (HCC)    Suicidal ideation    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   Binge eating disorder 02/20/2021   Anxiety associated with depression 08/23/2015   Other chronic allergic conjunctivitis 08/06/2013   Asthma 07/13/2013   Gastroesophageal reflux 06/11/2013   Obesity 06/11/2013   Anemia 04/07/2012   Acquired acanthosis nigricans 04/07/2012    Past Surgical  History:  Procedure Laterality Date   ADENOIDECTOMY     TONSILLECTOMY      OB History     Gravida  1   Para      Term      Preterm      AB  1   Living         SAB  1   IAB      Ectopic      Multiple      Live Births               Home Medications    Prior to Admission medications  Medication Sig Start Date End Date Taking? Authorizing Provider  albuterol  (VENTOLIN  HFA) 108 (90 Base) MCG/ACT inhaler Inhale 1-2 puffs into the lungs every 6 (six) hours as needed for wheezing or shortness of breath. 08/06/24  Yes Rolinda Rogue, MD  amoxicillin -clavulanate (AUGMENTIN ) 875-125 MG tablet Take 1 tablet by mouth every 12 (twelve) hours. 12/04/24  Yes Jamie Corean BROCKS, PA-C  azithromycin  (ZITHROMAX ) 250 MG tablet Take 1 tablet (250 mg total) by mouth daily. Take first 2 tablets together, then 1 every day until finished. 12/04/24  Yes Jamie Corean BROCKS, PA-C  benzonatate  (TESSALON ) 100 MG capsule Take 1 capsule (100 mg total) by mouth every 8 (eight) hours. 12/04/24  Yes Jamie Corean BROCKS, PA-C  guaiFENesin  (MUCINEX ) 600 MG 12 hr tablet Take 1 tablet (600 mg total) by mouth 2 (two) times daily for 10 days. 12/04/24  12/14/24 Yes Jamie Corean BROCKS, PA-C  ibuprofen  (ADVIL ) 600 MG tablet Take 600 mg by mouth every 6 (six) hours. 09/10/24  Yes [provider]  methylPREDNISolone  (MEDROL  DOSEPAK) 4 MG TBPK tablet Take as directed on back of package 12/04/24  Yes Jamie Corean BROCKS, PA-C  hydrOXYzine  (ATARAX ) 25 MG tablet Take 1 tablet (25 mg total) by mouth 3 (three) times daily as needed for anxiety. 11/21/23   Nwoko, Uchenna E, PA  oxyCODONE (OXY IR/ROXICODONE) 5 MG immediate release tablet Take 5 mg by mouth every 6 (six) hours as needed. Patient not taking: Reported on 12/04/2024 09/10/24   [provider]  promethazine -dextromethorphan (PROMETHAZINE -DM) 6.25-15 MG/5ML syrup Take 5 mLs by mouth 4 (four) times daily as needed for cough. Patient not  taking: Reported on 12/04/2024 08/06/24   Rolinda Rogue, MD    Family History Family History  Problem Relation Age of Onset   Depression Mother    Vision loss Mother    Hyperlipidemia Mother    Diabetes Mother    Cancer Mother    Alcohol abuse Father    Depression Father    Heart disease Father    Cancer Father     Social History Social History[1]   Allergies   Acer negundo allergy skin test, Dust mite extract, Grass pollen([id number]-swt vern), and Pollen extract   Review of Systems Review of Systems  Respiratory:  Positive for shortness of breath.      Physical Exam Triage Vital Signs ED Triage Vitals [12/04/24 1558]  Encounter Vitals Group     BP 132/88     Girls Systolic BP Percentile      Girls Diastolic BP Percentile      Boys Systolic BP Percentile      Boys Diastolic BP Percentile      Pulse Rate 98     Resp 20     Temp 98.5 F (36.9 C)     Temp Source Oral     SpO2 94 %     Weight      Height      Head Circumference      Peak Flow      Pain Score 7     Pain Loc      Pain Education      Exclude from Growth Chart    No data found.  Updated Vital Signs BP 132/88 (BP Location: Left Arm)   Pulse 98   Temp 98.5 F (36.9 C) (Oral)   Resp 20   LMP 11/12/2024 (Approximate)   SpO2 94%   Visual Acuity Right Eye Distance:   Left Eye Distance:   Bilateral Distance:    Right Eye Near:   Left Eye Near:    Bilateral Near:     Physical Exam Vitals and nursing note reviewed.  Constitutional:      General: She is not in acute distress.    Appearance: Normal appearance. She is not ill-appearing, toxic-appearing or diaphoretic.  Eyes:     General: No scleral icterus. Cardiovascular:     Rate and Rhythm: Normal rate and regular rhythm.     Heart sounds: Normal heart sounds.  Pulmonary:     Effort: Pulmonary effort is normal. No respiratory distress.     Breath sounds: Examination of the right-upper field reveals decreased breath sounds. Examination  of the left-upper field reveals decreased breath sounds. Examination of the right-lower field reveals decreased breath sounds. Examination of the left-lower field reveals decreased breath sounds. Decreased breath sounds  present. No wheezing or rhonchi.  Skin:    General: Skin is warm.  Neurological:     Mental Status: She is alert and oriented to person, place, and time.  Psychiatric:        Mood and Affect: Mood normal.        Behavior: Behavior normal.      UC Treatments / Results  Labs (all labs ordered are listed, but only abnormal results are displayed) Labs Reviewed  POCT URINE PREGNANCY - Normal    EKG   Radiology DG Chest 2 View Result Date: 12/04/2024 EXAM: 2 VIEW(S) XRAY OF THE CHEST 12/04/2024 04:36:19 PM COMPARISON: 09/16/2024 CLINICAL HISTORY: shortness of breath FINDINGS: LUNGS AND PLEURA: Mild right basilar opacity, favoring atelectasis, although pneumonia remains possible. No pleural effusion. No pneumothorax. HEART AND MEDIASTINUM: No acute abnormality of the cardiac and mediastinal silhouettes. BONES AND SOFT TISSUES: No acute osseous abnormality. IMPRESSION: 1. Mild right basilar opacity, favoring atelectasis, although pneumonia remains possible. Electronically signed by: Pinkie Pebbles MD 12/04/2024 04:46 PM EST RP Workstation: [id number]    Procedures Procedures (including critical care time)  Medications Ordered in UC Medications - No data to display  Initial Impression / Assessment and Plan / UC Course  I have reviewed the triage vital signs and the nursing notes.  Pertinent labs & imaging results that were available during my care of the patient were reviewed by me and considered in my medical decision making (see chart for details).     Final Clinical Impressions(s) / UC Diagnoses   Final diagnoses:  SOB (shortness of breath)  Amenorrhea  Pneumonia due to infectious organism, unspecified laterality, unspecified part of lung     Discharge  Instructions      You have been diagnosed with pneumonia today. You have been treated with 2 antibiotics, tessalon , mucinex , and medrol  dosepack.   You should get another xray in 4-6 weeks with your PCP to ensure that the pneumonia has resolved.     ED Prescriptions     Medication Sig Dispense Auth. Provider   amoxicillin -clavulanate (AUGMENTIN ) 875-125 MG tablet Take 1 tablet by mouth every 12 (twelve) hours. 14 tablet Jamie Krabbe C, PA-C   azithromycin  (ZITHROMAX ) 250 MG tablet Take 1 tablet (250 mg total) by mouth daily. Take first 2 tablets together, then 1 every day until finished. 6 tablet Jamie Krabbe BROCKS, PA-C   benzonatate  (TESSALON ) 100 MG capsule Take 1 capsule (100 mg total) by mouth every 8 (eight) hours. 30 capsule Jamie Krabbe C, PA-C   guaiFENesin  (MUCINEX ) 600 MG 12 hr tablet Take 1 tablet (600 mg total) by mouth 2 (two) times daily for 10 days. 20 tablet Jamie Krabbe C, PA-C   methylPREDNISolone  (MEDROL  DOSEPAK) 4 MG TBPK tablet Take as directed on back of package 21 each Jamie Krabbe BROCKS, PA-C      PDMP not reviewed this encounter.     [1]  Social History Tobacco Use   Smoking status: Never    Passive exposure: Yes   Smokeless tobacco: Never  Vaping Use   Vaping status: Never Used  Substance Use Topics   Alcohol use: No   Drug use: Yes    Types: Marijuana     Jamie Krabbe BROCKS, PA-C 12/04/24 1748  "

## 2024-12-04 NOTE — Discharge Instructions (Signed)
 You have been diagnosed with pneumonia today. You have been treated with 2 antibiotics, tessalon , mucinex , and medrol  dosepack.   You should get another xray in 4-6 weeks with your PCP to ensure that the pneumonia has resolved.

## 2024-12-04 NOTE — ED Provider Notes (Signed)
 " [facility]    CSN: [id number] Arrival date & time: 12/03/24  1632      History   Chief Complaint Chief Complaint  Patient presents with   Cough   Emesis   Fever    HPI Jamie Stevens is a 29 y.o. female.   Patient complains of a cough and congestion.  Patient reports nausea and vomiting.  Patient reports she vomited once today.  Patient reports taking Tamiflu for symptoms.  Patient denies any current abdominal pain.  Patient complains of nasal congestion   Cough Associated symptoms: fever   Emesis Associated symptoms: cough and fever   Fever Associated symptoms: cough and vomiting     Past Medical History:  Diagnosis Date   Asthma    Depression    GERD (gastroesophageal reflux disease)    Obesity    Panic anxiety syndrome    Seasonal allergies    Vision abnormalities    Pt wears glasses    Patient Active Problem List   Diagnosis Date Noted   Insomnia due to other mental disorder 01/17/2023   Attention and concentration deficit 01/17/2023   Unresolved grief 01/15/2023   PTSD (post-traumatic stress disorder) 10/20/2022   Generalized anxiety disorder 10/20/2022   MDD (major depressive disorder), recurrent episode, severe (HCC) 10/19/2022   Cannabis abuse 03/09/2022   Allergic rhinitis 10/24/2021   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   History of suicide attempt 04/25/2021   MDD (major depressive disorder), recurrent severe, without psychosis (HCC)    Suicidal ideation    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   Binge eating disorder 02/20/2021   Anxiety associated with depression 08/23/2015   Other chronic allergic conjunctivitis 08/06/2013   Asthma 07/13/2013   Gastroesophageal reflux 06/11/2013   Obesity 06/11/2013   Anemia 04/07/2012   Acquired acanthosis nigricans 04/07/2012    Past Surgical History:  Procedure Laterality Date   ADENOIDECTOMY     TONSILLECTOMY      OB History     Gravida  1   Para       Term      Preterm      AB  1   Living         SAB  1   IAB      Ectopic      Multiple      Live Births               Home Medications    Prior to Admission medications  Medication Sig Start Date End Date Taking? Authorizing Provider  albuterol  (VENTOLIN  HFA) 108 (90 Base) MCG/ACT inhaler Inhale 1-2 puffs into the lungs every 6 (six) hours as needed for wheezing or shortness of breath. 08/06/24   Rolinda Rogue, MD  hydrOXYzine  (ATARAX ) 25 MG tablet Take 1 tablet (25 mg total) by mouth 3 (three) times daily as needed for anxiety. 11/21/23   Nwoko, Uchenna E, PA  predniSONE  (DELTASONE ) 20 MG tablet Take 2 tablets (40 mg total) by mouth daily. 08/06/24   Rolinda Rogue, MD  promethazine -dextromethorphan (PROMETHAZINE -DM) 6.25-15 MG/5ML syrup Take 5 mLs by mouth 4 (four) times daily as needed for cough. 08/06/24   Rolinda Rogue, MD    Family History Family History  Problem Relation Age of Onset   Depression Mother    Vision loss Mother    Hyperlipidemia Mother    Diabetes Mother    Cancer Mother    Alcohol abuse Father    Depression Father  Heart disease Father    Cancer Father     Social History Social History[1]   Allergies   Patient has no known allergies.   Review of Systems Review of Systems  Constitutional:  Positive for fever.  Respiratory:  Positive for cough.   Gastrointestinal:  Positive for vomiting.  All other systems reviewed and are negative.    Physical Exam Triage Vital Signs ED Triage Vitals  Encounter Vitals Group     BP 12/03/24 1705 139/86     Girls Systolic BP Percentile --      Girls Diastolic BP Percentile --      Boys Systolic BP Percentile --      Boys Diastolic BP Percentile --      Pulse Rate 12/03/24 1705 87     Resp 12/03/24 1705 18     Temp 12/03/24 1705 98.3 F (36.8 C)     Temp Source 12/03/24 1705 Oral     SpO2 12/03/24 1705 94 %     Weight --      Height --      Head Circumference --      Peak Flow --       Pain Score 12/03/24 1704 0     Pain Loc --      Pain Education --      Exclude from Growth Chart --    No data found.  Updated Vital Signs BP 139/86 (BP Location: Right Arm)   Pulse 87   Temp 98.3 F (36.8 C) (Oral)   Resp 18   LMP 11/12/2024 (Approximate)   SpO2 94%   Visual Acuity Right Eye Distance:   Left Eye Distance:   Bilateral Distance:    Right Eye Near:   Left Eye Near:    Bilateral Near:     Physical Exam Vitals and nursing note reviewed.  Constitutional:      Appearance: She is well-developed.  HENT:     Head: Normocephalic.  Cardiovascular:     Rate and Rhythm: Normal rate.  Pulmonary:     Effort: Pulmonary effort is normal.  Abdominal:     General: There is no distension.  Musculoskeletal:        General: Normal range of motion.     Cervical back: Normal range of motion.  Skin:    General: Skin is warm.  Neurological:     General: No focal deficit present.     Mental Status: She is alert and oriented to person, place, and time.      UC Treatments / Results  Labs (all labs ordered are listed, but only abnormal results are displayed) Labs Reviewed  POC COVID19/FLU A&B COMBO - Normal    EKG   Radiology No results found.  Procedures Procedures (including critical care time)  Medications Ordered in UC Medications - No data to display  Initial Impression / Assessment and Plan / UC Course  I have reviewed the triage vital signs and the nursing notes.  Pertinent labs & imaging results that were available during my care of the patient were reviewed by me and considered in my medical decision making (see chart for details).     Influenza and COVID are negative Final Clinical Impressions(s) / UC Diagnoses   Final diagnoses:  Cough, unspecified type  Acute upper respiratory infection     Discharge Instructions      Use albuterol  every 4 hours.  Return if any problems.     ED Prescriptions   None  PDMP not reviewed this  encounter. An After Visit Summary was printed and given to the patient.        [1]  Social History Tobacco Use   Smoking status: Never    Passive exposure: Yes   Smokeless tobacco: Never  Vaping Use   Vaping status: Never Used  Substance Use Topics   Alcohol use: No   Drug use: Yes    Types: Marijuana     Flint Sonny POUR, PA-C 12/04/24 0815  "
